# Patient Record
Sex: Male | Born: 1965 | Race: White | Hispanic: No | Marital: Married | Smoking: Never smoker
Health system: Southern US, Community
[De-identification: ages and names within clinical notes are randomized; demographics above are authoritative.]

## PROBLEM LIST (undated history)

## (undated) DIAGNOSIS — D860 Sarcoidosis of lung: Secondary | ICD-10-CM

## (undated) DIAGNOSIS — I472 Ventricular tachycardia, unspecified: Secondary | ICD-10-CM

## (undated) DIAGNOSIS — D8685 Sarcoid myocarditis: Secondary | ICD-10-CM

## (undated) DIAGNOSIS — I469 Cardiac arrest, cause unspecified: Secondary | ICD-10-CM

## (undated) DIAGNOSIS — I442 Atrioventricular block, complete: Secondary | ICD-10-CM

## (undated) DIAGNOSIS — I4510 Unspecified right bundle-branch block: Secondary | ICD-10-CM

## (undated) DIAGNOSIS — N289 Disorder of kidney and ureter, unspecified: Secondary | ICD-10-CM

## (undated) DIAGNOSIS — K219 Gastro-esophageal reflux disease without esophagitis: Secondary | ICD-10-CM

## (undated) DIAGNOSIS — I428 Other cardiomyopathies: Secondary | ICD-10-CM

## (undated) HISTORY — DX: Disorder of kidney and ureter, unspecified: N28.9

## (undated) HISTORY — DX: Unspecified right bundle-branch block: I45.10

---

## 1998-04-14 ENCOUNTER — Encounter: Payer: Self-pay | Admitting: Emergency Medicine

## 1998-04-14 ENCOUNTER — Emergency Department (HOSPITAL_COMMUNITY): Admission: EM | Admit: 1998-04-14 | Discharge: 1998-04-14 | Payer: Self-pay | Admitting: Emergency Medicine

## 2008-07-11 ENCOUNTER — Emergency Department (HOSPITAL_BASED_OUTPATIENT_CLINIC_OR_DEPARTMENT_OTHER): Admission: EM | Admit: 2008-07-11 | Discharge: 2008-07-11 | Payer: Self-pay | Admitting: Emergency Medicine

## 2008-07-11 ENCOUNTER — Ambulatory Visit: Payer: Self-pay | Admitting: Diagnostic Radiology

## 2010-06-15 LAB — URINALYSIS, ROUTINE W REFLEX MICROSCOPIC
Bilirubin Urine: NEGATIVE
Glucose, UA: NEGATIVE mg/dL
Hgb urine dipstick: NEGATIVE
Ketones, ur: NEGATIVE mg/dL
Leukocytes, UA: NEGATIVE
Nitrite: NEGATIVE
Protein, ur: NEGATIVE mg/dL
Specific Gravity, Urine: 1.024 (ref 1.005–1.030)
Urobilinogen, UA: 0.2 mg/dL (ref 0.0–1.0)
pH: 5 (ref 5.0–8.0)

## 2014-03-11 ENCOUNTER — Inpatient Hospital Stay (HOSPITAL_COMMUNITY)
Admission: EM | Admit: 2014-03-11 | Discharge: 2014-03-20 | DRG: 224 | Disposition: A | Discharge status: Home / Self Care | Payer: 59 | Attending: Internal Medicine | Admitting: Internal Medicine

## 2014-03-11 DIAGNOSIS — Z95810 Presence of automatic (implantable) cardiac defibrillator: Secondary | ICD-10-CM

## 2014-03-11 DIAGNOSIS — D869 Sarcoidosis, unspecified: Secondary | ICD-10-CM

## 2014-03-11 DIAGNOSIS — I5021 Acute systolic (congestive) heart failure: Secondary | ICD-10-CM | POA: Diagnosis present

## 2014-03-11 DIAGNOSIS — I213 ST elevation (STEMI) myocardial infarction of unspecified site: Secondary | ICD-10-CM | POA: Diagnosis present

## 2014-03-11 DIAGNOSIS — J189 Pneumonia, unspecified organism: Secondary | ICD-10-CM | POA: Diagnosis present

## 2014-03-11 DIAGNOSIS — I428 Other cardiomyopathies: Secondary | ICD-10-CM

## 2014-03-11 DIAGNOSIS — J9690 Respiratory failure, unspecified, unspecified whether with hypoxia or hypercapnia: Secondary | ICD-10-CM

## 2014-03-11 DIAGNOSIS — R739 Hyperglycemia, unspecified: Secondary | ICD-10-CM | POA: Diagnosis present

## 2014-03-11 DIAGNOSIS — Z8674 Personal history of sudden cardiac arrest: Secondary | ICD-10-CM

## 2014-03-11 DIAGNOSIS — Z452 Encounter for adjustment and management of vascular access device: Secondary | ICD-10-CM

## 2014-03-11 DIAGNOSIS — I469 Cardiac arrest, cause unspecified: Secondary | ICD-10-CM | POA: Diagnosis not present

## 2014-03-11 DIAGNOSIS — D696 Thrombocytopenia, unspecified: Secondary | ICD-10-CM | POA: Diagnosis present

## 2014-03-11 DIAGNOSIS — E874 Mixed disorder of acid-base balance: Secondary | ICD-10-CM | POA: Diagnosis present

## 2014-03-11 DIAGNOSIS — I4510 Unspecified right bundle-branch block: Secondary | ICD-10-CM | POA: Diagnosis present

## 2014-03-11 DIAGNOSIS — G9341 Metabolic encephalopathy: Secondary | ICD-10-CM | POA: Diagnosis present

## 2014-03-11 DIAGNOSIS — K219 Gastro-esophageal reflux disease without esophagitis: Secondary | ICD-10-CM | POA: Diagnosis present

## 2014-03-11 DIAGNOSIS — R1310 Dysphagia, unspecified: Secondary | ICD-10-CM | POA: Diagnosis not present

## 2014-03-11 DIAGNOSIS — F419 Anxiety disorder, unspecified: Secondary | ICD-10-CM | POA: Diagnosis present

## 2014-03-11 DIAGNOSIS — I420 Dilated cardiomyopathy: Secondary | ICD-10-CM | POA: Diagnosis present

## 2014-03-11 DIAGNOSIS — Z823 Family history of stroke: Secondary | ICD-10-CM

## 2014-03-11 DIAGNOSIS — J9811 Atelectasis: Secondary | ICD-10-CM | POA: Diagnosis present

## 2014-03-11 DIAGNOSIS — T380X5A Adverse effect of glucocorticoids and synthetic analogues, initial encounter: Secondary | ICD-10-CM | POA: Diagnosis present

## 2014-03-11 DIAGNOSIS — I442 Atrioventricular block, complete: Secondary | ICD-10-CM | POA: Diagnosis present

## 2014-03-11 DIAGNOSIS — K5900 Constipation, unspecified: Secondary | ICD-10-CM | POA: Diagnosis not present

## 2014-03-11 DIAGNOSIS — E876 Hypokalemia: Secondary | ICD-10-CM | POA: Diagnosis not present

## 2014-03-11 DIAGNOSIS — E871 Hypo-osmolality and hyponatremia: Secondary | ICD-10-CM | POA: Diagnosis not present

## 2014-03-11 DIAGNOSIS — G9340 Encephalopathy, unspecified: Secondary | ICD-10-CM | POA: Insufficient documentation

## 2014-03-11 DIAGNOSIS — J9601 Acute respiratory failure with hypoxia: Secondary | ICD-10-CM | POA: Diagnosis present

## 2014-03-11 DIAGNOSIS — N179 Acute kidney failure, unspecified: Secondary | ICD-10-CM | POA: Diagnosis present

## 2014-03-11 DIAGNOSIS — D860 Sarcoidosis of lung: Secondary | ICD-10-CM | POA: Diagnosis present

## 2014-03-11 DIAGNOSIS — I472 Ventricular tachycardia, unspecified: Secondary | ICD-10-CM

## 2014-03-11 DIAGNOSIS — Z4659 Encounter for fitting and adjustment of other gastrointestinal appliance and device: Secondary | ICD-10-CM

## 2014-03-11 DIAGNOSIS — D8685 Sarcoid myocarditis: Secondary | ICD-10-CM | POA: Diagnosis present

## 2014-03-11 HISTORY — DX: Ventricular tachycardia: I47.2

## 2014-03-11 HISTORY — DX: Sarcoid myocarditis: D86.85

## 2014-03-11 HISTORY — DX: Sarcoidosis of lung: D86.0

## 2014-03-11 HISTORY — DX: Atrioventricular block, complete: I44.2

## 2014-03-11 HISTORY — DX: Ventricular tachycardia, unspecified: I47.20

## 2014-03-11 HISTORY — DX: Other cardiomyopathies: I42.8

## 2014-03-11 HISTORY — DX: Cardiac arrest, cause unspecified: I46.9

## 2014-03-11 HISTORY — DX: Gastro-esophageal reflux disease without esophagitis: K21.9

## 2014-03-11 LAB — I-STAT CG4 LACTIC ACID, ED: LACTIC ACID, VENOUS: 4.33 mmol/L — AB (ref 0.5–2.2)

## 2014-03-11 LAB — CBC WITH DIFFERENTIAL/PLATELET
Basophils Absolute: 0.1 10*3/uL (ref 0.0–0.1)
Basophils Relative: 1 % (ref 0–1)
Eosinophils Absolute: 0.2 10*3/uL (ref 0.0–0.7)
Eosinophils Relative: 2 % (ref 0–5)
HEMATOCRIT: 43.2 % (ref 39.0–52.0)
HEMOGLOBIN: 14.4 g/dL (ref 13.0–17.0)
LYMPHS PCT: 16 % (ref 12–46)
Lymphs Abs: 2 10*3/uL (ref 0.7–4.0)
MCH: 29.6 pg (ref 26.0–34.0)
MCHC: 33.3 g/dL (ref 30.0–36.0)
MCV: 88.9 fL (ref 78.0–100.0)
Monocytes Absolute: 0.7 10*3/uL (ref 0.1–1.0)
Monocytes Relative: 5 % (ref 3–12)
Neutro Abs: 10.1 10*3/uL — ABNORMAL HIGH (ref 1.7–7.7)
Neutrophils Relative %: 76 % (ref 43–77)
Platelets: 189 10*3/uL (ref 150–400)
RBC: 4.86 MIL/uL (ref 4.22–5.81)
RDW: 13.1 % (ref 11.5–15.5)
WBC: 13 10*3/uL — ABNORMAL HIGH (ref 4.0–10.5)

## 2014-03-11 MED ORDER — AMIODARONE HCL IN DEXTROSE 360-4.14 MG/200ML-% IV SOLN
INTRAVENOUS | Status: AC
  Administered 2014-03-12: 200 mL via INTRAVENOUS
  Filled 2014-03-11: qty 200

## 2014-03-11 MED ORDER — ETOMIDATE 2 MG/ML IV SOLN
INTRAVENOUS | Status: AC | PRN
  Administered 2014-03-11: 30 mg via INTRAVENOUS

## 2014-03-11 MED ORDER — ROCURONIUM BROMIDE 50 MG/5ML IV SOLN
INTRAVENOUS | Status: AC | PRN
  Administered 2014-03-11: 100 mg via INTRAVENOUS

## 2014-03-11 MED ORDER — MAGNESIUM SULFATE 2 GM/50ML IV SOLN
2.0000 g | Freq: Once | INTRAVENOUS | Status: DC
Start: 2014-03-12 — End: 2014-03-12

## 2014-03-11 NOTE — ED Notes (Signed)
Pt being bagged at this time.  Pt remains unresponsive  Amino 150mg  given at this time

## 2014-03-11 NOTE — ED Notes (Signed)
Pt having runs of VT per cardiac monitor Endo tube advanced to 25 at lips per resp. Tech. Verbal order by Dr. Preston Fleeting

## 2014-03-11 NOTE — ED Notes (Signed)
Magnesium 2g IV given

## 2014-03-11 NOTE — Consult Note (Signed)
CARDIOLOGY CONSULT NOTE   Patient ID: Christopher Patrick MRN: [id number], DOB/AGE: 06-18-1965   Admit date: 03/11/2014 Date of Consult: 03/11/2014   Primary Physician: No PCP Per Patient Primary Cardiologist: None  Pt. Profile  [name] with anxiety and history of biopsy proven sarcoid and GERD who presents s/p cardiac arrest.   Problem List GERD Sarcoid  Anxiety  Allergies None  HPI   [name] with anxiety and history of biopsy proven sarcoid and GERD who presents s/p cardiac arrest. Christopher Patrick was in his USOH until a day ago when he developed episodes of hand tingling and heart racing.  Today, he generally felt well until 9:30pm when he was seated on the couch and he told his wife "I don't feel like I have control."  Shortly after, he had LOC.  No trauma as he was already on the couch. Wife noticed abnormal breathing and called EMS. A neighbor who was a Theatre stage manager heard the call and came over. He had a rapid pulse at that point.  After about 6 minutes, EMS arrived and shortly after he became apneic and pulseless and CPR was started immediately.  He underwent CPR for 20 minutes and required 3 shocks; he was given 2mg  of epi and 150mg  of IV amiodarone. He was transferred to [facility] ED with a king airway.   On arrival to the ED, he was hemodynamically stable with pulse 92, BP 111/72, saturating 100%.  King airway switched for ETT. Initial labs notable for K 4.1, TnI 0.08, lactate 4.33. He had recurrent runs of VT which was treated with another 150mg  IV amiodarone bolus.  Initial ECG with substantial ventricular ectopy without STE. A follow-up ECG at 00:11 demonstrated marked 1st degree HB ( ), RBBB, LPFB. There was also evidence of more advanced AV block -- there is a non-conducted P wave followed by a junctional beat (2nd to last QRS complex). Due to recurrent runs of VT in the ED (none of which required shock), I recommended that lidocaine be started and this resulted in a significant decrease  in VT initially.  He has no CV history. Wife is not aware of cardiac sarcoid; she is not aware of any sarcoid treatment history or specific organ involvement except for apparently lung (?), but states that he has periodically had SOB. No personal history of syncope or presyncope. At least two paternal uncles died suddenly in their 33s of unknown causes. No illicits.  Inpatient Medications     Family History No family history on file.   Social History History   Social History  . Marital Status: Married    Spouse Name: N/A    Number of Children: N/A  . Years of Education: N/A   Occupational History  . Not on file.   Social History Main Topics  . Smoking status: Not on file  . Smokeless tobacco: Not on file  . Alcohol Use: Not on file  . Drug Use: Not on file  . Sexual Activity: Not on file   Other Topics Concern  . Not on file   Social History Narrative  . No narrative on file     Review of Systems  Unable to obtain  Physical Exam  Blood pressure 202/186, pulse 54, temperature 97 F (36.1 C), resp. rate 16, weight 99.791 kg (220 lb), SpO2 97 %.  General: Intubated, not responding Psych: Obtunded Neuro: Obtunded. No spontaneous movement. PERRL HEENT: ETT. Some blood around ETT.  Neck: Supple without bruits or JVD. Lungs:  Coarse BS anteriorly Heart: RRR no s3, s4, or murmurs. Abdomen: Soft, non-tender, non-distended, BS + x 4.  Extremities: No clubbing, cyanosis or edema. DP/PT/Radials 2+ and equal bilaterally. Cool  Labs   Recent Labs  03/11/14 2337  TROPONINI 0.08*   Lab Results  Component Value Date   WBC 13.0* 03/11/2014   HGB 14.4 03/11/2014   HCT 43.2 03/11/2014   MCV 88.9 03/11/2014   PLT 189 03/11/2014   No results for input(s): NA, K, CL, CO2, BUN, CREATININE, CALCIUM, PROT, BILITOT, ALKPHOS, ALT, AST, GLUCOSE in the last 168 hours.  Invalid input(s): LABALBU No results found for: CHOL, HDL, LDLCALC, TRIG No results found for:  DDIMER  Radiology/Studies  Dg Chest Port 1 View  03/12/2014   CLINICAL DATA:  Post CPR to assess endotracheal tube.  EXAM: PORTABLE CHEST - 1 VIEW  COMPARISON:  None.  FINDINGS: Two films were obtained, initial image demonstrating endotracheal tube tip 9 cm above the carina and second image demonstrating endotracheal tube tip 3.9 cm above the carina. Enteric tube tip is at the level of the EG junction with proximal side hole probably in the lower esophagus. Shallow inspiration. Cardiac enlargement with normal pulmonary vascularity. Focal opacity in the left mid lung consistent with infiltration or atelectasis. No blunting of costophrenic angles. No pneumothorax. Nodule in the right mid lung probably represents a calcified granuloma.  IMPRESSION: Endotracheal tube appears to be in good position. Enteric tube is somewhat shallow with proximal side hole probably in the distal esophageal region. Cardiac enlargement. Infiltration or atelectasis in the left mid lung.   Electronically Signed   By: Burman Nieves M.D.   On: 03/12/2014 00:13    ECG  00:11 marked 1st degree HB ( ) , RBBB, LPFB, PVC. There is a also evidence of more advanced AV block -- there is a non-conducted P wave followed by a junctional beat (2nd to last QRS complex)  ASSESSMENT AND PLAN  [name] with anxiety and history of biopsy proven sarcoid and GERD who presents s/p cardiac arrest due to a ventricular arrhythmia.  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  He may also have coronary artery disease (in combination or as the sole explanation), although he does not have much in the way of risk factors. The history of premature death on the father's side suggests an inherited etiology; this would not be inconsistent with sarcoid, which is known to have some genetic underpinnings.   He is  currently having frequent runs of VT and remains obtunded without any sedation.  Although prognosis remains guarded, his age and early bystander CPR are favorable prognostic indicators.   1. Cooling protocol per CCM 2. Continue amiodarone and lidocaine drips for VT. I would sedate the patient heavily to remove adrenergic tone which is likely driving these residual runs of VT. I am hesitant to recommend a beta blocker given the brief periods of complete heart block.  Since amiodarone is more likely to affect AV conduction than lidocaine, this should be weaned from 1 to 0.5 if possible (after 6 hours and with complete sedation). If refractory VT, he will require cardiac cath +/- IABP tonight. K>4, Mg>2. 3. TTE in AM 4. Trend cardiac biomarkers 5. Hold off on heparin for now given oropharyngeal bleeding and suspicion for cardiac sarcoid rather than ACS. Would give full dose ASA and baby aspirin.  Hold statin for now given possibility of shock liver in setting of cardiac arrest. 6. Will eventually require a cath +/- ICD depending on recovery and clinical course   Signed, Glori Luis, MD 03/11/2014, 11:53 PM

## 2014-03-11 NOTE — ED Provider Notes (Signed)
CSN: [id number]     Arrival date & time 03/11/14  2324 History  This chart was scribed for Dione Booze, MD by Richarda Overlie, ED Scribe. This patient was seen in room [facility] and the patient's care was started 11:23 PM.    Chief Complaint  Patient presents with  . Code STEMI   LEVEL 5 CAVEAT - Intubated  The history is provided by the EMS personnel. The history is limited by the condition of the patient (Intubated and unresponsive). No language interpreter was used.   HPI Comments: Christopher Patrick is a 49 y.o. male who presents to the Emergency Department with a post arrest STEMI that about 1 hour ago. Per EMS, his wife stepped out of the room and when she returned he was on the floor. They state that pt had been working outside all day today. EMS reports that they conducted CPR for approximately 15 minutes. Pt was shocked by defibrillator a total of 3 times. EMS reports they administered epi two times. On arrival to ED pt being bagged via king airway. Per EMS pt is in sinus arhythmia currently.   No past medical history on file. No past surgical history on file. No family history on file. History  Substance Use Topics  . Smoking status: Not on file  . Smokeless tobacco: Not on file  . Alcohol Use: Not on file    Review of Systems  Unable to perform ROS: Intubated    Allergies  Review of patient's allergies indicates not on file.  Home Medications   Prior to Admission medications   Not on File   BP 111/72 mmHg  Resp 19  SpO2 100% Physical Exam   49 year old male, unresponsive and intubated with a King airway. Vital signs are normal. Oxygen saturation is 100%, which is normal. Head is normocephalic. King airway is in place and blood is coming from the mouth.  pupils are midposition and fixed. Neck has no  adenopathy or JVD. have symmetric air movement. Chehas no deformity or crepitus . Heart has regular rate and rhythm without murmur. Abdomen is soft, flat,  without masses or hepatosplenomegaly. Extremities have no cyanosis or edema. Intraosseous line is present in the left proximal tibia. Skin is warm and dry without rash. Neurologic: GCS=4 - he shows some decerebrate posturing and some shivering and no other neurologic signs of life.  ED Course  Procedures  DIAGNOSTIC STUDIES: Oxygen Saturation is 100% on bag via king airway, adequate by my interpretation.    COORDINATION OF CARE: 11:44 PM Discussed treatment plan with pt at bedside and pt agreed to plan.   King airway was replaced by endotracheal tube. This was done by emergency medicine resident with under my direct supervision. I was at the bedside for the entire procedure.  Labs Review Results for orders placed or performed during the hospital encounter of 03/11/14  MRSA PCR Screening  Result Value Ref Range   MRSA by PCR NEGATIVE NEGATIVE  Basic metabolic panel  Result Value Ref Range   Sodium 140 135 - 145 mmol/L   Potassium 4.1 3.5 - 5.1 mmol/L   Chloride 107 96 - 112 mEq/L   CO2 22 19 - 32 mmol/L   Glucose, Bld 173 (H) 70 - 99 mg/dL   BUN 15 6 - 23 mg/dL   Creatinine, Ser 4.09 (H) 0.50 - 1.35 mg/dL   Calcium 8.8 8.4 - 81.1 mg/dL   GFR calc non Af Amer 53 (L) >90 mL/min  GFR calc Af Amer 61 (L) >90 mL/min   Anion gap 11 5 - 15  Troponin I  Result Value Ref Range   Troponin I 0.08 (H) <0.031 ng/mL  CBC with Differential  Result Value Ref Range   WBC 13.0 (H) 4.0 - 10.5 K/uL   RBC 4.86 4.22 - 5.81 MIL/uL   Hemoglobin 14.4 13.0 - 17.0 g/dL   HCT 96.0 45.4 - 09.8 %   MCV 88.9 78.0 - 100.0 fL   MCH 29.6 26.0 - 34.0 pg   MCHC 33.3 30.0 - 36.0 g/dL   RDW 11.9 14.7 - 82.9 %   Platelets 189 150 - 400 K/uL   Neutrophils Relative % 76 43 - 77 %   Neutro Abs 10.1 (H) 1.7 - 7.7 K/uL   Lymphocytes Relative 16 12 - 46 %   Lymphs Abs 2.0 0.7 - 4.0 K/uL   Monocytes Relative 5 3 - 12 %   Monocytes Absolute 0.7 0.1 - 1.0 K/uL   Eosinophils Relative 2 0 - 5 %   Eosinophils  Absolute 0.2 0.0 - 0.7 K/uL   Basophils Relative 1 0 - 1 %   Basophils Absolute 0.1 0.0 - 0.1 K/uL  Magnesium  Result Value Ref Range   Magnesium 2.0 1.5 - 2.5 mg/dL  Urinalysis, Routine w reflex microscopic  Result Value Ref Range   Color, Urine YELLOW YELLOW   APPearance CLOUDY (A) CLEAR   Specific Gravity, Urine 1.011 1.005 - 1.030   pH 6.5 5.0 - 8.0   Glucose, UA NEGATIVE NEGATIVE mg/dL   Hgb urine dipstick SMALL (A) NEGATIVE   Bilirubin Urine NEGATIVE NEGATIVE   Ketones, ur NEGATIVE NEGATIVE mg/dL   Protein, ur 562 (A) NEGATIVE mg/dL   Urobilinogen, UA 0.2 0.0 - 1.0 mg/dL   Nitrite NEGATIVE NEGATIVE   Leukocytes, UA NEGATIVE NEGATIVE  Urine microscopic-add on  Result Value Ref Range   Squamous Epithelial / LPF RARE RARE   WBC, UA 0-2 <3 WBC/hpf   RBC / HPF 3-6 <3 RBC/hpf   Bacteria, UA FEW (A) RARE   Casts GRANULAR CAST (A) NEGATIVE   Urine-Other SPERM PRESENT   Troponin I  Result Value Ref Range   Troponin I 0.44 (H) <0.031 ng/mL  Protime-INR now and repeat in 8 hours  Result Value Ref Range   Prothrombin Time 15.1 11.6 - 15.2 seconds   INR 1.17 0.00 - 1.49  APTT now and repeat in 8 hours  Result Value Ref Range   aPTT 34 24 - 37 seconds  Basic metabolic panel  Result Value Ref Range   Sodium 139 135 - 145 mmol/L   Potassium 3.2 (L) 3.5 - 5.1 mmol/L   Chloride 106 96 - 112 mEq/L   CO2 21 19 - 32 mmol/L   Glucose, Bld 204 (H) 70 - 99 mg/dL   BUN 14 6 - 23 mg/dL   Creatinine, Ser 1.30 0.50 - 1.35 mg/dL   Calcium 8.3 (L) 8.4 - 10.5 mg/dL   GFR calc non Af Amer 82 (L) >90 mL/min   GFR calc Af Amer >90 >90 mL/min   Anion gap 12 5 - 15  Basic metabolic panel  Result Value Ref Range   Sodium 138 135 - 145 mmol/L   Potassium 3.2 (L) 3.5 - 5.1 mmol/L   Chloride 107 96 - 112 mEq/L   CO2 19 19 - 32 mmol/L   Glucose, Bld 209 (H) 70 - 99 mg/dL   BUN 14 6 - 23  mg/dL   Creatinine, Ser 8.50 0.50 - 1.35 mg/dL   Calcium 8.4 8.4 - 27.7 mg/dL   GFR calc non Af Amer 82  (L) >90 mL/min   GFR calc Af Amer >90 >90 mL/min   Anion gap 12 5 - 15  Basic metabolic panel  Result Value Ref Range   Sodium 136 135 - 145 mmol/L   Potassium 3.8 3.5 - 5.1 mmol/L   Chloride 105 96 - 112 mEq/L   CO2 20 19 - 32 mmol/L   Glucose, Bld 187 (H) 70 - 99 mg/dL   BUN 14 6 - 23 mg/dL   Creatinine, Ser 4.12 0.50 - 1.35 mg/dL   Calcium 8.1 (L) 8.4 - 10.5 mg/dL   GFR calc non Af Amer 76 (L) >90 mL/min   GFR calc Af Amer 88 (L) >90 mL/min   Anion gap 11 5 - 15  Lactic acid, plasma  Result Value Ref Range   Lactic Acid, Venous 2.6 (H) 0.5 - 2.2 mmol/L  Glucose, capillary  Result Value Ref Range   Glucose-Capillary 174 (H) 70 - 99 mg/dL   Comment 1 Capillary Sample   Glucose, capillary  Result Value Ref Range   Glucose-Capillary 192 (H) 70 - 99 mg/dL   Comment 1 Arterial Sample   Glucose, capillary  Result Value Ref Range   Glucose-Capillary 210 (H) 70 - 99 mg/dL   Comment 1 Arterial Sample   Glucose, capillary  Result Value Ref Range   Glucose-Capillary 180 (H) 70 - 99 mg/dL   Comment 1 Arterial Sample   Blood gas, arterial  Result Value Ref Range   FIO2 1.00 %   Delivery systems VENTILATOR    Mode PRESSURE REGULATED VOLUME CONTROL    VT 620 mL   Rate 20 resp/min   Peep/cpap 5.0 cm H20   pH, Arterial 7.449 7.350 - 7.450   pCO2 arterial 31.5 (L) 35.0 - 45.0 mmHg   pO2, Arterial 222.0 (H) 80.0 - 100.0 mmHg   Bicarbonate 22.0 20.0 - 24.0 mEq/L   TCO2 23.0 0 - 100 mmol/L   Acid-base deficit 1.8 0.0 - 2.0 mmol/L   O2 Saturation 99.7 %   Patient temperature 95.5    Collection site ARTERIAL LINE    Drawn by [id number]    Sample type ARTERIAL DRAW   I-Stat CG4 Lactic Acid, ED  Result Value Ref Range   Lactic Acid, Venous 4.33 (H) 0.5 - 2.2 mmol/L  I-Stat arterial blood gas, ED  Result Value Ref Range   pH, Arterial 7.308 (L) 7.350 - 7.450   pCO2 arterial 47.9 (H) 35.0 - 45.0 mmHg   pO2, Arterial 82.0 80.0 - 100.0 mmHg   Bicarbonate 24.1 (H) 20.0 - 24.0 mEq/L    TCO2 26 0 - 100 mmol/L   O2 Saturation 95.0 %   Acid-base deficit 3.0 (H) 0.0 - 2.0 mmol/L   Patient temperature 97.9 F    Collection site RADIAL, ALLEN'S TEST ACCEPTABLE    Drawn by RT    Sample type ARTERIAL   I-STAT 3, arterial blood gas (G3+)  Result Value Ref Range   pH, Arterial 7.343 (L) 7.350 - 7.450   pCO2 arterial 35.3 35.0 - 45.0 mmHg   pO2, Arterial 63.0 (L) 80.0 - 100.0 mmHg   Bicarbonate 19.6 (L) 20.0 - 24.0 mEq/L   TCO2 21 0 - 100 mmol/L   O2 Saturation 93.0 %   Acid-base deficit 6.0 (H) 0.0 - 2.0 mmol/L   Patient temperature 95.5 F  Collection site RADIAL, ALLEN'S TEST ACCEPTABLE    Drawn by Operator    Sample type ARTERIAL     Imaging Review Dg Chest Port 1 View  03/12/2014   CLINICAL DATA:  Central line placement.  EXAM: PORTABLE CHEST - 1 VIEW  COMPARISON:  03/11/2014  FINDINGS: Endotracheal tube tip measures 5.3 cm above the carinal. Enteric tube tip projects over the EG junction. Interval placement of a left central venous catheter with tip over the cavoatrial junction. No pneumothorax. Persistent cardiac enlargement and perihilar infiltrates.  IMPRESSION: Left central venous catheter with tip over the cavoatrial junction. No pneumothorax. Enteric tube tip remains at the level of the EG junction. Cardiac enlargement. Bilateral perihilar infiltrates.   Electronically Signed   By: Burman Nieves M.D.   On: 03/12/2014 03:23   Dg Chest Port 1 View  03/12/2014   CLINICAL DATA:  Post CPR to assess endotracheal tube.  EXAM: PORTABLE CHEST - 1 VIEW  COMPARISON:  None.  FINDINGS: Two films were obtained, initial image demonstrating endotracheal tube tip 9 cm above the carina and second image demonstrating endotracheal tube tip 3.9 cm above the carina. Enteric tube tip is at the level of the EG junction with proximal side hole probably in the lower esophagus. Shallow inspiration. Cardiac enlargement with normal pulmonary vascularity. Focal opacity in the left mid lung  consistent with infiltration or atelectasis. No blunting of costophrenic angles. No pneumothorax. Nodule in the right mid lung probably represents a calcified granuloma.  IMPRESSION: Endotracheal tube appears to be in good position. Enteric tube is somewhat shallow with proximal side hole probably in the distal esophageal region. Cardiac enlargement. Infiltration or atelectasis in the left mid lung.   Electronically Signed   By: Burman Nieves M.D.   On: 03/12/2014 00:13   Dg Abd Portable 1v  03/12/2014   CLINICAL DATA:  OG placement.  EXAM: PORTABLE ABDOMEN - 1 VIEW  COMPARISON:  None.  FINDINGS: Enteric tube tip projects over the EG junction region with proximal side hole projecting over the lower esophagus. Cardiac enlargement. Infiltrates noted in the lower lungs.  IMPRESSION: Enteric tube tip projects over the EG junction with proximal side hole probably in the lower esophagus.   Electronically Signed   By: Burman Nieves M.D.   On: 03/12/2014 03:25   Ct Portable Head W/o Cm  03/12/2014   CLINICAL DATA:  Status post CPR, cardiac arrest. Acute encephalopathy.  EXAM: CT HEAD WITHOUT CONTRAST  TECHNIQUE: Contiguous axial images were obtained from the base of the skull through the vertex without intravenous contrast.  COMPARISON:  None.  FINDINGS: The ventricles and sulci are normal. Somewhat poor gray-white matter differentiation though, there is streak artifact from life support lines. No intraparenchymal hemorrhage, mass effect nor midline shift. No acute large vascular territory infarcts.  No abnormal extra-axial fluid collections. Basal cisterns are patent.  No skull fracture. The included ocular globes and orbital contents are non-suspicious. Bilateral maxillary mucosal retention cyst, moderate paranasal sinusitis. Mastoid air cells are well aerated.  IMPRESSION: Mild loss of the expected gray-white matter differentiation, this could be artifact or reflect very early global brain edema/ anoxia.  Recommend followup.   Electronically Signed   By: Awilda Metro   On: 03/12/2014 06:00   Images viewed by me.   EKG Interpretation   Date/Time:  Tuesday March 11 2014 23:29:30 EST Ventricular Rate:  96 PR Interval:    QRS Duration: 162 QT Interval:  545 QTC Calculation: 689 R Axis:  142 Text Interpretation:  Atrial fibrillation Ventricular tachycardia,  unsustained Nonspecific intraventricular conduction delay Minimal ST  depression Right bundle branch block No old tracing to compare Confirmed  by [facility]  MD, Shiah Berhow ([id number]) on 03/12/2014 8:04:46 AM       EKG Interpretation   Date/Time:  Wednesday March 12 2014 00:11:00 EST Ventricular Rate:  65 PR Interval:  317 QRS Duration: 149 QT Interval:  417 QTC Calculation: 434 R Axis:   125 Text Interpretation:  Sinus or ectopic atrial rhythm Prolonged PR interval  Nonspecific intraventricular conduction delay Nonspecific ST depression ST  depression V1-V3, suggest recording posterior leads When compared with ECG  of 03/11/2014, Left posterior fasicular block is now Present Confirmed by  [facility]  MD, Kristofer Schaffert ([id number]) on 03/12/2014 8:07:13 AM      Medical screening examination/treatment/procedure(s) were conducted as a shared visit with non-physician practitioner(s) and myself.  I personally evaluated the patient during the encounter.   EKG Interpretation   Date/Time:  Wednesday March 12 2014 00:11:00 EST Ventricular Rate:  65 PR Interval:  317 QRS Duration: 149 QT Interval:  417 QTC Calculation: 434 R Axis:   125 Text Interpretation:  Sinus or ectopic atrial rhythm Prolonged PR interval  Nonspecific intraventricular conduction delay Nonspecific ST depression ST  depression V1-V3, suggest recording posterior leads When compared with ECG  of 03/11/2014, Left posterior fasicular block is now Present Confirmed by  [facility]  MD, Brodyn Depuy ([id number]) on 03/12/2014 8:07:13 AM       CRITICAL CARE Performed by: BJYNW,GNFAO Total critical  care time: 120 minutes Critical care time was exclusive of separately billable procedures and treating other patients. Critical care was necessary to treat or prevent imminent or life-threatening deterioration. Critical care was time spent personally by me on the following activities: development of treatment plan with patient and/or surrogate as well as nursing, discussions with consultants, evaluation of patient's response to treatment, examination of patient, obtaining history from patient or surrogate, ordering and performing treatments and interventions, ordering and review of laboratory studies, ordering and review of radiographic studies, pulse oximetry and re-evaluation of patient's condition.  MDM   Final diagnoses:  Cardiac arrest  Out of hospital cardiac arrest with respect tenderness spontaneous circulation. EMS had given him to epinephrine and shocked him 3 times and also given amiodarone 150 mg. Total time of CPR was about 20 minutes. In the ED, Brooke Dare airway was pulled out and endotracheal tube placed. He is noted to have runs of sustained and nonsustained ventricular tachycardia. He is given additional amiodarone. ECG shows QT prolongation is given magnesium. Consultation is obtained with cardiology and pulmonary critical care. Cardiology has been in to see the patient and has started him on lidocaine as well as the amiodarone. I have talked with family and apparently he has been having some GI symptoms and was started on a proton pump inhibitor 2 days ago. He had been complaining of some numbness in his arms intermittently over the last one-2 days. There is family history of stroke and aneurysm at about this age but he has no prior history.  Patient continued to have problems with arrhythmia including fairly long runs of ventricular tachycardia. These were generally perfusing and it was elected not to try to convert him electrically. Lidocaine was increased. Cases and managed concurrently  with cardiology and critical care.  I personally performed the services described in this documentation, which was scribed in my presence. The recorded information has been reviewed and is accurate.  Dione Booze, MD 03/12/14 [id number]

## 2014-03-11 NOTE — ED Notes (Signed)
King Airway removed, Pt intubated with #8.0 endo tube

## 2014-03-11 NOTE — ED Notes (Signed)
Pt to ED via [facility] with post arrest stemi pt.  EMS st's wife st's she walked out of room when she returned pt was on floor.  St's wife st's pt had been working outside all day. Pt was given CPR for 15 mins. Pt was shocked x's 2 and given Epi x's 2.  On arrival to ED pt being bagged via king airway.

## 2014-03-12 ENCOUNTER — Inpatient Hospital Stay (HOSPITAL_COMMUNITY): Payer: 59

## 2014-03-12 ENCOUNTER — Emergency Department (HOSPITAL_COMMUNITY): Payer: 59

## 2014-03-12 ENCOUNTER — Encounter (HOSPITAL_COMMUNITY)
Admission: EM | Disposition: A | Discharge status: Home / Self Care | Payer: 59 | Source: Home / Self Care | Attending: Pulmonary Disease

## 2014-03-12 ENCOUNTER — Other Ambulatory Visit (HOSPITAL_COMMUNITY): Payer: 59

## 2014-03-12 ENCOUNTER — Encounter (HOSPITAL_COMMUNITY): Payer: Self-pay | Admitting: Emergency Medicine

## 2014-03-12 DIAGNOSIS — E874 Mixed disorder of acid-base balance: Secondary | ICD-10-CM | POA: Diagnosis present

## 2014-03-12 DIAGNOSIS — J9601 Acute respiratory failure with hypoxia: Secondary | ICD-10-CM

## 2014-03-12 DIAGNOSIS — R739 Hyperglycemia, unspecified: Secondary | ICD-10-CM | POA: Diagnosis present

## 2014-03-12 DIAGNOSIS — D8685 Sarcoid myocarditis: Secondary | ICD-10-CM | POA: Diagnosis present

## 2014-03-12 DIAGNOSIS — I469 Cardiac arrest, cause unspecified: Secondary | ICD-10-CM | POA: Diagnosis present

## 2014-03-12 DIAGNOSIS — F419 Anxiety disorder, unspecified: Secondary | ICD-10-CM | POA: Diagnosis present

## 2014-03-12 DIAGNOSIS — I5021 Acute systolic (congestive) heart failure: Secondary | ICD-10-CM | POA: Diagnosis present

## 2014-03-12 DIAGNOSIS — I428 Other cardiomyopathies: Secondary | ICD-10-CM

## 2014-03-12 DIAGNOSIS — N179 Acute kidney failure, unspecified: Secondary | ICD-10-CM | POA: Diagnosis present

## 2014-03-12 DIAGNOSIS — I472 Ventricular tachycardia, unspecified: Secondary | ICD-10-CM

## 2014-03-12 DIAGNOSIS — E876 Hypokalemia: Secondary | ICD-10-CM | POA: Diagnosis not present

## 2014-03-12 DIAGNOSIS — I4510 Unspecified right bundle-branch block: Secondary | ICD-10-CM | POA: Diagnosis present

## 2014-03-12 DIAGNOSIS — I4589 Other specified conduction disorders: Secondary | ICD-10-CM

## 2014-03-12 DIAGNOSIS — K5900 Constipation, unspecified: Secondary | ICD-10-CM | POA: Diagnosis not present

## 2014-03-12 DIAGNOSIS — D860 Sarcoidosis of lung: Secondary | ICD-10-CM | POA: Diagnosis present

## 2014-03-12 DIAGNOSIS — G931 Anoxic brain damage, not elsewhere classified: Secondary | ICD-10-CM

## 2014-03-12 DIAGNOSIS — J9811 Atelectasis: Secondary | ICD-10-CM | POA: Diagnosis present

## 2014-03-12 DIAGNOSIS — I429 Cardiomyopathy, unspecified: Secondary | ICD-10-CM

## 2014-03-12 DIAGNOSIS — I213 ST elevation (STEMI) myocardial infarction of unspecified site: Secondary | ICD-10-CM | POA: Diagnosis present

## 2014-03-12 DIAGNOSIS — E871 Hypo-osmolality and hyponatremia: Secondary | ICD-10-CM | POA: Diagnosis not present

## 2014-03-12 DIAGNOSIS — R1310 Dysphagia, unspecified: Secondary | ICD-10-CM | POA: Diagnosis not present

## 2014-03-12 DIAGNOSIS — T380X5A Adverse effect of glucocorticoids and synthetic analogues, initial encounter: Secondary | ICD-10-CM | POA: Diagnosis present

## 2014-03-12 DIAGNOSIS — D696 Thrombocytopenia, unspecified: Secondary | ICD-10-CM | POA: Diagnosis present

## 2014-03-12 DIAGNOSIS — Z8674 Personal history of sudden cardiac arrest: Secondary | ICD-10-CM | POA: Diagnosis not present

## 2014-03-12 DIAGNOSIS — D869 Sarcoidosis, unspecified: Secondary | ICD-10-CM

## 2014-03-12 DIAGNOSIS — J189 Pneumonia, unspecified organism: Secondary | ICD-10-CM | POA: Diagnosis present

## 2014-03-12 DIAGNOSIS — I442 Atrioventricular block, complete: Secondary | ICD-10-CM | POA: Diagnosis present

## 2014-03-12 DIAGNOSIS — G9341 Metabolic encephalopathy: Secondary | ICD-10-CM | POA: Diagnosis present

## 2014-03-12 DIAGNOSIS — K219 Gastro-esophageal reflux disease without esophagitis: Secondary | ICD-10-CM | POA: Diagnosis present

## 2014-03-12 DIAGNOSIS — I420 Dilated cardiomyopathy: Secondary | ICD-10-CM | POA: Diagnosis present

## 2014-03-12 DIAGNOSIS — Z823 Family history of stroke: Secondary | ICD-10-CM | POA: Diagnosis not present

## 2014-03-12 HISTORY — PX: LEFT HEART CATHETERIZATION WITH CORONARY ANGIOGRAM: SHX5451

## 2014-03-12 LAB — POCT I-STAT 3, ART BLOOD GAS (G3+)
ACID-BASE DEFICIT: 6 mmol/L — AB (ref 0.0–2.0)
Bicarbonate: 19.6 mEq/L — ABNORMAL LOW (ref 20.0–24.0)
O2 Saturation: 93 %
PO2 ART: 63 mmHg — AB (ref 80.0–100.0)
TCO2: 21 mmol/L (ref 0–100)
pCO2 arterial: 35.3 mmHg (ref 35.0–45.0)
pH, Arterial: 7.343 — ABNORMAL LOW (ref 7.350–7.450)

## 2014-03-12 LAB — POCT I-STAT, CHEM 8
BUN: 20 mg/dL (ref 6–23)
CALCIUM ION: 1.16 mmol/L (ref 1.12–1.23)
CHLORIDE: 101 meq/L (ref 96–112)
Creatinine, Ser: 1.5 mg/dL — ABNORMAL HIGH (ref 0.50–1.35)
Glucose, Bld: 167 mg/dL — ABNORMAL HIGH (ref 70–99)
HCT: 44 % (ref 39.0–52.0)
Hemoglobin: 15 g/dL (ref 13.0–17.0)
POTASSIUM: 3.7 mmol/L (ref 3.5–5.1)
SODIUM: 142 mmol/L (ref 135–145)
TCO2: 21 mmol/L (ref 0–100)

## 2014-03-12 LAB — CBC
HEMATOCRIT: 49.5 % (ref 39.0–52.0)
HEMOGLOBIN: 17.5 g/dL — AB (ref 13.0–17.0)
MCH: 30.4 pg (ref 26.0–34.0)
MCHC: 35.4 g/dL (ref 30.0–36.0)
MCV: 86.1 fL (ref 78.0–100.0)
PLATELETS: 209 10*3/uL (ref 150–400)
RBC: 5.75 MIL/uL (ref 4.22–5.81)
RDW: 13.1 % (ref 11.5–15.5)
WBC: 14.3 10*3/uL — AB (ref 4.0–10.5)

## 2014-03-12 LAB — BASIC METABOLIC PANEL
ANION GAP: 12 (ref 5–15)
ANION GAP: 16 — AB (ref 5–15)
Anion gap: 11 (ref 5–15)
Anion gap: 11 (ref 5–15)
Anion gap: 12 (ref 5–15)
Anion gap: 14 (ref 5–15)
Anion gap: 16 — ABNORMAL HIGH (ref 5–15)
Anion gap: 17 — ABNORMAL HIGH (ref 5–15)
BUN: 14 mg/dL (ref 6–23)
BUN: 14 mg/dL (ref 6–23)
BUN: 14 mg/dL (ref 6–23)
BUN: 15 mg/dL (ref 6–23)
BUN: 15 mg/dL (ref 6–23)
BUN: 16 mg/dL (ref 6–23)
BUN: 18 mg/dL (ref 6–23)
BUN: 18 mg/dL (ref 6–23)
CALCIUM: 8.4 mg/dL (ref 8.4–10.5)
CALCIUM: 8.4 mg/dL (ref 8.4–10.5)
CALCIUM: 7.1 mg/dL — AB (ref 8.4–10.5)
CHLORIDE: 105 meq/L (ref 96–112)
CHLORIDE: 107 meq/L (ref 96–112)
CHLORIDE: 107 meq/L (ref 96–112)
CO2: 12 mmol/L — AB (ref 19–32)
CO2: 14 mmol/L — ABNORMAL LOW (ref 19–32)
CO2: 16 mmol/L — AB (ref 19–32)
CO2: 17 mmol/L — AB (ref 19–32)
CO2: 19 mmol/L (ref 19–32)
CO2: 20 mmol/L (ref 19–32)
CO2: 21 mmol/L (ref 19–32)
CO2: 22 mmol/L (ref 19–32)
CREATININE: 1.04 mg/dL (ref 0.50–1.35)
CREATININE: 1.11 mg/dL (ref 0.50–1.35)
CREATININE: 1.06 mg/dL (ref 0.50–1.35)
CREATININE: 1.26 mg/dL (ref 0.50–1.35)
Calcium: 8.1 mg/dL — ABNORMAL LOW (ref 8.4–10.5)
Calcium: 8.2 mg/dL — ABNORMAL LOW (ref 8.4–10.5)
Calcium: 8.3 mg/dL — ABNORMAL LOW (ref 8.4–10.5)
Calcium: 8.4 mg/dL (ref 8.4–10.5)
Calcium: 8.8 mg/dL (ref 8.4–10.5)
Chloride: 106 mEq/L (ref 96–112)
Chloride: 107 mEq/L (ref 96–112)
Chloride: 110 mEq/L (ref 96–112)
Chloride: 102 mEq/L (ref 96–112)
Chloride: 101 mEq/L (ref 96–112)
Creatinine, Ser: 0.92 mg/dL (ref 0.50–1.35)
Creatinine, Ser: 1.04 mg/dL (ref 0.50–1.35)
Creatinine, Ser: 1.26 mg/dL (ref 0.50–1.35)
Creatinine, Ser: 1.5 mg/dL — ABNORMAL HIGH (ref 0.50–1.35)
GFR calc Af Amer: 61 mL/min — ABNORMAL LOW (ref >90)
GFR calc Af Amer: 75 mL/min — ABNORMAL LOW (ref >90)
GFR calc Af Amer: >90 mL/min (ref >90)
GFR calc non Af Amer: 82 mL/min — ABNORMAL LOW (ref >90)
GFR calc non Af Amer: 82 mL/min — ABNORMAL LOW (ref >90)
GFR calc non Af Amer: 76 mL/min — ABNORMAL LOW (ref >90)
GFR calc non Af Amer: 80 mL/min — ABNORMAL LOW (ref >90)
GFR calc non Af Amer: 65 mL/min — ABNORMAL LOW (ref >90)
GFR, EST AFRICAN AMERICAN: 88 mL/min — AB (ref >90)
GFR, EST AFRICAN AMERICAN: 75 mL/min — AB (ref >90)
GFR, EST NON AFRICAN AMERICAN: 53 mL/min — AB (ref >90)
GFR, EST NON AFRICAN AMERICAN: 65 mL/min — AB (ref >90)
GLUCOSE: 187 mg/dL — AB (ref 70–99)
GLUCOSE: 219 mg/dL — AB (ref 70–99)
GLUCOSE: 241 mg/dL — AB (ref 70–99)
Glucose, Bld: 147 mg/dL — ABNORMAL HIGH (ref 70–99)
Glucose, Bld: 158 mg/dL — ABNORMAL HIGH (ref 70–99)
Glucose, Bld: 173 mg/dL — ABNORMAL HIGH (ref 70–99)
Glucose, Bld: 204 mg/dL — ABNORMAL HIGH (ref 70–99)
Glucose, Bld: 209 mg/dL — ABNORMAL HIGH (ref 70–99)
POTASSIUM: 3.2 mmol/L — AB (ref 3.5–5.1)
POTASSIUM: 3.8 mmol/L (ref 3.5–5.1)
Potassium: 3.2 mmol/L — ABNORMAL LOW (ref 3.5–5.1)
Potassium: 3.2 mmol/L — ABNORMAL LOW (ref 3.5–5.1)
Potassium: 3.5 mmol/L (ref 3.5–5.1)
Potassium: 3.7 mmol/L (ref 3.5–5.1)
Potassium: 3.8 mmol/L (ref 3.5–5.1)
Potassium: 4.1 mmol/L (ref 3.5–5.1)
SODIUM: 138 mmol/L (ref 135–145)
SODIUM: 134 mmol/L — AB (ref 135–145)
Sodium: 132 mmol/L — ABNORMAL LOW (ref 135–145)
Sodium: 136 mmol/L (ref 135–145)
Sodium: 138 mmol/L (ref 135–145)
Sodium: 138 mmol/L (ref 135–145)
Sodium: 139 mmol/L (ref 135–145)
Sodium: 140 mmol/L (ref 135–145)

## 2014-03-12 LAB — BLOOD GAS, ARTERIAL
ACID-BASE DEFICIT: 1.8 mmol/L (ref 0.0–2.0)
BICARBONATE: 22 meq/L (ref 20.0–24.0)
DRAWN BY: 430981
FIO2: 1 %
LHR: 20 {breaths}/min
MECHVT: 620 mL
O2 SAT: 99.7 %
PATIENT TEMPERATURE: 95.5
PCO2 ART: 31.5 mmHg — AB (ref 35.0–45.0)
PEEP: 5 cmH2O
TCO2: 23 mmol/L (ref 0–100)
pH, Arterial: 7.449 (ref 7.350–7.450)
pO2, Arterial: 222 mmHg — ABNORMAL HIGH (ref 80.0–100.0)

## 2014-03-12 LAB — GLUCOSE, CAPILLARY
GLUCOSE-CAPILLARY: 192 mg/dL — AB (ref 70–99)
GLUCOSE-CAPILLARY: 180 mg/dL — AB (ref 70–99)
GLUCOSE-CAPILLARY: 181 mg/dL — AB (ref 70–99)
GLUCOSE-CAPILLARY: 134 mg/dL — AB (ref 70–99)
GLUCOSE-CAPILLARY: 134 mg/dL — AB (ref 70–99)
GLUCOSE-CAPILLARY: 151 mg/dL — AB (ref 70–99)
GLUCOSE-CAPILLARY: 158 mg/dL — AB (ref 70–99)
Glucose-Capillary: 141 mg/dL — ABNORMAL HIGH (ref 70–99)
Glucose-Capillary: 153 mg/dL — ABNORMAL HIGH (ref 70–99)
Glucose-Capillary: 168 mg/dL — ABNORMAL HIGH (ref 70–99)
Glucose-Capillary: 174 mg/dL — ABNORMAL HIGH (ref 70–99)
Glucose-Capillary: 191 mg/dL — ABNORMAL HIGH (ref 70–99)
Glucose-Capillary: 203 mg/dL — ABNORMAL HIGH (ref 70–99)
Glucose-Capillary: 210 mg/dL — ABNORMAL HIGH (ref 70–99)
Glucose-Capillary: 219 mg/dL — ABNORMAL HIGH (ref 70–99)
Glucose-Capillary: 235 mg/dL — ABNORMAL HIGH (ref 70–99)

## 2014-03-12 LAB — I-STAT ARTERIAL BLOOD GAS, ED
Acid-base deficit: 3 mmol/L — ABNORMAL HIGH (ref 0.0–2.0)
Bicarbonate: 24.1 mEq/L — ABNORMAL HIGH (ref 20.0–24.0)
O2 Saturation: 95 %
PCO2 ART: 47.9 mmHg — AB (ref 35.0–45.0)
PH ART: 7.308 — AB (ref 7.350–7.450)
PO2 ART: 82 mmHg (ref 80.0–100.0)
TCO2: 26 mmol/L (ref 0–100)

## 2014-03-12 LAB — URINE MICROSCOPIC-ADD ON

## 2014-03-12 LAB — URINALYSIS, ROUTINE W REFLEX MICROSCOPIC
Bilirubin Urine: NEGATIVE
Glucose, UA: NEGATIVE mg/dL
Ketones, ur: NEGATIVE mg/dL
Leukocytes, UA: NEGATIVE
Nitrite: NEGATIVE
PH: 6.5 (ref 5.0–8.0)
PROTEIN: 100 mg/dL — AB
Specific Gravity, Urine: 1.011 (ref 1.005–1.030)
UROBILINOGEN UA: 0.2 mg/dL (ref 0.0–1.0)

## 2014-03-12 LAB — TROPONIN I
TROPONIN I: 0.08 ng/mL — AB (ref <0.031)
TROPONIN I: 0.58 ng/mL — AB (ref <0.031)
Troponin I: 0.44 ng/mL — ABNORMAL HIGH (ref <0.031)

## 2014-03-12 LAB — PROTIME-INR
INR: 1.17 (ref 0.00–1.49)
INR: 1.25 (ref 0.00–1.49)
PROTHROMBIN TIME: 15.1 s (ref 11.6–15.2)
Prothrombin Time: 15.8 seconds — ABNORMAL HIGH (ref 11.6–15.2)

## 2014-03-12 LAB — MAGNESIUM: Magnesium: 2 mg/dL (ref 1.5–2.5)

## 2014-03-12 LAB — APTT
APTT: 34 s (ref 24–37)
aPTT: 41 seconds — ABNORMAL HIGH (ref 24–37)

## 2014-03-12 LAB — LACTIC ACID, PLASMA: Lactic Acid, Venous: 2.6 mmol/L — ABNORMAL HIGH (ref 0.5–2.2)

## 2014-03-12 LAB — MRSA PCR SCREENING: MRSA by PCR: NEGATIVE

## 2014-03-12 SURGERY — LEFT HEART CATHETERIZATION WITH CORONARY ANGIOGRAM

## 2014-03-12 MED ORDER — MAGNESIUM SULFATE 2 GM/50ML IV SOLN: 2.0000 g | Freq: Once | INTRAVENOUS | Status: DC

## 2014-03-12 MED ORDER — CISATRACURIUM BOLUS VIA INFUSION
0.0500 mg/kg | INTRAVENOUS | Status: DC | PRN
  Filled 2014-03-12: qty 5

## 2014-03-12 MED ORDER — METHYLPREDNISOLONE SODIUM SUCC 125 MG IJ SOLR
80.0000 mg | Freq: Two times a day (BID) | INTRAMUSCULAR | Status: DC
  Administered 2014-03-12 – 2014-03-13 (×4): 80 mg via INTRAVENOUS
  Filled 2014-03-12: qty 2
  Filled 2014-03-12: qty 1.28
  Filled 2014-03-12: qty 2
  Filled 2014-03-12 (×3): qty 1.28

## 2014-03-12 MED ORDER — INSULIN ASPART 100 UNIT/ML ~~LOC~~ SOLN
0.0000 [IU] | SUBCUTANEOUS | Status: DC
  Administered 2014-03-12: 3 [IU] via SUBCUTANEOUS
  Administered 2014-03-12: 8 [IU] via SUBCUTANEOUS
  Administered 2014-03-12 (×2): 3 [IU] via SUBCUTANEOUS
  Administered 2014-03-13 (×2): 5 [IU] via SUBCUTANEOUS
  Administered 2014-03-13 – 2014-03-14 (×4): 2 [IU] via SUBCUTANEOUS

## 2014-03-12 MED ORDER — CHLORHEXIDINE GLUCONATE 0.12 % MT SOLN
15.0000 mL | Freq: Two times a day (BID) | OROMUCOSAL | Status: DC
  Administered 2014-03-12 – 2014-03-14 (×5): 15 mL via OROMUCOSAL
  Filled 2014-03-12 (×6): qty 15

## 2014-03-12 MED ORDER — SODIUM CHLORIDE 0.9 % IV SOLN
INTRAVENOUS | Status: DC
  Administered 2014-03-12: 06:00:00 via INTRAVENOUS

## 2014-03-12 MED ORDER — NOREPINEPHRINE BITARTRATE 1 MG/ML IV SOLN
0.0000 ug/min | INTRAVENOUS | Status: DC
  Administered 2014-03-12: 30 ug/min via INTRAVENOUS
  Administered 2014-03-12: 50 ug/min via INTRAVENOUS
  Administered 2014-03-13: 30 ug/min via INTRAVENOUS
  Administered 2014-03-13 (×2): 40 ug/min via INTRAVENOUS
  Filled 2014-03-12 (×5): qty 16

## 2014-03-12 MED ORDER — AMIODARONE HCL IN DEXTROSE 360-4.14 MG/200ML-% IV SOLN
60.0000 mg/h | INTRAVENOUS | Status: DC
  Administered 2014-03-12: 200 mL via INTRAVENOUS
  Filled 2014-03-12: qty 200

## 2014-03-12 MED ORDER — MIDAZOLAM BOLUS VIA INFUSION
1.0000 mg | INTRAVENOUS | Status: DC | PRN
  Administered 2014-03-13 (×2): 2 mg via INTRAVENOUS
  Filled 2014-03-12 (×3): qty 2

## 2014-03-12 MED ORDER — NITROGLYCERIN IN D5W 200-5 MCG/ML-% IV SOLN
INTRAVENOUS | Status: AC
  Filled 2014-03-12: qty 250

## 2014-03-12 MED ORDER — SODIUM CHLORIDE 0.9 % IV SOLN
0.0000 mg/h | INTRAVENOUS | Status: AC
  Administered 2014-03-12 – 2014-03-13 (×3): 2 mg/h via INTRAVENOUS
  Filled 2014-03-12 (×3): qty 10

## 2014-03-12 MED ORDER — FAMOTIDINE IN NACL 20-0.9 MG/50ML-% IV SOLN
20.0000 mg | Freq: Two times a day (BID) | INTRAVENOUS | Status: DC
  Administered 2014-03-12 – 2014-03-13 (×4): 20 mg via INTRAVENOUS
  Filled 2014-03-12 (×7): qty 50

## 2014-03-12 MED ORDER — ASPIRIN 300 MG RE SUPP
300.0000 mg | RECTAL | Status: AC
  Administered 2014-03-12: 300 mg via RECTAL
  Filled 2014-03-12: qty 1

## 2014-03-12 MED ORDER — CETYLPYRIDINIUM CHLORIDE 0.05 % MT LIQD
7.0000 mL | Freq: Four times a day (QID) | OROMUCOSAL | Status: DC
  Administered 2014-03-12 – 2014-03-14 (×9): 7 mL via OROMUCOSAL

## 2014-03-12 MED ORDER — AMIODARONE IV BOLUS ONLY 150 MG/100ML
150.0000 mg | Freq: Once | INTRAVENOUS | Status: AC
  Administered 2014-03-12: 150 mg via INTRAVENOUS

## 2014-03-12 MED ORDER — HEPARIN SODIUM (PORCINE) 5000 UNIT/ML IJ SOLN
5000.0000 [IU] | Freq: Three times a day (TID) | INTRAMUSCULAR | Status: DC
  Administered 2014-03-12 – 2014-03-17 (×15): 5000 [IU] via SUBCUTANEOUS
  Filled 2014-03-12 (×19): qty 1

## 2014-03-12 MED ORDER — SODIUM CHLORIDE 0.9 % IV SOLN: 2000.0000 mL | Freq: Once | INTRAVENOUS | Status: DC

## 2014-03-12 MED ORDER — LIDOCAINE IN D5W 4-5 MG/ML-% IV SOLN
0.5000 mg/min | INTRAVENOUS | Status: DC
  Administered 2014-03-12 – 2014-03-13 (×4): 1 mg/min via INTRAVENOUS
  Filled 2014-03-12 (×2): qty 500

## 2014-03-12 MED ORDER — ARTIFICIAL TEARS OP OINT
1.0000 "application " | TOPICAL_OINTMENT | Freq: Three times a day (TID) | OPHTHALMIC | Status: DC
  Administered 2014-03-12 – 2014-03-14 (×8): 1 via OPHTHALMIC
  Filled 2014-03-12 (×2): qty 3.5

## 2014-03-12 MED ORDER — SODIUM CHLORIDE 0.9 % IV SOLN
INTRAVENOUS | Status: AC
  Administered 2014-03-12: 75 mL/h via INTRAVENOUS

## 2014-03-12 MED ORDER — AMIODARONE LOAD VIA INFUSION
150.0000 mg | Freq: Once | INTRAVENOUS | Status: AC
  Administered 2014-03-12: 150 mg via INTRAVENOUS
  Filled 2014-03-12: qty 83.34

## 2014-03-12 MED ORDER — PANTOPRAZOLE SODIUM 40 MG PO PACK
40.0000 mg | PACK | Freq: Every day | ORAL | Status: DC
  Administered 2014-03-12: 40 mg
  Filled 2014-03-12: qty 20

## 2014-03-12 MED ORDER — NITROGLYCERIN 1 MG/10 ML FOR IR/CATH LAB
INTRA_ARTERIAL | Status: AC
  Filled 2014-03-12: qty 10

## 2014-03-12 MED ORDER — NITROGLYCERIN IN D5W 200-5 MCG/ML-% IV SOLN
0.0000 ug/min | Freq: Once | INTRAVENOUS | Status: AC
Start: 2014-03-12 — End: 2014-03-12
  Administered 2014-03-12: 5 ug/min via INTRAVENOUS

## 2014-03-12 MED ORDER — SODIUM CHLORIDE 0.9 % IV SOLN
1.0000 ug/kg/min | INTRAVENOUS | Status: DC
  Administered 2014-03-12 – 2014-03-13 (×2): 1 ug/kg/min via INTRAVENOUS
  Filled 2014-03-12 (×2): qty 20

## 2014-03-12 MED ORDER — FENTANYL BOLUS VIA INFUSION
50.0000 ug | INTRAVENOUS | Status: DC | PRN
  Filled 2014-03-12: qty 50

## 2014-03-12 MED ORDER — POTASSIUM CHLORIDE 10 MEQ/50ML IV SOLN
10.0000 meq | INTRAVENOUS | Status: AC
  Administered 2014-03-12 (×4): 10 meq via INTRAVENOUS
  Filled 2014-03-12 (×4): qty 50

## 2014-03-12 MED ORDER — INSULIN ASPART 100 UNIT/ML ~~LOC~~ SOLN
2.0000 [IU] | SUBCUTANEOUS | Status: DC
  Administered 2014-03-12: 6 [IU] via SUBCUTANEOUS
  Administered 2014-03-12: 4 [IU] via SUBCUTANEOUS

## 2014-03-12 MED ORDER — AMIODARONE HCL IN DEXTROSE 360-4.14 MG/200ML-% IV SOLN
60.0000 mg/h | INTRAVENOUS | Status: AC
  Administered 2014-03-12 – 2014-03-13 (×5): 60 mg/h via INTRAVENOUS
  Filled 2014-03-12 (×7): qty 200

## 2014-03-12 MED ORDER — AMIODARONE HCL IN DEXTROSE 360-4.14 MG/200ML-% IV SOLN
60.0000 mg/h | INTRAVENOUS | Status: DC
  Filled 2014-03-12 (×2): qty 200

## 2014-03-12 MED ORDER — LIDOCAINE HCL (PF) 1 % IJ SOLN
INTRAMUSCULAR | Status: AC
  Filled 2014-03-12: qty 30

## 2014-03-12 MED ORDER — CISATRACURIUM BOLUS VIA INFUSION
0.1000 mg/kg | Freq: Once | INTRAVENOUS | Status: AC
  Administered 2014-03-12: 10 mg via INTRAVENOUS
  Filled 2014-03-12: qty 10

## 2014-03-12 MED ORDER — FENTANYL CITRATE 0.05 MG/ML IJ SOLN: 50.0000 ug | Freq: Once | INTRAMUSCULAR | Status: DC

## 2014-03-12 MED ORDER — SODIUM CHLORIDE 0.9 % IV SOLN
2000.0000 mL | Freq: Once | INTRAVENOUS | Status: AC
  Administered 2014-03-11: 2000 mL via INTRAVENOUS

## 2014-03-12 MED ORDER — HEPARIN (PORCINE) IN NACL 2-0.9 UNIT/ML-% IJ SOLN
INTRAMUSCULAR | Status: AC
  Filled 2014-03-12: qty 1000

## 2014-03-12 MED ORDER — SODIUM CHLORIDE 0.9 % IV SOLN
25.0000 ug/h | INTRAVENOUS | Status: DC
  Administered 2014-03-12: 50 ug/h via INTRAVENOUS
  Administered 2014-03-13: 100 ug/h via INTRAVENOUS
  Administered 2014-03-14: 150 ug/h via INTRAVENOUS
  Filled 2014-03-12 (×2): qty 50

## 2014-03-12 MED ORDER — SODIUM CHLORIDE 0.9 % IV SOLN: INTRAVENOUS | Status: DC

## 2014-03-12 MED ORDER — NOREPINEPHRINE BITARTRATE 1 MG/ML IV SOLN
0.0000 ug/min | INTRAVENOUS | Status: DC
  Administered 2014-03-12: 15 ug/min via INTRAVENOUS
  Administered 2014-03-12: 5 ug/min via INTRAVENOUS
  Filled 2014-03-12 (×4): qty 4

## 2014-03-12 MED ORDER — LIDOCAINE HCL (CARDIAC) 20 MG/ML IV SOLN
100.0000 mg | Freq: Once | INTRAVENOUS | Status: AC
  Administered 2014-03-12: 100 mg via INTRAVENOUS

## 2014-03-12 MED FILL — Medication: Qty: 1 | Status: AC

## 2014-03-12 NOTE — Progress Notes (Signed)
   03/12/14 0022  Clinical Encounter Type  Visited With Family;Health care provider  Visit Type ED;Spiritual support  Chaplain responded to page from ED for code stemi. When family arrived, chaplain escorted them to consult room and facilitated communication between family and medical staff. Chaplain accompanied pt's spouse and sister to trauma room. Chaplain provided hospitality. Family is supporting each other and do not require further chaplain support at this time. Page if needed.  Wille Glaser 03/12/2014 12:24 AM

## 2014-03-12 NOTE — Progress Notes (Signed)
  Echocardiogram 2D Echocardiogram has been performed.  Christopher Patrick M 03/12/2014, 2:42 PM

## 2014-03-12 NOTE — ED Notes (Signed)
Amiodarone 150 mg IV push

## 2014-03-12 NOTE — ED Notes (Signed)
Hypothermia pads being placed on pt.

## 2014-03-12 NOTE — Progress Notes (Signed)
Notified [facility] of pts K 3.5,  MD did not want to replace at this time.

## 2014-03-12 NOTE — Progress Notes (Signed)
 The patient is a 48 y/o male with no prior cardiac history. His PMH is significant for sarcoid with known pulmonary involement and GERD. He presented to [facility] 03/11/14 after sustaining a witnessed out of hospital cardiac arrest at home. Upon EMS arrival, he became pulseless. Underlying rhythm was VT. CPR was initiated for a duration of 20 minutes. He was defibrillated x 3 and received 2 rounds of epi, a bolus of 150 mg of Amiodarone and started on a lidocaine drip. He was intubated in the ED and placed on ARTIC SUN Protocol. He was transferred to the CCU. Since admission, he has had recurrent VT in the unit requiring additional shocks x 2. He has received potassium supplementation. K this am was 3.2. Repeat K after supplementation was 3.8. Mg is stable at 2.0. He continues on IV Amiodarone and lidocaine. He is also on supplemental Levothroid  Subjective:  Remains intubated and on hypothermia protocol. Sedated. Apparently stabilized from VT at roughly 2 in the morning. However this morning at 8:30 for the patient went into prolonged sustained monomorphic ventricular tachycardia. He was shocked with restoration of what appears to be a junctional escape rhythm with some sinus bradycardia and AV dissociation. He subsequently went back into ventricular tachycardia requiring a second shock at 120 J. Again we restored underlying narrow complex rhythm.   Throughout the course of the morning the patient has had at least 2 more episodes of ventricular tachycardia requiring additional shocks one was at 1010, the other 1150. ELECTROPHYSIOLOGY has been counseled.  Objective:  Vital Signs in the last 24 hours: Temp:  [90 F (32.2 C)-97.9 F (36.6 C)] 90 F (32.2 C) (01/06 1200) Pulse Rate:  [54-120] 65 (01/06 1216) Resp:  [13-28] 16 (01/06 1216) BP: (76-202)/(50-186) 94/72 mmHg (01/06 1200) SpO2:  [90 %-100 %] 95 % (01/06 1216) Arterial Line BP: (80-132)/(61-85) 86/66 mmHg (01/06 1216) FiO2 (%):  [60 %-100 %]  60 % (01/06 0846) Weight:  [203 lb 11.3 oz (92.4 kg)-220 lb (99.791 kg)] 203 lb 11.3 oz (92.4 kg) (01/06 0300)  Intake/Output from previous day: 01/05 0701 - 01/06 0700 In: 726.5 [I.V.:626.5; IV Piggyback:100] Out: 1960 [Urine:1960] Intake/Output from this shift: Total I/O In: 649.3 [I.V.:549.3; IV Piggyback:100] Out: 135 [Urine:135]  Physical Exam: General: Intubated, not responding Psych: Sedated Neuro: Sedated. No spontaneous movement. PERRL but constricted HEENT: ETT. Some blood around ETT. Neck: Supple without bruits or JVD. Lungs: Coarse BS anteriorly Heart: RRR no s3, s4, or murmurs. Abdomen: Soft, non-tender, non-distended, BS + x 4.  Extremities: No clubbing, cyanosis or edema. DP/PT/Radials 2+ and equal bilaterally. Cool  Lab Results:  Recent Labs  03/11/14 2337 03/12/14 03/12/14 0701  WBC 13.0*  --  14.3*  HGB 14.4 15.0 17.5*  PLT 189  --  209    Recent Labs  03/12/14 0530 03/12/14 0658  NA 138 136  K 3.2* 3.8  CL 107 105  CO2 19 20  GLUCOSE 209* 187*  BUN 14 14  CREATININE 1.04 1.11    Recent Labs  03/12/14 0401 03/12/14 1001  TROPONINI 0.44* 0.58*   Hepatic Function Panel No results for input(s): PROT, ALBUMIN, AST, ALT, ALKPHOS, BILITOT, BILIDIR, IBILI in the last 72 hours. No results for input(s): CHOL in the last 72 hours. No results for input(s): PROTIME in the last 72 hours.  Imaging: Imaging results have been reviewed  Cardiac Studies: Telemetry shows several episodes/salvos of monomorphic VT but with different morphologies with different salvos. Baseline rhythm appears to be   either sinus rhythm versus sinus bradycardia with a junctional escape rhythm and A-V dissociation. Baseline QRS complex as and a right bundle-branch block.  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 He is currently on both amiodarone and lidocaine. I discussed with Dr. Taylor from EP. The plan is to continue amiodarone loaded with additional boluses.  With multifocal monomorphic VT, this is probably less likely to be ischemic, however as part of evaluation diagnostic catheterization is warranted. I would like to see him more stable from a rhythm standpoint before taking him to the Cath Lab however. Tentatively scheduled for today, but would likely postpone if he continues to have salvos of VT. -- Low Level Troponin levels --> unlikely ischemia/MI related - quite likely primary arrhythmogenic.  Would not anticoagulate  Echocardiogram has been ordered, but again would like to make sure that he is maintaining sinus/narrow complex rhythm while having the echo done. He had VT requiring defibrillation when the echo tech was starting her echo and therefore was aborted.  After discussing with Dr. Taylor, the concern is that if there truly is a diagnosis of pulmonary sarcoid, this may very well be consistent with cardiac sarcoid he would eventually require ICD therapy, however we will need to await return of neurologic function.  Socially he continues to have some salvos of slow ventricular tachycardia with rates in the 110-120 range and maintaining a stable blood pressure. At this point I think defibrillation of pulseless less hypotensive episodes is warranted, but were otherwise to simply monitor as he does seem to come out of them spontaneously.  Would continue at higher dose amiodarone infusion of 60 mg/hour and have given additional 3 boluses of 150 mg. We'll defer the decision on lidocaine to Dr. Taylor. Lidocaine typically is better for ischemic VT, therefore I question how much this is helping.  We'll hold off on cardiac catheterization until he remained stable from a rhythm standpoint. Maintain  magnesium greater than 2 and potassium greater than 4.  Continue the Levophed for blood pressure support   CODE CARE: I spent a total of 90 minutes at bedside with the patient with chart reviewed, examination and running 3 separate "codes with cardioversion/defibrillation".  Adjusting vasopressors and antiarrhythmics. Multiple consultations with electrophysiology and the PCCM.  The patient remains critically ill with complex decision making. Critical care time indicated above.   LOS: 1 day    HARDING, DAVID W 03/12/2014, 12:39 PM    

## 2014-03-12 NOTE — Progress Notes (Signed)
PULMONARY / CRITICAL CARE MEDICINE   Name: Christopher Patrick MRN: [id number] DOB: [birth year]    ADMISSION DATE:  03/11/2014 CONSULTATION DATE:  03/11/14   REFERRING MD :  ED Dr. Preston Fleeting  CHIEF COMPLAINT:  Cardiac arrest   INITIAL PRESENTATION: 48 PMH sarcoid, GERD presenting s/p cardiac arrest found at home by wife initially alert then progressed to AMS. Prior to this he had hand paresthesias and palpitations a day ago.  EMS arrived VT, VF arrest w/o pulse CPR initiated ~10:30 PM with shocked x 3, given Epi x 2 transported to [facility]. CPR x 15-20 min and amio150 bolus and started on Lidocaine drip. He was intubated in the ED and cooled in the unit.    STUDIES:  1/6 CT head>>Mild loss of the expected gray-white matter differentiation, this could be artifact or reflect very early global brain edema/ anoxia. Recommend followup.  1/6 CXR Endotracheal tube appears to be in good position. Enteric tube is somewhat shallow with proximal side hole probably in the distal esophageal region. Cardiac enlargement. Infiltration or atelectasis in the left mid lung.  1/6 CXR Left central venous catheter with tip over the cavoatrial junction. No pneumothorax. Enteric tube tip remains at the level of the EG junction. Cardiac enlargement. Bilateral perihilar infiltrates  1/6 Ab portable Enteric tube tip projects over the EG junction with proximal side hole probably in the lower esophagus.  1/6 echo  1/7 CXR   SIGNIFICANT EVENTS: 1/6>>VF arrest  1/6>>VT 8:33 shocked x 1, VT 8:35 shocked again and again at 10:13 w/ Amio bolus x 2, VT shock again at 11:43   HISTORY OF PRESENT ILLNESS:   48 PMH sarcoid, GERD presenting s/p cardiac arrest found at home by wife initially alert then progressed to AMS. Prior to this he had hand paresthesias and palpitations a day ago.  EMS arrived VT, VF arrest w/o pulse CPR initiated ~10:30 PM with shocked x 3, given Epi x 2 transported to [facility]. CPR x 15-20 min and  amio150 bolus and started on Lidocaine drip. He was intubated in the ED and cooled in the unit.  Had episodes of VT 1/6 am.    PAST MEDICAL HISTORY :   has a past medical history of Sarcoid and GERD (gastroesophageal reflux disease).  has no past surgical history on file.  Medications Prior to Admission  Medication Sig Dispense Refill  . pantoprazole (PROTONIX) 20 MG tablet Take 20 mg by mouth 2 (two) times daily.     No Known Allergies  FAMILY HISTORY:  has no family status information on file.  SOCIAL HISTORY:  reports that he does not drink alcohol.  REVIEW OF SYSTEMS:   Unable to obtain due to mental status   SUBJECTIVE:  Unable to obtain due to mental status   VITAL SIGNS: Temp:  [90.9 F (32.7 C)-97.9 F (36.6 C)] 91.8 F (33.2 C) (01/06 0800) Pulse Rate:  [54-110] 70 (01/06 0846) Resp:  [13-28] 20 (01/06 0800) BP: (76-202)/(50-186) 98/71 mmHg (01/06 0846) SpO2:  [90 %-100 %] 100 % (01/06 0800) Arterial Line BP: (92-132)/(63-81) 115/81 mmHg (01/06 0800) FiO2 (%):  [60 %-100 %] 60 % (01/06 0846) Weight:  [203 lb 11.3 oz (92.4 kg)-220 lb (99.791 kg)] 203 lb 11.3 oz (92.4 kg) (01/06 0300) HEMODYNAMICS: CVP:  [4 mmHg-5 mmHg] 5 mmHg VENTILATOR SETTINGS: Vent Mode:  [-] PRVC FiO2 (%):  [60 %-100 %] 60 % Set Rate:  [16 bmp-20 bmp] 20 bmp Vt Set:  [409 mL] 620 mL  PEEP:  [5 cmH20] 5 cmH20 Plateau Pressure:  [21 cmH20] 21 cmH20 INTAKE / OUTPUT:  Intake/Output Summary (Last 24 hours) at 03/12/14 0904 Last data filed at 03/12/14 0800  Gross per 24 hour  Intake 827.46 ml  Output   2020 ml  Net -1192.54 ml    PHYSICAL EXAMINATION: General:  Resting in bed Neuro:  Sedated  HEENT:  Eyes closed, ETT intact Cardiovascular:  Junctional rhythm intermittent Vt Lungs:  ctab Abdomen:  Soft, nl bs, ntnd Musculoskeletal:  Nl muscle tone Skin:  Intact   LABS:  CBC  Recent Labs Lab 03/11/14 2337 03/12/14  WBC 13.0*  --   HGB 14.4 15.0  HCT 43.2 44.0  PLT 189  --     Coag's  Recent Labs Lab 03/12/14 0300  APTT 34  INR 1.17   BMET  Recent Labs Lab 03/12/14 0300 03/12/14 0530 03/12/14 0658  NA 139 138 136  K 3.2* 3.2* 3.8  CL 106 107 105  CO2 BUN CREATININE 1.04 1.04 1.11  GLUCOSE 204* 209* 187*   Electrolytes  Recent Labs Lab 03/11/14 2337 03/12/14 0300 03/12/14 0530 03/12/14 0658  CALCIUM 8.8 8.3* 8.4 8.1*  MG 2.0  --   --   --    Sepsis Markers  Recent Labs Lab 03/11/14 2354 03/12/14 0415  LATICACIDVEN 4.33* 2.6*   ABG  Recent Labs Lab 03/12/14 0040 03/12/14 0339 03/12/14 0540  PHART 7.308* 7.343* 7.449  PCO2ART 47.9* 35.3 31.5*  PO2ART 82.0 63.0* 222.0*   Liver Enzymes No results for input(s): AST, ALT, ALKPHOS, BILITOT, ALBUMIN in the last 168 hours. Cardiac Enzymes  Recent Labs Lab 03/11/14 2337 03/12/14 0401  TROPONINI 0.08* 0.44*   Glucose  Recent Labs Lab 03/12/14 0209 03/12/14 0319 03/12/14 0414 03/12/14 0534  GLUCAP 174* 192* 210* 180*    Imaging No results found.   ASSESSMENT / PLAN:  PULMONARY OETT 1/6>> A: Acute respiratory failure  VDRF 1/6 CXR with bilateral perihilar infiltrates  ?pulm sarcoid hx   P:   Continue full vent support ABG, CXR in am Will start steroid 80 q12   CARDIOVASCULAR CVL Left IJ 1/6, L rad A line PIV x 2,  A:  S/p cardiac arrest Recurrent multifocal/monophasic VT Elevated troponin possibly demand r/o ACS 1st degree heart block, RBBB Prolonged QTc 147>829>562 ? Infiltrative cardiomyopathy  P:  Hypothermia protocol.  Cardiology following will consult EP (Dr. Ladona Ridgel) and may take to cath lab later today Lido gtt, Amio gtt and Amio 150 bolus this am x 2  On Levo Aspirin 300 pr Given Mag 2 g in ED, monitor electrolytes  Trend troponin pending   RENAL A:   Non AG Metabolic acidosis Metabolic Alkalosis  Respiratory Acidosis   P:   Strict i/o daily weights   GASTROINTESTINAL A:   GI px  NPO with NGT  P:    Protonix  Re eval for feeds in am  HEMATOLOGIC A:   DVT px   P:  Scds, adde heparin sq   INFECTIOUS A:   Leukocytosis   P:   BCx2  Not obtained UC not obtained Sputum not obtained MRSA neg  Abx: none  ENDOCRINE A:   Hyperglycemia   P:   HA1C pending SSI  NEUROLOGIC A:   Sedated  Encephalopathy   P:   RASS goal: currently at -5  Fentanyl, Versed, Nimbex    FAMILY  - Updates: wife at bedside by cardiology today -  Inter-disciplinary family meet or Palliative Care meeting due by:  03/19/14     TODAY'S SUMMARY: VT this am and cardiac arrest. Cardiology following. EP will see later today and patient will may go to cath lab later today.  Continue full vent support   The patient is critically ill with multiple organ systems failure and requires high complexity decision making for assessment and support, frequent evaluation and titration of therapies, application of advanced monitoring technologies and extensive interpretation of multiple databases.    Desma Maxim, PGY 3   See my note earlier in day Billy Fischer, MD ; [facility] [phone].  After 5:30 PM or weekends, call [phone]  03/12/2014, 9:04 AM

## 2014-03-12 NOTE — Progress Notes (Signed)
Pt in sustained VT. MD aware and advised to shock. Amio bolus ordered and given. Will cont to update and monitor closely.

## 2014-03-12 NOTE — ED Notes (Signed)
Cardiac monitor VT pt continues to have palpable pulses.

## 2014-03-12 NOTE — Procedures (Signed)
Arterial Catheter Insertion Procedure Note Christopher Patrick [id number] 1965-07-30  Procedure: Insertion of Arterial Catheter  Indications: Blood pressure monitoring  Procedure Details Consent: Risks of procedure as well as the alternatives and risks of each were explained to the (patient/caregiver).  Consent for procedure obtained. Time Out: Verified patient identification, verified procedure, site/side was marked, verified correct patient position, special equipment/implants available, medications/allergies/relevent history reviewed, required imaging and test results available.  Performed  Maximum sterile technique was used including antiseptics, cap, gloves and gown. Skin prep: Chlorhexidine; local anesthetic administered 20 gauge catheter was inserted into left radial artery using the Seldinger technique.  Evaluation Blood flow good; BP tracing good. Complications: No apparent complications.   Signed:  Dow Adolph, MD PGY-3 Internal Medicine Teaching Service Pager: [phone] 03/12/2014, 3:13 AM    Billy Fischer, MD ; [facility] service Mobile [phone].  After 5:30 PM or weekends, call [phone]

## 2014-03-12 NOTE — Procedures (Signed)
Central Venous Catheter Insertion Procedure Note Christopher Patrick [id number] [birth year]  Procedure: Insertion of Central Venous Catheter Indications: Drug and/or fluid administration  Procedure Details Consent: Risks of procedure as well as the alternatives and risks of each were explained to the (patient/caregiver).  Consent for procedure obtained. Time Out: Verified patient identification, verified procedure, site/side was marked, verified correct patient position, special equipment/implants available, medications/allergies/relevent history reviewed, required imaging and test results available.  Performed  Maximum sterile technique was used including antiseptics, cap, gloves, gown, hand hygiene, mask and sheet. Skin prep: Chlorhexidine; local anesthetic administered A antimicrobial bonded/coated triple lumen catheter was placed in the left internal jugular vein using the Seldinger technique.  Evaluation Blood flow good Complications: No apparent complications Patient did tolerate procedure well. Chest X-ray ordered to verify placement.  CXR: pending.  Christopher Patrick 03/12/2014, 3:09 AM   Christopher Fischer, MD ; [facility] service Mobile [phone].  After 5:30 PM or weekends, call [phone]

## 2014-03-12 NOTE — Interval H&P Note (Signed)
History and Physical Interval Note:  03/12/2014 3:06 PM  Christopher Patrick  has presented today for surgery, with the diagnosis of vt  The various methods of treatment have been discussed with the patient and family. After consideration of risks, benefits and other options for treatment, the patient has consented to  Procedure(s): LEFT HEART CATHETERIZATION WITH CORONARY ANGIOGRAM (N/A) as a surgical intervention .  The patient's history has been reviewed, patient examined, no change in status, stable for surgery.  I have reviewed the patient's chart and labs.  Questions were answered to the patient's satisfaction.     Lorine Bears

## 2014-03-12 NOTE — Progress Notes (Signed)
Pt very critical. Have been attempting to maintain map >80 per protocol. Very difficult in patient's critical situation. MD aware. Titrating Levo as necessary and able. Per MD order if pt in VT shock if pt drops BP and is staying in VT >3 mins.

## 2014-03-12 NOTE — Progress Notes (Signed)
Attempted again to get EEG completed/ pt still unavailable per Nurse. Pt will be going to cath lab.

## 2014-03-12 NOTE — H&P (View-Only) (Signed)
The patient is a 49 y/o male with no prior cardiac history. His PMH is significant for sarcoid with known pulmonary involement and GERD. He presented to [facility] 03/11/14 after sustaining a witnessed out of hospital cardiac arrest at home. Upon EMS arrival, he became pulseless. Underlying rhythm was VT. CPR was initiated for a duration of 20 minutes. He was defibrillated x 3 and received 2 rounds of epi, a bolus of 150 mg of Amiodarone and started on a lidocaine drip. He was intubated in the ED and placed on ARTIC SUN Protocol. He was transferred to the CCU. Since admission, he has had recurrent VT in the unit requiring additional shocks x 2. He has received potassium supplementation. K this am was 3.2. Repeat K after supplementation was 3.8. Mg is stable at 2.0. He continues on IV Amiodarone and lidocaine. He is also on supplemental Levothroid  Subjective:  Remains intubated and on hypothermia protocol. Sedated. Apparently stabilized from VT at roughly 2 in the morning. However this morning at 8:30 for the patient went into prolonged sustained monomorphic ventricular tachycardia. He was shocked with restoration of what appears to be a junctional escape rhythm with some sinus bradycardia and AV dissociation. He subsequently went back into ventricular tachycardia requiring a second shock at 120 J. Again we restored underlying narrow complex rhythm.   Throughout the course of the morning the patient has had at least 2 more episodes of ventricular tachycardia requiring additional shocks one was at 1010, the other 1150. ELECTROPHYSIOLOGY has been counseled.  Objective:  Vital Signs in the last 24 hours: Temp:  [90 F (32.2 C)-97.9 F (36.6 C)] 90 F (32.2 C) (01/06 1200) Pulse Rate:  [54-120] 65 (01/06 1216) Resp:  [13-28] 16 (01/06 1216) BP: (76-202)/(50-186) 94/72 mmHg (01/06 1200) SpO2:  [90 %-100 %] 95 % (01/06 1216) Arterial Line BP: (80-132)/(61-85) 86/66 mmHg (01/06 1216) FiO2 (%):  [60 %-100 %]  60 % (01/06 0846) Weight:  [203 lb 11.3 oz (92.4 kg)-220 lb (99.791 kg)] 203 lb 11.3 oz (92.4 kg) (01/06 0300)  Intake/Output from previous day: 01/05 0701 - 01/06 0700 In: 726.5 [I.V.:626.5; IV Piggyback:100] Out: 1960 [Urine:1960] Intake/Output from this shift: Total I/O In: 649.3 [I.V.:549.3; IV Piggyback:100] Out: 135 [Urine:135]  Physical Exam: General: Intubated, not responding Psych: Sedated Neuro: Sedated. No spontaneous movement. PERRL but constricted HEENT: ETT. Some blood around ETT. Neck: Supple without bruits or JVD. Lungs: Coarse BS anteriorly Heart: RRR no s3, s4, or murmurs. Abdomen: Soft, non-tender, non-distended, BS + x 4.  Extremities: No clubbing, cyanosis or edema. DP/PT/Radials 2+ and equal bilaterally. Cool  Lab Results:  Recent Labs  03/11/14 2337 03/12/14 03/12/14 0701  WBC 13.0*  --  14.3*  HGB 14.4 15.0 17.5*  PLT 189  --  209    Recent Labs  03/12/14 0530 03/12/14 0658  NA 138 136  K 3.2* 3.8  CL 107 105  CO2 19 20  GLUCOSE 209* 187*  BUN 14 14  CREATININE 1.04 1.11    Recent Labs  03/12/14 0401 03/12/14 1001  TROPONINI 0.44* 0.58*   Hepatic Function Panel No results for input(s): PROT, ALBUMIN, AST, ALT, ALKPHOS, BILITOT, BILIDIR, IBILI in the last 72 hours. No results for input(s): CHOL in the last 72 hours. No results for input(s): PROTIME in the last 72 hours.  Imaging: Imaging results have been reviewed  Cardiac Studies: Telemetry shows several episodes/salvos of monomorphic VT but with different morphologies with different salvos. Baseline rhythm appears to be  either sinus rhythm versus sinus bradycardia with a junctional escape rhythm and A-V dissociation. Baseline QRS complex as and a right bundle-branch block.  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 He is currently on both amiodarone and lidocaine. I discussed with Dr. Ladona Ridgel from EP. The plan is to continue amiodarone loaded with additional boluses.  With multifocal monomorphic VT, this is probably less likely to be ischemic, however as part of evaluation diagnostic catheterization is warranted. I would like to see him more stable from a rhythm standpoint before taking him to the Cath Lab however. Tentatively scheduled for today, but would likely postpone if he continues to have salvos of VT. -- Low Level Troponin levels --> unlikely ischemia/MI related - quite likely primary arrhythmogenic.  Would not anticoagulate  Echocardiogram has been ordered, but again would like to make sure that he is maintaining sinus/narrow complex rhythm while having the echo done. He had VT requiring defibrillation when the echo tech was starting her echo and therefore was aborted.  After discussing with Dr. Ladona Ridgel, the concern is that if there truly is a diagnosis of pulmonary sarcoid, this may very well be consistent with cardiac sarcoid he would eventually require ICD therapy, however we will need to await return of neurologic function.  Socially he continues to have some salvos of slow ventricular tachycardia with rates in the 110-120 range and maintaining a stable blood pressure. At this point I think defibrillation of pulseless less hypotensive episodes is warranted, but were otherwise to simply monitor as he does seem to come out of them spontaneously.  Would continue at higher dose amiodarone infusion of 60 mg/hour and have given additional 3 boluses of 150 mg. We'll defer the decision on lidocaine to Dr. Ladona Ridgel. Lidocaine typically is better for ischemic VT, therefore I question how much this is helping.  We'll hold off on cardiac catheterization until he remained stable from a rhythm standpoint. Maintain  magnesium greater than 2 and potassium greater than 4.  Continue the Levophed for blood pressure support   CODE CARE: I spent a total of 90 minutes at bedside with the patient with chart reviewed, examination and running 3 separate "codes with cardioversion/defibrillation".  Adjusting vasopressors and antiarrhythmics. Multiple consultations with electrophysiology and the PCCM.  The patient remains critically ill with complex decision making. Critical care time indicated above.   LOS: 1 day    HARDING, DAVID W 03/12/2014, 12:39 PM

## 2014-03-12 NOTE — Progress Notes (Signed)
Attempted EEG at Bedside earlier this morning. Pt unavailable according to the nurse. Will try again this afternoon

## 2014-03-12 NOTE — CV Procedure (Signed)
    Cardiac Catheterization Procedure Note  Name: Christopher Patrick MRN: [id number] DOB: [birth year]  Procedure: Left Heart Cath, Selective Coronary Angiography.  Indication: ventricular tachycardia and cardiomyopathy   Medications:  Sedation:  0 mg IV Versed, 0 mcg IV Fentanyl  Contrast:  30 ml Omnipaque  Procedural details: The right groin was prepped, draped, and anesthetized with 1% lidocaine. Using modified Seldinger technique, a 5 French sheath was introduced into the right femoral artery. Standard Judkins catheters were used for coronary angiography . Catheter exchanges were performed over a guidewire. A Mynx closure device was used. There were no immediate procedural complications. The patient was transferred to the post catheterization recovery area for further monitoring.   Procedural Findings:  Hemodynamics: AO:  97/76   mmHg LV:  106/15    mmHg LVEDP: 18  mmHg  Coronary angiography: Coronary dominance: right    Left Main:  Normal  Left Anterior Descending (LAD):  Normal in size with no significant disease.  1st diagonal (D1):  Normal in size with no significant disease.  2nd diagonal (D2):  Medium in size with no significant disease.  3rd diagonal (D3):  Medium in size with no significant disease.  Circumflex (LCx):  Normal in size and nondominant. There is the vessel has no significant disease.  1st obtuse marginal:  Large in size with no significant disease.  2nd obtuse marginal:  Normal in size with no significant disease.  3rd obtuse marginal:  Medium in size with no significant disease.   Right Coronary Artery: Large in size and dominant. The vessel has no significant disease.  Posterior descending artery: Normal in size with no significant disease.  Posterior AV segment: Normal  Posterolateral branchs:  Normal  Left ventriculography: Was not performed. Ejection fraction was 15-20% by echo.   Final Conclusions:   1. Normal coronary  arteries. 2. Mildly elevated left ventricular end-diastolic pressure.  3. Severely reduced LV systolic function by echo.   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 Continue treatment for ventricular tachycardia. 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    Lorine Bears MD, [facility] 03/12/2014, 3:41 PM

## 2014-03-12 NOTE — Consult Note (Addendum)
Reason for Consult: Recurrent VT Referring Physician: Dr. Ellyn Hack   HPI: The patient is a 49 y/o male with no prior cardiac history. His PMH is significant for sarcoid with known pulmonary involement and GERD. He presented to [facility] 03/11/14 after sustaining a witnessed out of hospital cardiac arrest at home. Upon EMS arrival, he became pulseless. Underlying rhythm was VT. CPR was initiated for a duration of 20 minutes. He was defibrillated x 3 and received 2 rounds of epi, a bolus of 150 mg of Amiodarone and started on a lidocaine drip. He was intubated in the ED and placed on ARTIC SUN Protocol. He was transferred to the CCU. Since admission, he has had recurrent VT in the unit requiring additional shocks x 2.  He has received potassium supplementation. K this am was 3.2. Repeat K after supplementation was 3.8. Mg is stable at 2.0. He continues on IV Amiodarone and lidocaine.  Plan is for diagnostic LHC to rule out ischemic etiologies. Bedside 2D echo pending. Troponin is elevated x 2 at 0.08 and 0.44. EP has been consulted for additional recommendations. According to his wife, the patient had complained of palpitations on several instances over the past few days.    Past Medical History  Diagnosis Date  . Sarcoid   . GERD (gastroesophageal reflux disease)     History reviewed. No pertinent past surgical history.  No family history on file.  Social History:  reports that he does not drink alcohol. His tobacco and drug histories are not on file.  Allergies: No Known Allergies  Medications: . Current Facility-Administered Medications  Medication Dose Route Frequency Provider Last Rate Last Dose  . 0.9 %  sodium chloride infusion   Intravenous Continuous Wilhelmina Mcardle, MD   Stopped at 03/12/14 0600  . amiodarone (NEXTERONE PREMIX) 360 MG/200ML (1.8 mg/mL) IV infusion  30 mg/hr Intravenous Continuous Corey Harold, NP 16.7 mL/hr at 03/12/14 0905 30 mg/hr at 03/12/14 0905  . antiseptic oral  rinse (CPC / CETYLPYRIDINIUM CHLORIDE 0.05%) solution 7 mL  7 mL Mouth Rinse QID Wilhelmina Mcardle, MD   7 mL at 03/12/14 0400  . artificial tears (LACRILUBE) ophthalmic ointment 1 application  1 application Both Eyes 3 times per day Jessee Avers, MD   1 application at 86/38/17 [phone]  . chlorhexidine (PERIDEX) 0.12 % solution 15 mL  15 mL Mouth Rinse BID Wilhelmina Mcardle, MD   15 mL at 03/12/14 0817  . cisatracurium (NIMBEX) 200 mg in sodium chloride 0.9 % 200 mL (1 mg/mL) infusion  1-1.5 mcg/kg/min Intravenous Continuous Jessee Avers, MD 6 mL/hr at 03/12/14 0700 1 mcg/kg/min at 03/12/14 0700   And  . cisatracurium (NIMBEX) bolus via infusion 5 mg  0.05 mg/kg Intravenous PRN Jessee Avers, MD      . fentaNYL (SUBLIMAZE) 2,500 mcg in sodium chloride 0.9 % 250 mL (10 mcg/mL) infusion  25-400 mcg/hr Intravenous Continuous Corey Harold, NP 5 mL/hr at 03/12/14 0700 50 mcg/hr at 03/12/14 0700  . fentaNYL (SUBLIMAZE) bolus via infusion 50 mcg  50 mcg Intravenous Q1H PRN Corey Harold, NP      . fentaNYL (SUBLIMAZE) injection 50 mcg  50 mcg Intravenous Once Corey Harold, NP      . insulin aspart (novoLOG) injection 2-6 Units  2-6 Units Subcutaneous 6 times per day Jessee Avers, MD   4 Units at 03/12/14 0827  . lidocaine (cardiac) IV  infusion 4 mg/mL  1 mg/min Intravenous Titrated Lamar Sprinkles, MD [phone]  mL/hr at 03/12/14 0700 1 mg/min at 03/12/14 0700  . midazolam (VERSED) 50 mg in sodium chloride 0.9 % 50 mL (1 mg/mL) infusion  0-10 mg/hr Intravenous Continuous Corey Harold, NP 2 mL/hr at 03/12/14 0700 2 mg/hr at 03/12/14 0700  . midazolam (VERSED) bolus via infusion 1-2 mg  1-2 mg Intravenous Q2H PRN Corey Harold, NP      . nitroGLYCERIN 0.2 mg/mL in dextrose 5 % infusion        Stopped at 03/12/14 0330  . norepinephrine (LEVOPHED) 4 mg in dextrose 5 % 250 mL (0.016 mg/mL) infusion  0-50 mcg/min Intravenous Titrated Jessee Avers, MD 56.3 mL/hr at 03/12/14 0908 15 mcg/min at 03/12/14 0908    . pantoprazole sodium (PROTONIX) 40 mg/20 mL oral suspension 40 mg  40 mg Per Tube Daily Jessee Avers, MD      . potassium chloride 10 mEq in 50 mL *CENTRAL LINE* IVPB  10 mEq Intravenous Q1 Hr x 4 Kara Mead V, MD   10 mEq at 03/12/14 6283  ,e  Results for orders placed or performed during the hospital encounter of 03/11/14 (from the past 48 hour(s))  Basic metabolic panel     Status: Abnormal   Collection Time: 03/11/14 11:37 PM  Result Value Ref Range   Sodium 140 135 - 145 mmol/L    Comment: Please note change in reference range.   Potassium 4.1 3.5 - 5.1 mmol/L    Comment: Please note change in reference range.   Chloride 107 96 - 112 mEq/L   CO2 22 19 - 32 mmol/L   Glucose, Bld 173 (H) 70 - 99 mg/dL   BUN 15 6 - 23 mg/dL   Creatinine, Ser 1.50 (H) 0.50 - 1.35 mg/dL   Calcium 8.8 8.4 - 10.5 mg/dL   GFR calc non Af Amer 53 (L) >90 mL/min   GFR calc Af Amer 61 (L) >90 mL/min    Comment: (NOTE) The eGFR has been calculated using the CKD EPI equation. This calculation has not been validated in all clinical situations. eGFR's persistently <90 mL/min signify possible Chronic Kidney Disease.    Anion gap 11 5 - 15  Troponin I     Status: Abnormal   Collection Time: 03/11/14 11:37 PM  Result Value Ref Range   Troponin I 0.08 (H) <0.031 ng/mL    Comment:        PERSISTENTLY INCREASED TROPONIN VALUES IN THE RANGE OF 0.04-0.49 ng/mL CAN BE SEEN IN:       -UNSTABLE ANGINA       -CONGESTIVE HEART FAILURE       -MYOCARDITIS       -CHEST TRAUMA       -ARRYHTHMIAS       -LATE PRESENTING MYOCARDIAL INFARCTION       -COPD   CLINICAL FOLLOW-UP RECOMMENDED. Please note change in reference range.   CBC with Differential     Status: Abnormal   Collection Time: 03/11/14 11:37 PM  Result Value Ref Range   WBC 13.0 (H) 4.0 - 10.5 K/uL   RBC 4.86 4.22 - 5.81 MIL/uL   Hemoglobin 14.4 13.0 - 17.0 g/dL   HCT 43.2 39.0 - 52.0 %   MCV 88.9 78.0 - 100.0 fL   MCH 29.6 26.0 - 34.0 pg    MCHC 33.3 30.0 - 36.0 g/dL   RDW 13.1 11.5 - 15.5 %   Platelets 189 150 - 400 K/uL   Neutrophils Relative % 76 43 - 77 %  Neutro Abs 10.1 (H) 1.7 - 7.7 K/uL   Lymphocytes Relative 16 12 - 46 %   Lymphs Abs 2.0 0.7 - 4.0 K/uL   Monocytes Relative 5 3 - 12 %   Monocytes Absolute 0.7 0.1 - 1.0 K/uL   Eosinophils Relative 2 0 - 5 %   Eosinophils Absolute 0.2 0.0 - 0.7 K/uL   Basophils Relative 1 0 - 1 %   Basophils Absolute 0.1 0.0 - 0.1 K/uL  Magnesium     Status: None   Collection Time: 03/11/14 11:37 PM  Result Value Ref Range   Magnesium 2.0 1.5 - 2.5 mg/dL  I-Stat CG4 Lactic Acid, ED     Status: Abnormal   Collection Time: 03/11/14 11:54 PM  Result Value Ref Range   Lactic Acid, Venous 4.33 (H) 0.5 - 2.2 mmol/L  I-STAT, chem 8     Status: Abnormal   Collection Time: 03/12/14 12:00 AM  Result Value Ref Range   Sodium 142 135 - 145 mmol/L   Potassium 3.7 3.5 - 5.1 mmol/L   Chloride 101 96 - 112 mEq/L   BUN 20 6 - 23 mg/dL   Creatinine, Ser 1.50 (H) 0.50 - 1.35 mg/dL   Glucose, Bld 167 (H) 70 - 99 mg/dL   Calcium, Ion 1.16 1.12 - 1.23 mmol/L   TCO2 21 0 - 100 mmol/L   Hemoglobin 15.0 13.0 - 17.0 g/dL   HCT 44.0 39.0 - 52.0 %  Urinalysis, Routine w reflex microscopic     Status: Abnormal   Collection Time: 03/12/14 12:27 AM  Result Value Ref Range   Color, Urine YELLOW YELLOW   APPearance CLOUDY (A) CLEAR   Specific Gravity, Urine 1.011 1.005 - 1.030   pH 6.5 5.0 - 8.0   Glucose, UA NEGATIVE NEGATIVE mg/dL   Hgb urine dipstick SMALL (A) NEGATIVE   Bilirubin Urine NEGATIVE NEGATIVE   Ketones, ur NEGATIVE NEGATIVE mg/dL   Protein, ur 100 (A) NEGATIVE mg/dL   Urobilinogen, UA 0.2 0.0 - 1.0 mg/dL   Nitrite NEGATIVE NEGATIVE   Leukocytes, UA NEGATIVE NEGATIVE  Urine microscopic-add on     Status: Abnormal   Collection Time: 03/12/14 12:27 AM  Result Value Ref Range   Squamous Epithelial / LPF RARE RARE   WBC, UA 0-2 <3 WBC/hpf   RBC / HPF 3-6 <3 RBC/hpf   Bacteria, UA  FEW (A) RARE   Casts GRANULAR CAST (A) NEGATIVE    Comment: HYALINE CASTS   Urine-Other SPERM PRESENT   I-Stat arterial blood gas, ED     Status: Abnormal   Collection Time: 03/12/14 12:40 AM  Result Value Ref Range   pH, Arterial 7.308 (L) 7.350 - 7.450   pCO2 arterial 47.9 (H) 35.0 - 45.0 mmHg   pO2, Arterial 82.0 80.0 - 100.0 mmHg   Bicarbonate 24.1 (H) 20.0 - 24.0 mEq/L   TCO2 26 0 - 100 mmol/L   O2 Saturation 95.0 %   Acid-base deficit 3.0 (H) 0.0 - 2.0 mmol/L   Patient temperature 97.9 F    Collection site RADIAL, ALLEN'S TEST ACCEPTABLE    Drawn by RT    Sample type ARTERIAL   Glucose, capillary     Status: Abnormal   Collection Time: 03/12/14  2:09 AM  Result Value Ref Range   Glucose-Capillary 174 (H) 70 - 99 mg/dL   Comment 1 Capillary Sample   MRSA PCR Screening     Status: None   Collection Time: 03/12/14  2:12 AM  Result Value Ref Range   MRSA by PCR NEGATIVE NEGATIVE    Comment:        The GeneXpert MRSA Assay (FDA approved for NASAL specimens only), is one component of a comprehensive MRSA colonization surveillance program. It is not intended to diagnose MRSA infection nor to guide or monitor treatment for MRSA infections.   Protime-INR now and repeat in 8 hours     Status: None   Collection Time: 03/12/14  3:00 AM  Result Value Ref Range   Prothrombin Time 15.1 11.6 - 15.2 seconds   INR 1.17 0.00 - 1.49  APTT now and repeat in 8 hours     Status: None   Collection Time: 03/12/14  3:00 AM  Result Value Ref Range   aPTT 34 24 - 37 seconds  Basic metabolic panel     Status: Abnormal   Collection Time: 03/12/14  3:00 AM  Result Value Ref Range   Sodium 139 135 - 145 mmol/L    Comment: Please note change in reference range.   Potassium 3.2 (L) 3.5 - 5.1 mmol/L    Comment: Please note change in reference range. DELTA CHECK NOTED    Chloride 106 96 - 112 mEq/L   CO2 21 19 - 32 mmol/L   Glucose, Bld 204 (H) 70 - 99 mg/dL   BUN 14 6 - 23 mg/dL    Creatinine, Ser 1.04 0.50 - 1.35 mg/dL   Calcium 8.3 (L) 8.4 - 10.5 mg/dL   GFR calc non Af Amer 82 (L) >90 mL/min   GFR calc Af Amer >90 >90 mL/min    Comment: (NOTE) The eGFR has been calculated using the CKD EPI equation. This calculation has not been validated in all clinical situations. eGFR's persistently <90 mL/min signify possible Chronic Kidney Disease.    Anion gap 12 5 - 15  Glucose, capillary     Status: Abnormal   Collection Time: 03/12/14  3:19 AM  Result Value Ref Range   Glucose-Capillary 192 (H) 70 - 99 mg/dL   Comment 1 Arterial Sample   I-STAT 3, arterial blood gas (G3+)     Status: Abnormal   Collection Time: 03/12/14  3:39 AM  Result Value Ref Range   pH, Arterial 7.343 (L) 7.350 - 7.450   pCO2 arterial 35.3 35.0 - 45.0 mmHg   pO2, Arterial 63.0 (L) 80.0 - 100.0 mmHg   Bicarbonate 19.6 (L) 20.0 - 24.0 mEq/L   TCO2 21 0 - 100 mmol/L   O2 Saturation 93.0 %   Acid-base deficit 6.0 (H) 0.0 - 2.0 mmol/L   Patient temperature 95.5 F    Collection site RADIAL, ALLEN'S TEST ACCEPTABLE    Drawn by Operator    Sample type ARTERIAL   Troponin I     Status: Abnormal   Collection Time: 03/12/14  4:01 AM  Result Value Ref Range   Troponin I 0.44 (H) <0.031 ng/mL    Comment:        PERSISTENTLY INCREASED TROPONIN VALUES IN THE RANGE OF 0.04-0.49 ng/mL CAN BE SEEN IN:       -UNSTABLE ANGINA       -CONGESTIVE HEART FAILURE       -MYOCARDITIS       -CHEST TRAUMA       -ARRYHTHMIAS       -LATE PRESENTING MYOCARDIAL INFARCTION       -COPD   CLINICAL FOLLOW-UP RECOMMENDED. Please note change in reference range.   Glucose, capillary  Status: Abnormal   Collection Time: 03/12/14  4:14 AM  Result Value Ref Range   Glucose-Capillary 210 (H) 70 - 99 mg/dL   Comment 1 Arterial Sample   Lactic acid, plasma     Status: Abnormal   Collection Time: 03/12/14  4:15 AM  Result Value Ref Range   Lactic Acid, Venous 2.6 (H) 0.5 - 2.2 mmol/L  Basic metabolic panel      Status: Abnormal   Collection Time: 03/12/14  5:30 AM  Result Value Ref Range   Sodium 138 135 - 145 mmol/L    Comment: Please note change in reference range.   Potassium 3.2 (L) 3.5 - 5.1 mmol/L    Comment: Please note change in reference range.   Chloride 107 96 - 112 mEq/L   CO2 19 19 - 32 mmol/L   Glucose, Bld 209 (H) 70 - 99 mg/dL   BUN 14 6 - 23 mg/dL   Creatinine, Ser 1.04 0.50 - 1.35 mg/dL   Calcium 8.4 8.4 - 10.5 mg/dL   GFR calc non Af Amer 82 (L) >90 mL/min   GFR calc Af Amer >90 >90 mL/min    Comment: (NOTE) The eGFR has been calculated using the CKD EPI equation. This calculation has not been validated in all clinical situations. eGFR's persistently <90 mL/min signify possible Chronic Kidney Disease.    Anion gap 12 5 - 15  Glucose, capillary     Status: Abnormal   Collection Time: 03/12/14  5:34 AM  Result Value Ref Range   Glucose-Capillary 180 (H) 70 - 99 mg/dL   Comment 1 Arterial Sample   Blood gas, arterial     Status: Abnormal   Collection Time: 03/12/14  5:40 AM  Result Value Ref Range   FIO2 1.00 %   Delivery systems VENTILATOR    Mode PRESSURE REGULATED VOLUME CONTROL    VT 620 mL   Rate 20 resp/min   Peep/cpap 5.0 cm H20   pH, Arterial 7.449 7.350 - 7.450   pCO2 arterial 31.5 (L) 35.0 - 45.0 mmHg   pO2, Arterial 222.0 (H) 80.0 - 100.0 mmHg   Bicarbonate 22.0 20.0 - 24.0 mEq/L   TCO2 23.0 0 - 100 mmol/L   Acid-base deficit 1.8 0.0 - 2.0 mmol/L   O2 Saturation 99.7 %   Patient temperature 95.5    Collection site ARTERIAL LINE    Drawn by [id number]    Sample type ARTERIAL DRAW   Basic metabolic panel     Status: Abnormal   Collection Time: 03/12/14  6:58 AM  Result Value Ref Range   Sodium 136 135 - 145 mmol/L    Comment: Please note change in reference range.   Potassium 3.8 3.5 - 5.1 mmol/L    Comment: Please note change in reference range.   Chloride 105 96 - 112 mEq/L   CO2 20 19 - 32 mmol/L   Glucose, Bld 187 (H) 70 - 99 mg/dL   BUN 14 6  - 23 mg/dL   Creatinine, Ser 1.11 0.50 - 1.35 mg/dL   Calcium 8.1 (L) 8.4 - 10.5 mg/dL   GFR calc non Af Amer 76 (L) >90 mL/min   GFR calc Af Amer 88 (L) >90 mL/min    Comment: (NOTE) The eGFR has been calculated using the CKD EPI equation. This calculation has not been validated in all clinical situations. eGFR's persistently <90 mL/min signify possible Chronic Kidney Disease.    Anion gap 11 5 - 15  Dg Chest Port 1 View  03/12/2014   CLINICAL DATA:  Central line placement.  EXAM: PORTABLE CHEST - 1 VIEW  COMPARISON:  03/11/2014  FINDINGS: Endotracheal tube tip measures 5.3 cm above the carinal. Enteric tube tip projects over the EG junction. Interval placement of a left central venous catheter with tip over the cavoatrial junction. No pneumothorax. Persistent cardiac enlargement and perihilar infiltrates.  IMPRESSION: Left central venous catheter with tip over the cavoatrial junction. No pneumothorax. Enteric tube tip remains at the level of the EG junction. Cardiac enlargement. Bilateral perihilar infiltrates.   Electronically Signed   By: Lucienne Capers M.D.   On: 03/12/2014 03:23   Dg Chest Port 1 View  03/12/2014   CLINICAL DATA:  Post CPR to assess endotracheal tube.  EXAM: PORTABLE CHEST - 1 VIEW  COMPARISON:  None.  FINDINGS: Two films were obtained, initial image demonstrating endotracheal tube tip 9 cm above the carina and second image demonstrating endotracheal tube tip 3.9 cm above the carina. Enteric tube tip is at the level of the EG junction with proximal side hole probably in the lower esophagus. Shallow inspiration. Cardiac enlargement with normal pulmonary vascularity. Focal opacity in the left mid lung consistent with infiltration or atelectasis. No blunting of costophrenic angles. No pneumothorax. Nodule in the right mid lung probably represents a calcified granuloma.  IMPRESSION: Endotracheal tube appears to be in good position. Enteric tube is somewhat shallow with proximal  side hole probably in the distal esophageal region. Cardiac enlargement. Infiltration or atelectasis in the left mid lung.   Electronically Signed   By: Lucienne Capers M.D.   On: 03/12/2014 00:13   Dg Abd Portable 1v  03/12/2014   CLINICAL DATA:  OG placement.  EXAM: PORTABLE ABDOMEN - 1 VIEW  COMPARISON:  None.  FINDINGS: Enteric tube tip projects over the EG junction region with proximal side hole projecting over the lower esophagus. Cardiac enlargement. Infiltrates noted in the lower lungs.  IMPRESSION: Enteric tube tip projects over the EG junction with proximal side hole probably in the lower esophagus.   Electronically Signed   By: Lucienne Capers M.D.   On: 03/12/2014 03:25   Ct Portable Head W/o Cm  03/12/2014   CLINICAL DATA:  Status post CPR, cardiac arrest. Acute encephalopathy.  EXAM: CT HEAD WITHOUT CONTRAST  TECHNIQUE: Contiguous axial images were obtained from the base of the skull through the vertex without intravenous contrast.  COMPARISON:  None.  FINDINGS: The ventricles and sulci are normal. Somewhat poor gray-white matter differentiation though, there is streak artifact from life support lines. No intraparenchymal hemorrhage, mass effect nor midline shift. No acute large vascular territory infarcts.  No abnormal extra-axial fluid collections. Basal cisterns are patent.  No skull fracture. The included ocular globes and orbital contents are non-suspicious. Bilateral maxillary mucosal retention cyst, moderate paranasal sinusitis. Mastoid air cells are well aerated.  IMPRESSION: Mild loss of the expected gray-white matter differentiation, this could be artifact or reflect very early global brain edema/ anoxia. Recommend followup.   Electronically Signed   By: Elon Alas   On: 03/12/2014 06:00    Review of Systems  Unable to perform ROS: critical illness  Discussed symptoms with his wife and only pertinent positives include palpitations and tingling sensations in his hands and  fingers. Blood pressure 79/54, pulse 70, temperature 92.5 F (33.6 C), temperature source Core (Comment), resp. rate 20, height 6' (1.829 m), weight 203 lb 11.3 oz (92.4 kg), SpO2 99 %. Physical  Exam  Constitutional:  Intubated and sedated   Cardiovascular: Normal rate and regular rhythm.  Exam reveals no gallop and no friction rub.   No murmur heard. Respiratory: No respiratory distress. He has no wheezes.  Intubated   Musculoskeletal: He exhibits no edema.  Skin: Skin is warm and dry.   Abdomen - soft Extremities are warm Neuro cannot be assessed due to sedation  Assessment/Plan: Active Problems:   Cardiac arrest   VT (ventricular tachycardia)   1. Cardiac Arrest: pulseless VT. Resuscitated with CPR, epi and amio. Now on Artic Sun protocol.   2. Recurrent VT: patient has sustained recurrent VT since admission, requiring additional shocks. Last K was 3.8. Continue IV supplementation. Keep K ~4. Mg is stable at 2.0. Keep Mg >1.9. Continue IV Amiodarone. Agree with need for LHC to rule out ischemia as a possible etiology. I would suggest performing this once his VT has stabilized. 2D echo is pending. With history of sarcoid, there is high possibility that he may have cardiac involvement which may be contributing.   Will continue to treat with amiodarone.   SIMMONS, BRITTAINY 03/12/2014, 10:25 AM    EP Attending  Patient seen and examined. Agree with the findings above. The findings documented in the HPI represent my findings as well. He is critically ill with sustained VT. His VT is now slower in the 110-120 range and he is tolerating the VT reasonably well. I would suggest continuing IV amio. Once/if he awakens, cardiac MR will be planned. ICD insertion will be recommended. He has multiple VT morphologies making ablation less likely to be helpful at least at this time.  I spent 45 minutes with the patient, speaking to the family, adjusting medications and formulating a plan for  this patient's care.   Gregg Taylor,M.D. Mikle Bosworth.D.

## 2014-03-12 NOTE — ED Notes (Signed)
Dr. Friedman at bedside.

## 2014-03-12 NOTE — Progress Notes (Signed)
eLink Physician-Brief Progress Note Patient Name: LC OBLANDER DOB: 1965/09/09 MRN: [id number]   Date of Service  03/12/2014  HPI/Events of Note    eICU Interventions   Hypokalemia -repleted      Intervention Category Intermediate Interventions: Electrolyte abnormality - evaluation and management  ALVA,RAKESH V. 03/12/2014, 4:48 AM

## 2014-03-12 NOTE — Progress Notes (Signed)
Pts wife given wedding band

## 2014-03-12 NOTE — ED Notes (Signed)
Critical Care MD at bedside.  

## 2014-03-12 NOTE — ED Notes (Signed)
CG-4 and Chem-8 were reported to Dr. Denton Lank

## 2014-03-12 NOTE — Progress Notes (Signed)
While doing morning assessment pt. Went into sustained VT. Code blue initiated MD at beside immediately.1 shock delivered. Patient remained in VT. Second shock delivered. Pt. Converted out of VT. 150 mg amiodarone bolus given. See code note and MD notes for details.  Will continue to monitor closely and update as needed.

## 2014-03-12 NOTE — ED Notes (Signed)
Pt on vent.  No resp effort at this time.  Family remains at bedside.  Ice packs in place under bil arms, groin, neck and back   EMS gave pt 1.5 liters iced normal saline enroute to ED

## 2014-03-12 NOTE — ED Notes (Signed)
Cold Cool activated.per Critical Care

## 2014-03-12 NOTE — H&P (Signed)
PULMONARY / CRITICAL CARE MEDICINE   Name: Christopher Patrick MRN: [id number] DOB: [birth year]    ADMISSION DATE:  03/11/2014 CONSULTATION DATE:  03/11/2014  REFERRING MD :  ED - Preston Fleeting, MD  CHIEF COMPLAINT:  Cardiac arrest  INITIAL PRESENTATION: 49 year old man with history of sarcoid and GERD presenting s/p cardiac arrest.  STUDIES:  CXR 1/6 >> Cardiac enlargement, infiltration or atelectasis in the left mid lung.  SIGNIFICANT EVENTS: 1/6 >> cardiac arrest, intubated, started on amio gtt and lidocaine gtt,    HISTORY OF PRESENT ILLNESS:  49 year old man with history of sarcoid and GERD presenting s/p cardiac arrest. Per family, he had hand paresthesias and palpitations a day ago but otherwise was in his usual state of health. Recently started on PPI 2 days ago. Tonight he was sitting on the couch. He told his wife he didn't feel like he had control and shortly after had LOC. No head trauma. Upon EMS arrival, he became pulseless and CPR was initiated at 2227. He underwent CPR for 15 minutes (total CPR 20 minutes). He was defibrillated x 3 and received 2 rounds of epi. He also received 150mg  of amiodarone. He was intubated in the ED and given an additional 150mg  of amiodarone. He received 2g of IV magnesium for prolonged QTc. He was seen by cardiology and started on amio gtt and lidocaine gtt.  PAST MEDICAL HISTORY :   has no past medical history on file.  has no past surgical history on file. Prior to Admission medications   Not on File   Allergies not on file  FAMILY HISTORY:  has no family status information on file.  SOCIAL HISTORY:  reports that he does not drink alcohol.  REVIEW OF SYSTEMS:  Unable to obtain 2/2 mental status  SUBJECTIVE: Intubated, unresponsive  VITAL SIGNS: Temp:  [97.9 F (36.6 C)] 97.9 F (36.6 C) (01/06 0015) Pulse Rate:  [61-110] 61 (01/06 0015) Resp:  [16-28] 16 (01/06 0015) BP: (111-128)/(72-87) 115/87 mmHg (01/06 0015) SpO2:  [90 %-100 %] 99 %  (01/06 0015) FiO2 (%):  [100 %] 100 % (01/05 2355) HEMODYNAMICS:   VENTILATOR SETTINGS: Vent Mode:  [-] PRVC FiO2 (%):  [100 %] 100 % Set Rate:  [16 bmp] 16 bmp Vt Set:  [620 mL] 620 mL PEEP:  [5 cmH20] 5 cmH20 Plateau Pressure:  [21 cmH20] 21 cmH20 INTAKE / OUTPUT: No intake or output data in the 24 hours ending 03/12/14 0051  PHYSICAL EXAMINATION: General:  Intubated, unresponsive Neuro:  Unresponsive. No spontaneous movement. PERRL.  HEENT:  [place]/AT, ETT in place Cardiovascular:  RRR, no m/r/g Lungs:  Mechanical breath sounds bilaterally Abdomen:  Soft, nondistended Musculoskeletal:  Cool, no LE edema Skin:  No lesions  LABS:  CBC  Recent Labs Lab 03/11/14 2337  WBC 13.0*  HGB 14.4  HCT 43.2  PLT 189   Coag's No results for input(s): APTT, INR in the last 168 hours. BMET  Recent Labs Lab 03/11/14 2337  NA 140  K 4.1  CL 107  CO2 22  BUN 15  CREATININE 1.50*  GLUCOSE 173*   Electrolytes  Recent Labs Lab 03/11/14 2337  CALCIUM 8.8  MG 2.0   Sepsis Markers  Recent Labs Lab 03/11/14 2354  LATICACIDVEN 4.33*   ABG  Recent Labs Lab 03/12/14 0040  PHART 7.308*  PCO2ART 47.9*  PO2ART 82.0   Liver Enzymes No results for input(s): AST, ALT, ALKPHOS, BILITOT, ALBUMIN in the last 168 hours. Cardiac Enzymes  Recent Labs Lab  03/11/14 2337  TROPONINI 0.08*   Glucose No results for input(s): GLUCAP in the last 168 hours.  Imaging No results found.   ASSESSMENT / PLAN:  PULMONARY ETT 1/6 >> A: Acute respiratory failure P:   Full vent support ABG CXR pending  CARDIOVASCULAR CVL L IJ 1/6 >> Arterial line 1/6>> A: s/p cardiac arrest, recurrent V tach 1st degree heart block, R BBB ?sarcoid involvement of cardiac conduction system Lactic acidosis P:  Cardiology consulted Hypothermia protocol Repeat EKG in AM Cycle troponins 2D echo ASA daily Lidocaine gtt per cardiology Amiodarone gtt per cardiology Levophed gtt, titrate  to MAP goal 80 Repeat lactic acid  RENAL A:  AKI P:   Follow BMP  GASTROINTESTINAL A:  Nutrition P:   NPO OG in place Consider tube feeds in AM Protonix daily for SUP  HEMATOLOGIC A:  DVT ppx P:  SCDs Follow coags and CBC  INFECTIOUS A:  No acute issues P:   Continue to monitor  ENDOCRINE A:  No acute issues P:   CBG Q4hr Continue to monitor  NEUROLOGIC A:  Acute metabolic encephalopathy P:   RASS goal: -5 fent gtt, fent prn Versed gtt, versed prn CT head pending EEG pending   FAMILY  - Updates: Spouse, Shelly, updated.  - Inter-disciplinary family meet or Palliative Care meeting due by:  03/19/2014   [name], MD  Pulmonary and Critical Care Medicine [facility] Pager: [phone]  03/12/2014, 12:51 AM   PCCM ATTENDING: I have reviewed pt's initial presentation, consultants notes and hospital database in detail.  The above assessment and plan was formulated under my direction.  In summary: Previously healthy 26 M with VT arrest requiring multiple shock and prolonged ACLS/CPR. Now on hypothermia protocol. Has had recurrent VT - multifocal, monomorphic.  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 I have little information to go on to support the diagnosis of sarcoidosis. He does have a CT chest from 2013 demonstrating LUL granulomatous findings but the appearance is certainly not classic for sarcoidosis. It is not clear if he has ever had a biopsy to support this diagnosis. We are trying to obtain records from [facility] to understand this more completely. 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

## 2014-03-12 NOTE — Progress Notes (Signed)
Pt went into sustained VT. Shocked successfully. MD made aware. Will continue to monitor closely and update as needed.

## 2014-03-12 NOTE — ED Notes (Signed)
Iced Normal Saline infusing at this time.

## 2014-03-12 NOTE — ED Notes (Signed)
Pt continues to have runs of V-Tach with pulses. Dr. Zachery Conch at bedside.

## 2014-03-12 NOTE — ED Notes (Signed)
Family at bedside. 

## 2014-03-12 NOTE — Care Management Note (Signed)
    Page 1 of 1   03/12/2014     9:26:19 AM CARE MANAGEMENT NOTE 03/12/2014  Patient:  Christopher Patrick, Christopher Patrick   Account Number:  [id number]  Date Initiated:  03/12/2014  Documentation initiated by:  Junius Creamer  Subjective/Objective Assessment:   adm w cardiac arrest-vent     Action/Plan:   lives w wife   Anticipated DC Date:     Anticipated DC Plan:           Choice offered to / List presented to:             Status of service:   Medicare Important Message given?   (If response is "NO", the following Medicare IM given date fields will be blank) Date Medicare IM given:   Medicare IM given by:   Date Additional Medicare IM given:   Additional Medicare IM given by:    Discharge Disposition:    Per UR Regulation:  Reviewed for med. necessity/level of care/duration of stay  If discussed at Long Length of Stay Meetings, dates discussed:    Comments:

## 2014-03-12 NOTE — ED Notes (Signed)
Dr. Glori Luis at bedside.

## 2014-03-12 NOTE — ED Provider Notes (Signed)
CSN: [id number]     Arrival date & time 03/11/14  2324 History   First MD Initiated Contact with Patient 03/11/14 2334     Chief Complaint  Patient presents with  . Code STEMI   PROCEDURE NOTE  (Consider location/radiation/quality/duration/timing/severity/associated sxs/prior Treatment) HPI    See Dr. Reynolds Bowl History and physical for more information.  No past medical history on file. No past surgical history on file. No family history on file. History  Substance Use Topics  . Smoking status: Not on file  . Smokeless tobacco: Not on file  . Alcohol Use: Not on file    Review of Systems    Allergies  Review of patient's allergies indicates not on file.  Home Medications   Prior to Admission medications   Not on File   BP 111/72 mmHg  Resp 19  SpO2 100% Physical Exam  ED Course  INTUBATION Date/Time: 03/12/2014 12:05 AM Performed by: Rhae Lerner Authorized by: Rhae Lerner Consent: The procedure was performed in an emergent situation. Verbal consent not obtained. Required items: required blood products, implants, devices, and special equipment available Patient identity confirmed: arm band Time out: Immediately prior to procedure a "time out" was called to verify the correct patient, procedure, equipment, support staff and site/side marked as required. Indications: respiratory failure and  airway protection Intubation method: video-assisted Patient status: paralyzed (RSI) Preoxygenation: king airway. Sedatives: etomidate Paralytic: rocuronium Tube type: cuffed Number of attempts: 1 Cords visualized: yes Post-procedure assessment: chest rise and ETCO2 monitor Breath sounds: equal Cuff inflated: yes ETT to lip: 22 cm Tube secured with: ETT holder Chest x-ray interpreted by me. Chest x-ray findings: endotracheal tube too high Tube repositioned: tube repositioned successfully Patient tolerance: Patient tolerated the procedure  well with no immediate complications   (including critical care time) Labs Review Labs Reviewed  CBC WITH DIFFERENTIAL - Abnormal; Notable for the following:    WBC 13.0 (*)    Neutro Abs 10.1 (*)    All other components within normal limits  I-STAT CG4 LACTIC ACID, ED - Abnormal; Notable for the following:    Lactic Acid, Venous 4.33 (*)    All other components within normal limits  BASIC METABOLIC PANEL  TROPONIN I  I-STAT CHEM 8, ED    Imaging Review No results found.   EKG Interpretation None      MDM   Final diagnoses:  Cardiac arrest   Intuabation Procedure note: 49 yo male s/p vfib arrest-please see Dr. Reynolds Bowl history, physical and MDM for more information.   Rhae Lerner, MD 03/12/14 0370  Dione Booze, MD 03/12/14 0100

## 2014-03-12 NOTE — Progress Notes (Signed)
Pt went into sustained VT. MD at bedside. Shock delivered. Amiodarone gtt increased in 150 mg bolus giver per MD order. Will cont to monitor and update as needed.

## 2014-03-12 NOTE — ED Notes (Signed)
Pt having long runs of V-Tach.

## 2014-03-12 NOTE — Progress Notes (Signed)
MD aware of pt's K at 3.8.  No orders received at this time.  Will recheck at 2200 per protocol.

## 2014-03-13 ENCOUNTER — Inpatient Hospital Stay (HOSPITAL_COMMUNITY): Payer: 59

## 2014-03-13 DIAGNOSIS — I429 Cardiomyopathy, unspecified: Secondary | ICD-10-CM

## 2014-03-13 DIAGNOSIS — R579 Shock, unspecified: Secondary | ICD-10-CM

## 2014-03-13 LAB — CBC WITH DIFFERENTIAL/PLATELET
Basophils Absolute: 0 10*3/uL (ref 0.0–0.1)
Basophils Relative: 0 % (ref 0–1)
EOS PCT: 0 % (ref 0–5)
Eosinophils Absolute: 0 10*3/uL (ref 0.0–0.7)
HEMATOCRIT: 44.6 % (ref 39.0–52.0)
Hemoglobin: 15.7 g/dL (ref 13.0–17.0)
LYMPHS ABS: 0.4 10*3/uL — AB (ref 0.7–4.0)
LYMPHS PCT: 2 % — AB (ref 12–46)
MCH: 30.1 pg (ref 26.0–34.0)
MCHC: 35.2 g/dL (ref 30.0–36.0)
MCV: 85.6 fL (ref 78.0–100.0)
MONO ABS: 0.6 10*3/uL (ref 0.1–1.0)
Monocytes Relative: 4 % (ref 3–12)
Neutro Abs: 16.6 10*3/uL — ABNORMAL HIGH (ref 1.7–7.7)
Neutrophils Relative %: 94 % — ABNORMAL HIGH (ref 43–77)
Platelets: 173 10*3/uL (ref 150–400)
RBC: 5.21 MIL/uL (ref 4.22–5.81)
RDW: 13 % (ref 11.5–15.5)
WBC: 17.6 10*3/uL — AB (ref 4.0–10.5)

## 2014-03-13 LAB — BASIC METABOLIC PANEL
ANION GAP: 12 (ref 5–15)
Anion gap: 14 (ref 5–15)
Anion gap: 9 (ref 5–15)
Anion gap: 9 (ref 5–15)
BUN: 21 mg/dL (ref 6–23)
BUN: 24 mg/dL — ABNORMAL HIGH (ref 6–23)
BUN: 78 mg/dL — ABNORMAL HIGH (ref 6–23)
BUN: 27 mg/dL — ABNORMAL HIGH (ref 6–23)
CALCIUM: 8.4 mg/dL (ref 8.4–10.5)
CALCIUM: 9.4 mg/dL (ref 8.4–10.5)
CHLORIDE: 100 meq/L (ref 96–112)
CHLORIDE: 99 meq/L (ref 96–112)
CHLORIDE: 100 meq/L (ref 96–112)
CO2: 17 mmol/L — AB (ref 19–32)
CO2: 20 mmol/L (ref 19–32)
CO2: 24 mmol/L (ref 19–32)
CO2: 20 mmol/L (ref 19–32)
CREATININE: 1.33 mg/dL (ref 0.50–1.35)
Calcium: 8.6 mg/dL (ref 8.4–10.5)
Calcium: 8.4 mg/dL (ref 8.4–10.5)
Chloride: 100 mEq/L (ref 96–112)
Creatinine, Ser: 1.41 mg/dL — ABNORMAL HIGH (ref 0.50–1.35)
Creatinine, Ser: 1.71 mg/dL — ABNORMAL HIGH (ref 0.50–1.35)
Creatinine, Ser: 1.58 mg/dL — ABNORMAL HIGH (ref 0.50–1.35)
GFR calc Af Amer: 71 mL/min — ABNORMAL LOW (ref >90)
GFR calc Af Amer: 66 mL/min — ABNORMAL LOW (ref >90)
GFR calc Af Amer: 52 mL/min — ABNORMAL LOW (ref >90)
GFR calc Af Amer: 57 mL/min — ABNORMAL LOW (ref >90)
GFR calc non Af Amer: 57 mL/min — ABNORMAL LOW (ref >90)
GFR, EST NON AFRICAN AMERICAN: 61 mL/min — AB (ref >90)
GFR, EST NON AFRICAN AMERICAN: 45 mL/min — AB (ref >90)
GFR, EST NON AFRICAN AMERICAN: 49 mL/min — AB (ref >90)
GLUCOSE: 255 mg/dL — AB (ref 70–99)
GLUCOSE: 184 mg/dL — AB (ref 70–99)
GLUCOSE: 122 mg/dL — AB (ref 70–99)
Glucose, Bld: 153 mg/dL — ABNORMAL HIGH (ref 70–99)
Potassium: 3.8 mmol/L (ref 3.5–5.1)
Potassium: 4.3 mmol/L (ref 3.5–5.1)
Potassium: 4.3 mmol/L (ref 3.5–5.1)
Potassium: 4.7 mmol/L (ref 3.5–5.1)
SODIUM: 131 mmol/L — AB (ref 135–145)
SODIUM: 132 mmol/L — AB (ref 135–145)
Sodium: 132 mmol/L — ABNORMAL LOW (ref 135–145)
Sodium: 129 mmol/L — ABNORMAL LOW (ref 135–145)

## 2014-03-13 LAB — COMPREHENSIVE METABOLIC PANEL
ALT: 66 U/L — ABNORMAL HIGH (ref 0–53)
ANION GAP: 13 (ref 5–15)
AST: 47 U/L — ABNORMAL HIGH (ref 0–37)
Albumin: 3.4 g/dL — ABNORMAL LOW (ref 3.5–5.2)
Alkaline Phosphatase: 76 U/L (ref 39–117)
BILIRUBIN TOTAL: 0.9 mg/dL (ref 0.3–1.2)
BUN: 22 mg/dL (ref 6–23)
CO2: 18 mmol/L — ABNORMAL LOW (ref 19–32)
CREATININE: 1.38 mg/dL — AB (ref 0.50–1.35)
Calcium: 8.5 mg/dL (ref 8.4–10.5)
Chloride: 100 mEq/L (ref 96–112)
GFR, EST AFRICAN AMERICAN: 67 mL/min — AB (ref >90)
GFR, EST NON AFRICAN AMERICAN: 58 mL/min — AB (ref >90)
Glucose, Bld: 251 mg/dL — ABNORMAL HIGH (ref 70–99)
POTASSIUM: 3.8 mmol/L (ref 3.5–5.1)
Sodium: 131 mmol/L — ABNORMAL LOW (ref 135–145)
Total Protein: 6.4 g/dL (ref 6.0–8.3)

## 2014-03-13 LAB — BLOOD GAS, ARTERIAL
Acid-base deficit: 9.3 mmol/L — ABNORMAL HIGH (ref 0.0–2.0)
BICARBONATE: 17.1 meq/L — AB (ref 20.0–24.0)
Drawn by: 39898
FIO2: 0.6 %
LHR: 16 {breaths}/min
MECHVT: 600 mL
O2 SAT: 96.4 %
PATIENT TEMPERATURE: 98.6
PEEP/CPAP: 5 cmH2O
TCO2: 18.4 mmol/L (ref 0–100)
pCO2 arterial: 43.8 mmHg (ref 35.0–45.0)
pH, Arterial: 7.216 — ABNORMAL LOW (ref 7.350–7.450)
pO2, Arterial: 101 mmHg — ABNORMAL HIGH (ref 80.0–100.0)

## 2014-03-13 LAB — LACTIC ACID, PLASMA: Lactic Acid, Venous: 6.5 mmol/L — ABNORMAL HIGH (ref 0.5–2.2)

## 2014-03-13 LAB — GLUCOSE, CAPILLARY
GLUCOSE-CAPILLARY: 120 mg/dL — AB (ref 70–99)
Glucose-Capillary: 220 mg/dL — ABNORMAL HIGH (ref 70–99)
Glucose-Capillary: 221 mg/dL — ABNORMAL HIGH (ref 70–99)
Glucose-Capillary: 144 mg/dL — ABNORMAL HIGH (ref 70–99)
Glucose-Capillary: 107 mg/dL — ABNORMAL HIGH (ref 70–99)

## 2014-03-13 LAB — CARBOXYHEMOGLOBIN
CARBOXYHEMOGLOBIN: 0.6 % (ref 0.5–1.5)
Methemoglobin: 1 % (ref 0.0–1.5)
O2 Saturation: 82.1 %
Total hemoglobin: 18.7 g/dL — ABNORMAL HIGH (ref 13.5–18.0)

## 2014-03-13 LAB — HEMOGLOBIN A1C
Hgb A1c MFr Bld: 5.5 % (ref <5.7)
Mean Plasma Glucose: 111 mg/dL (ref <117)

## 2014-03-13 LAB — MAGNESIUM: MAGNESIUM: 1.7 mg/dL (ref 1.5–2.5)

## 2014-03-13 MED ORDER — POTASSIUM CHLORIDE 10 MEQ/100ML IV SOLN: 10.0000 meq | INTRAVENOUS | Status: DC

## 2014-03-13 MED ORDER — POTASSIUM CHLORIDE 10 MEQ/100ML IV SOLN
10.0000 meq | INTRAVENOUS | Status: AC
  Administered 2014-03-13: 10 meq via INTRAVENOUS
  Filled 2014-03-13: qty 100

## 2014-03-13 MED ORDER — LEVETIRACETAM IN NACL 1000 MG/100ML IV SOLN
1000.0000 mg | Freq: Once | INTRAVENOUS | Status: AC
  Administered 2014-03-13: 1000 mg via INTRAVENOUS
  Filled 2014-03-13: qty 100

## 2014-03-13 MED ORDER — LORAZEPAM 2 MG/ML IJ SOLN
2.0000 mg | INTRAMUSCULAR | Status: DC | PRN
  Administered 2014-03-15: 2 mg via INTRAVENOUS
  Filled 2014-03-13: qty 1

## 2014-03-13 MED ORDER — MAGNESIUM SULFATE IN D5W 10-5 MG/ML-% IV SOLN
1.0000 g | Freq: Once | INTRAVENOUS | Status: AC
Start: 2014-03-13 — End: 2014-03-13
  Administered 2014-03-13: 1 g via INTRAVENOUS
  Filled 2014-03-13: qty 100

## 2014-03-13 MED ORDER — AMIODARONE HCL IN DEXTROSE 360-4.14 MG/200ML-% IV SOLN
30.0000 mg/h | INTRAVENOUS | Status: AC
Start: 2014-03-13 — End: 2014-03-13
  Filled 2014-03-13: qty 200

## 2014-03-13 MED ORDER — LEVETIRACETAM IN NACL 500 MG/100ML IV SOLN
500.0000 mg | Freq: Two times a day (BID) | INTRAVENOUS | Status: DC
  Administered 2014-03-14 – 2014-03-15 (×4): 500 mg via INTRAVENOUS
  Filled 2014-03-13 (×5): qty 100

## 2014-03-13 MED ORDER — MIDAZOLAM HCL 2 MG/2ML IJ SOLN
INTRAMUSCULAR | Status: AC
  Filled 2014-03-13: qty 2

## 2014-03-13 MED ORDER — AMIODARONE HCL IN DEXTROSE 360-4.14 MG/200ML-% IV SOLN
30.0000 mg/h | INTRAVENOUS | Status: DC
  Administered 2014-03-13 – 2014-03-14 (×3): 30 mg/h via INTRAVENOUS
  Filled 2014-03-13 (×5): qty 200

## 2014-03-13 MED FILL — Medication: Qty: 1 | Status: AC

## 2014-03-13 NOTE — Progress Notes (Signed)
**Note De-identified  Obfuscation** Sputum collected and sent to lab 

## 2014-03-13 NOTE — Progress Notes (Signed)
Inpatient Diabetes Program Recommendations  AACE/ADA: New Consensus Statement on Inpatient Glycemic Control (2013)  Target Ranges:  Prepandial:   less than 140 mg/dL      Peak postprandial:   less than 180 mg/dL (1-2 hours)      Critically ill patients:  140 - 180 mg/dL  Results for NEVIL, ANGER (MRN [id number]) as of 03/13/2014 10:59  Ref. Range 03/12/2014 21:58 03/12/2014 23:41 03/13/2014 03:41 03/13/2014 08:01  Glucose-Capillary Latest Range: 70-99 mg/dL 350 (H) 093 (H) 818 (H) 221 (H)   Inpatient Diabetes Program Recommendations Insulin - Basal: add Lantus 15 units  HgbA1C: order to assess prehospital glucose control  Thank you  Piedad Climes BSN, RN,CDE Inpatient Diabetes Coordinator [phone] (team pager)

## 2014-03-13 NOTE — Procedures (Signed)
ELECTROENCEPHALOGRAM REPORT   Patient: Christopher Patrick       Room #: 2H-06 EEG No. ID: [id number] Age: 49 y.o.        Sex: male Referring Physician: Dr Sung Amabile Report Date:  03/13/2014        Interpreting Physician: Elspeth Cho, Jill Alexanders  History: Christopher Patrick is an 49 y.o. male with no prior cardiac history. His PMH is significant for sarcoid with known pulmonary involement and GERD. He presented to [facility]/11/14 after sustaining a witnessed out of hospital cardiac arrest at home. Upon EMS arrival, he became pulseless. Underlying rhythm was VT. CPR was initiated for a duration of 20 minutes. He was defibrillated x 3 and received 2 rounds of epi, a bolus of 150 mg of Amiodarone and started on a lidocaine drip. He was intubated in the ED and placed on ARTIC SUN Protocol. He was transferred to the CCU. Since admission, he has had recurrent VT in the unit requiring additional shocks.  Medications:  Scheduled: . antiseptic oral rinse  7 mL Mouth Rinse QID  . artificial tears  1 application Both Eyes 3 times per day  . chlorhexidine  15 mL Mouth Rinse BID  . famotidine (PEPCID) IV  20 mg Intravenous Q12H  . heparin subcutaneous  5,000 Units Subcutaneous 3 times per day  . insulin aspart  0-15 Units Subcutaneous 6 times per day  . [START ON 03/14/2014] levETIRAcetam  500 mg Intravenous Q12H  . methylPREDNISolone (SOLU-MEDROL) injection  80 mg Intravenous Q12H    Conditions of Recording:  This is a 16 channel EEG carried out with the patient in the drowsy state.  Description:  The waking background activity consists predominantly of a low voltage theta activity. There is intermittent poorly sustained posterior alpha activity in the 9-10 Hz frequency. Low voltage fast activity, poorly organized, is seen anteriorly and is at times superimposed on more posterior regions.  A mixture of theta and alpha rhythms are seen from the central and temporal regions. No sharp wave, focal slowing or  epileptiform activity is noted.   The patient appears drowsy throughout with slowing to irregular, low voltage theta and beta activity.  Stage II sleep is not obtained.\  Hyperventilation and photic stimulation was not performed.    IMPRESSION: Borderline normal EEG. Generalized low voltage slowing may be noted in a drowsy patient or related to polypharmacy.    Elspeth Cho, DO [facility] [phone]  If 7pm- 7am, please page neurology on call as listed in AMION. 03/13/2014, 6:41 PM

## 2014-03-13 NOTE — Consult Note (Addendum)
Consult Reason for Consult:cardiac arrest Referring Physician: Dr Sung Amabile  CC: cardiac arrest  HPI: Christopher Patrick is an 49 y.o. male with no prior cardiac history. His PMH is significant for sarcoid with known pulmonary involement and GERD. He presented to [facility] 03/11/14 after sustaining a witnessed out of hospital cardiac arrest at home. Upon EMS arrival, he became pulseless. Underlying rhythm was VT. CPR was initiated for a duration of 20 minutes. He was defibrillated x 3 and received 2 rounds of epi, a bolus of 150 mg of Amiodarone and started on a lidocaine drip. He was intubated in the ED and placed on ARTIC SUN Protocol. He was transferred to the CCU. Since admission, he has had recurrent VT in the unit requiring additional shocks.   Rewarming started today and patient noted to have RUE and RLE twitching. Versed gtt increased to 4mg , loaded with keppra 1000mg  and started on maintenance dose of 500mg  Q12. EEG ordered. Per RN the episode lasted around 10 minutes with GTC seizure activity R>L side. When EEG techs arrived they noted he was following commands.   Portable CT head shows mild loss of expected gray-white matter (imaging reviewed).   Past Medical History  Diagnosis Date  . Sarcoid   . GERD (gastroesophageal reflux disease)     Past Surgical History  Procedure Laterality Date  . Left heart catheterization with coronary angiogram N/A 03/12/2014    Procedure: LEFT HEART CATHETERIZATION WITH CORONARY ANGIOGRAM;  Surgeon: Iran Ouch, MD;  Location: [facility] CATH LAB;  Service: Cardiovascular;  Laterality: N/A;    No family history on file.  Social History:  reports that he does not drink alcohol. His tobacco and drug histories are not on file.  No Known Allergies  Medications:  Scheduled: . antiseptic oral rinse  7 mL Mouth Rinse QID  . artificial tears  1 application Both Eyes 3 times per day  . chlorhexidine  15 mL Mouth Rinse BID  . famotidine (PEPCID) IV  20 mg  Intravenous Q12H  . heparin subcutaneous  5,000 Units Subcutaneous 3 times per day  . insulin aspart  0-15 Units Subcutaneous 6 times per day  . levETIRAcetam  1,000 mg Intravenous Once  . [START ON 03/14/2014] levETIRAcetam  500 mg Intravenous Q12H  . methylPREDNISolone (SOLU-MEDROL) injection  80 mg Intravenous Q12H     ROS: Out of a complete 14 system review, the patient complains of only the following symptoms, and all other reviewed systems are negative. Unable to obtain due to mental status Physical Examination: Filed Vitals:   03/13/14 1500  BP: 113/63  Pulse: 81  Temp:   Resp: 22   Physical Exam  Constitutional: Intubated Psych:intubated on sedation Eyes: No scleral injection HENT: No OP obstrucion Head: Normocephalic.  Cardiovascular: Normal rate and regular rhythm.  Respiratory: Effort normal and breath sounds normal.  GI: Soft. Bowel sounds are normal. No distension. There is no tenderness.  Skin: WDI  Neurologic Examination Mental Status: Intubated, sedated, will briefly open eyes to voice. On command will move eyes to R or L, wiggle/squeeze right and left hands and wiggles R and L toes.  Cranial Nerves: II:  pupils equal, round, reactive to light  III,IV, VI: eyes midline, will move to the right and left on command V,VII: appears grossly symmetric but difficult to fully test due to intubation VIII: hearing normal bilaterally IX,X: gag reflex present EX:BMWUXL to test XII: unable to test Motor: Unable to formally test but appears to move all extremities  symmetrically and to command Tone and bulk:normal tone throughout; no atrophy noted Sensory: withdrawals symmetrically to noxious stimuli Deep Tendon Reflexes: 2+ and symmetric throughout Plantars: Right: downgoing   Left: downgoing Cerebellar: Gait: unable to test  Laboratory Studies:   Basic Metabolic Panel:  Recent Labs Lab 03/11/14 2337  03/12/14 2154 03/13/14 0203 03/13/14 0507  03/13/14 1000 03/13/14 1400  NA 140  < > 132* 131* 131* 132* 132*  K 4.1  < > 3.5 3.8 3.8 4.3 4.3  CL 107  < > 101 100 100 100 99  CO2 22  < > 17* 17* 18* 20 24  GLUCOSE 173*  < > 241* 255* 251* 184* 153*  BUN 15  < > 18 21 22  24* 78*  CREATININE 1.50*  < > 1.26 1.33 1.38* 1.41* 1.71*  CALCIUM 8.8  < > 8.2* 8.4 8.5 8.6 9.4  MG 2.0  --   --   --  1.7  --   --   < > = values in this interval not displayed.  Liver Function Tests:  Recent Labs Lab 03/13/14 0507  AST 47*  ALT 66*  ALKPHOS 76  BILITOT 0.9  PROT 6.4  ALBUMIN 3.4*   No results for input(s): LIPASE, AMYLASE in the last 168 hours. No results for input(s): AMMONIA in the last 168 hours.  CBC:  Recent Labs Lab 03/11/14 2337 03/12/14 03/12/14 0701 03/13/14 0412  WBC 13.0*  --  14.3* 17.6*  NEUTROABS 10.1*  --   --  16.6*  HGB 14.4 15.0 17.5* 15.7  HCT 43.2 44.0 49.5 44.6  MCV 88.9  --  86.1 85.6  PLT 189  --  209 173    Cardiac Enzymes:  Recent Labs Lab 03/11/14 2337 03/12/14 0401 03/12/14 1001  TROPONINI 0.08* 0.44* 0.58*    BNP: Invalid input(s): POCBNP  CBG:  Recent Labs Lab 03/12/14 2158 03/12/14 2341 03/13/14 0341 03/13/14 0801 03/13/14 1155  GLUCAP 219* 235* 220* 221* 144*    Microbiology: Results for orders placed or performed during the hospital encounter of 03/11/14  MRSA PCR Screening     Status: None   Collection Time: 03/12/14  2:12 AM  Result Value Ref Range Status   MRSA by PCR NEGATIVE NEGATIVE Final    Comment:        The GeneXpert MRSA Assay (FDA approved for NASAL specimens only), is one component of a comprehensive MRSA colonization surveillance program. It is not intended to diagnose MRSA infection nor to guide or monitor treatment for MRSA infections.     Coagulation Studies:  Recent Labs  03/12/14 0300 03/12/14 1100  LABPROT 15.1 15.8*  INR 1.17 1.25    Urinalysis:  Recent Labs Lab 03/12/14 0027  COLORURINE YELLOW  LABSPEC 1.011  PHURINE  6.5  GLUCOSEU NEGATIVE  HGBUR SMALL*  BILIRUBINUR NEGATIVE  KETONESUR NEGATIVE  PROTEINUR 100*  UROBILINOGEN 0.2  NITRITE NEGATIVE  LEUKOCYTESUR NEGATIVE    Lipid Panel:  No results found for: CHOL, TRIG, HDL, CHOLHDL, VLDL, LDLCALC  HgbA1C: No results found for: HGBA1C  Urine Drug Screen:  No results found for: LABOPIA, COCAINSCRNUR, LABBENZ, AMPHETMU, THCU, LABBARB  Alcohol Level: No results for input(s): ETH in the last 168 hours.  Other results:  Imaging: Dg Chest 1 View  03/13/2014   CLINICAL DATA:  Cardiac arrest.  Sarcoid.  EXAM: CHEST - 1 VIEW  COMPARISON:  03/12/2014  FINDINGS: Endotracheal tube in good position. Central venous catheter tip in the lower SVC. No pneumothorax.  Gastric tube in the stomach.  Improvement in left upper lobe and left lower lobe airspace disease. Improvement in right perihilar infiltrate. Progression of right middle lobe collapse. No significant effusion.  IMPRESSION: Improving bilateral infiltrates. Progression of right middle lobe collapse.   Electronically Signed   By: Marlan Palau M.D.   On: 03/13/2014 07:27   Dg Chest Port 1 View  03/12/2014   CLINICAL DATA:  Central line placement.  EXAM: PORTABLE CHEST - 1 VIEW  COMPARISON:  03/11/2014  FINDINGS: Endotracheal tube tip measures 5.3 cm above the carinal. Enteric tube tip projects over the EG junction. Interval placement of a left central venous catheter with tip over the cavoatrial junction. No pneumothorax. Persistent cardiac enlargement and perihilar infiltrates.  IMPRESSION: Left central venous catheter with tip over the cavoatrial junction. No pneumothorax. Enteric tube tip remains at the level of the EG junction. Cardiac enlargement. Bilateral perihilar infiltrates.   Electronically Signed   By: Burman Nieves M.D.   On: 03/12/2014 03:23   Dg Chest Port 1 View  03/12/2014   CLINICAL DATA:  Post CPR to assess endotracheal tube.  EXAM: PORTABLE CHEST - 1 VIEW  COMPARISON:  None.  FINDINGS: Two  films were obtained, initial image demonstrating endotracheal tube tip 9 cm above the carina and second image demonstrating endotracheal tube tip 3.9 cm above the carina. Enteric tube tip is at the level of the EG junction with proximal side hole probably in the lower esophagus. Shallow inspiration. Cardiac enlargement with normal pulmonary vascularity. Focal opacity in the left mid lung consistent with infiltration or atelectasis. No blunting of costophrenic angles. No pneumothorax. Nodule in the right mid lung probably represents a calcified granuloma.  IMPRESSION: Endotracheal tube appears to be in good position. Enteric tube is somewhat shallow with proximal side hole probably in the distal esophageal region. Cardiac enlargement. Infiltration or atelectasis in the left mid lung.   Electronically Signed   By: Burman Nieves M.D.   On: 03/12/2014 00:13   Dg Abd Portable 1v  03/12/2014   CLINICAL DATA:  OG placement.  EXAM: PORTABLE ABDOMEN - 1 VIEW  COMPARISON:  None.  FINDINGS: Enteric tube tip projects over the EG junction region with proximal side hole projecting over the lower esophagus. Cardiac enlargement. Infiltrates noted in the lower lungs.  IMPRESSION: Enteric tube tip projects over the EG junction with proximal side hole probably in the lower esophagus.   Electronically Signed   By: Burman Nieves M.D.   On: 03/12/2014 03:25   Ct Portable Head W/o Cm  03/12/2014   CLINICAL DATA:  Status post CPR, cardiac arrest. Acute encephalopathy.  EXAM: CT HEAD WITHOUT CONTRAST  TECHNIQUE: Contiguous axial images were obtained from the base of the skull through the vertex without intravenous contrast.  COMPARISON:  None.  FINDINGS: The ventricles and sulci are normal. Somewhat poor gray-white matter differentiation though, there is streak artifact from life support lines. No intraparenchymal hemorrhage, mass effect nor midline shift. No acute large vascular territory infarcts.  No abnormal extra-axial fluid  collections. Basal cisterns are patent.  No skull fracture. The included ocular globes and orbital contents are non-suspicious. Bilateral maxillary mucosal retention cyst, moderate paranasal sinusitis. Mastoid air cells are well aerated.  IMPRESSION: Mild loss of the expected gray-white matter differentiation, this could be artifact or reflect very early global brain edema/ anoxia. Recommend followup.   Electronically Signed   By: Awilda Metro   On: 03/12/2014 06:00     Assessment/Plan:  48y/o male with hx of sarcoidosis, with known pulm involvement but no cardiac history presenting with an out of hospital cardiac arrest. Hypothermia protocol was initiated upon arrival. Neurology consulted after GTC seizure activity noted upon rewarming. He was loaded with keppra and versed was increased at that time. Upon arrival RN reports patient "woke up" On exam he was noted to be consistently following commands, moving extremities to command.   EEG informal read shows generalized low voltage slowing. No noted sharp wave or epileptiform activity. Formal read to follow.   -continue keppra  Q12 -PCCM and cardiology for continued management of underlying cardiac problem -based on current exam optimistic for meaningful recovery at this time -will continue to follow  Elspeth Cho, DO [facility] [phone]  If 7pm- 7am, please page neurology on call as listed in AMION. 03/13/2014, 4:00 PM

## 2014-03-13 NOTE — Progress Notes (Signed)
EEG completed, results pending. 

## 2014-03-13 NOTE — Progress Notes (Signed)
The patient is a 49 y/o male with no prior cardiac history. His PMH is significant for sarcoid with known pulmonary involement and GERD. He presented to [facility] 03/11/14 after sustaining a witnessed out of hospital cardiac arrest at home. Upon EMS arrival, he became pulseless. Underlying rhythm was VT. CPR was initiated for a duration of 20 minutes. He was defibrillated x 3 and received 2 rounds of epi, a bolus of 150 mg of Amiodarone and started on a lidocaine drip. He was intubated in the ED and placed on ARTIC SUN Protocol. He was transferred to the CCU. Since admission, he has had recurrent VT in the unit requiring additional shocks x 2. He has received potassium supplementation. K this am was 3.2. Repeat K after supplementation was 3.8. Mg is stable at 2.0. He continues on IV Amiodarone and lidocaine. He is also on supplemental Levothroid  Subjective:  Remains intubated and on hypothermia protocol. Sedated. Apparently stabilized from VT at roughly 2 in the morning. However this morning at 8:30 for the patient went into prolonged sustained monomorphic ventricular tachycardia. He was shocked with restoration of what appears to be a junctional escape rhythm with some sinus bradycardia and AV dissociation. He subsequently went back into ventricular tachycardia requiring a second shock at 120 J. Again we restored underlying narrow complex rhythm.   Throughout the course of the morning the patient has had at least 2 more episodes of ventricular tachycardia requiring additional shocks one was at 1010, the other 1150. ELECTROPHYSIOLOGY has been counseled.  Objective:  Vital Signs in the last 24 hours: Temp:  [90.3 F (32.4 C)-95.2 F (35.1 C)] 95.2 F (35.1 C) (01/07 1100) Pulse Rate:  [51-108] 76 (01/07 1100) Resp:  [16-21] 21 (01/07 1100) BP: (67-141)/(21-101) 119/93 mmHg (01/07 1100) SpO2:  [93 %-100 %] 95 % (01/07 1100) Arterial Line BP: (55-137)/(45-91) 109/83 mmHg (01/07 1100) FiO2 (%):  [60 %]  60 % (01/07 1046) Weight:  [218 lb 0.6 oz (98.9 kg)] 218 lb 0.6 oz (98.9 kg) (01/07 0400)  Intake/Output from previous day: 01/06 0701 - 01/07 0700 In: 4546.8 [I.V.:4276.8; NG/GT:70; IV Piggyback:200] Out: 595 [Urine:445; Emesis/NG output:150] Intake/Output from this shift: Total I/O In: 597.4 [I.V.:447.4; IV Piggyback:150] Out: 45 [Urine:45]  Physical Exam: General: Intubated, not responding Psych: Sedated Neuro: Sedated. No spontaneous movement. PERRL but constricted HEENT: ETT. Some blood around ETT. Neck: Supple without bruits or JVD. Lungs: Coarse BS anteriorly Heart: RRR no s3, s4, or murmurs. Abdomen: Soft, non-tender, non-distended, BS + x 4.  Extremities: No clubbing, cyanosis or edema. DP/PT/Radials 2+ and equal bilaterally. Cool  Lab Results:  Recent Labs  03/12/14 0701 03/13/14 0412  WBC 14.3* 17.6*  HGB 17.5* 15.7  PLT 209 173    Recent Labs  03/13/14 0507 03/13/14 1000  NA 131* 132*  K 3.8 4.3  CL 100 100  CO2 18* 20  GLUCOSE 251* 184*  BUN 22 24*  CREATININE 1.38* 1.41*    Recent Labs  03/12/14 0401 03/12/14 1001  TROPONINI 0.44* 0.58*   Hepatic Function Panel  Recent Labs  03/13/14 0507  PROT 6.4  ALBUMIN 3.4*  AST 47*  ALT 66*  ALKPHOS 76  BILITOT 0.9   No results for input(s): CHOL in the last 72 hours. No results for input(s): PROTIME in the last 72 hours.  Imaging: Imaging results have been reviewed  Cardiac Studies: 03/13/2014  Telemetry today mostly with appears to be accelerated junctional rhythm and underlying sinus rhythm with AV dissociation.  I only see one short Salvo of 5 beats of VT early this morning. Otherwise no prolonged VT episodes.  Echocardiogram 1/6 : Severe reduced EF of 15-20% with diffuse hypokinesis. Moderately dilated right ventricle with severely reduced function. Mildly dilated right atrium. Otherwise normal valves.  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 However echocardiogram either showed severe cardiomyopathy. What is unclear as the timeline of reduced ejection fraction versus VT. Question is the VT resulting from [facility] myopathy or the cardia myopathy related to VT. He'll be hard to imagine that his EF of 15% and no prodrome of symptoms suggesting heart failure with PND orthopnea and edema as well as fatigue. He has had dyspnea but this was thought to be related to his pulmonary sarcoid.  For now we'll continue amiodarone at current rate of infusion - anticipate reducing back to 30 mg/h later on today.  Will start to wean lidocaine infusion, current dose was cut in half with plan to cut it in half again milligrams at noon if agreeable by the EP  Shock: At least if not completely related to cardiogenic shock. With severely reduced ejection fraction cardiac Was likely reduced dramatically. Also cannot exclude the effect of hypothermia patient.  Continue the Levophed for blood pressure support - hopefully as rewarming occurs, we can gradually see improvement in overall pressures.    Check CoOx to get an assessment of cardiac output. - Unfortunately I'll be reluctant to consider inotropic support beyond the Levophed for fear of exacerbating his arrhythmias.  Nonischemic cardiomyopathy: Again unsure of the etiology. Could very well be that this was in the setting of VT storm and cardiac arrest/shock. Would need to reassess pending neurologic recovery. This can be assessed with cardiac MRI which we done to evaluate for sarcoid.  At present, with  persistent shock, no plans to initiate standard heart failure medications or diuretics.  Per [facility] M: On high-dose steroids and attempt to potentially calm a potential sarcoid flare. We do need to get the records from [facility]  The patient remains critically ill with complex decision making.  I spent 15 minutes discussing the patient's prognosis and plans for care with the wife.  Total time with patient, wife and chart 45-60 minutes   LOS: 2 days    HARDING, DAVID W 03/13/2014, 12:11 PM

## 2014-03-13 NOTE — Progress Notes (Signed)
PULMONARY / CRITICAL CARE MEDICINE   Name: Christopher Patrick MRN: [id number] DOB: 08-05-1965    ADMISSION DATE:  03/11/2014 CONSULTATION DATE:  03/11/14   REFERRING MD :  ED Dr. Preston Fleeting  CHIEF COMPLAINT:  Cardiac arrest   INITIAL PRESENTATION: 48 PMH sarcoid, GERD presenting s/p cardiac arrest found at home by wife initially alert then progressed to AMS. Prior to this he had hand paresthesias and palpitations a day ago.  EMS arrived VT, VF arrest w/o pulse CPR initiated ~10:30 PM with shocked x 3, given Epi x 2 transported to [facility]. CPR x 15-20 min and amio150 bolus and started on Lidocaine drip. He was intubated in the ED and cooled in the unit.    STUDIES:  1/6 CT head>>Mild loss of the expected gray-white matter differentiation, this could be artifact or reflect very early global brain edema/ anoxia. Recommend followup.  1/6 CXR Endotracheal tube appears to be in good position. Enteric tube is somewhat shallow with proximal side hole probably in the distal esophageal region. Cardiac enlargement. Infiltration or atelectasis in the left mid lung.  1/6 CXR Left central venous catheter with tip over the cavoatrial junction. No pneumothorax. Enteric tube tip remains at the level of the EG junction. Cardiac enlargement. Bilateral perihilar infiltrates  1/6 Ab portable Enteric tube tip projects over the EG junction with proximal side hole probably in the lower esophagus.  1/6 echo EF 15-20%, diffuse HK, RV mod dilated, RA mild dilated  1/7 CXR improving b/l infiltrates progress right lower lobe/segmental collapse   SIGNIFICANT EVENTS: 1/6>>VF arrest  1/6>>VT 8:33 shocked x 1, VT 8:35 shocked again and again at 10:13 w/ Amio bolus x 2, VT shock again at 11:43 with another amio bolus    HISTORY OF PRESENT ILLNESS:   48 PMH sarcoid, GERD presenting s/p cardiac arrest found at home by wife initially alert then progressed to AMS. Prior to this he had hand paresthesias and palpitations a day  ago.  EMS arrived VT, VF arrest w/o pulse CPR initiated ~10:30 PM with shocked x 3, given Epi x 2 transported to [facility]. CPR x 15-20 min and amio150 bolus and started on Lidocaine drip. He was intubated in the ED and cooled in the unit with multiple episodes of VT.   PAST MEDICAL HISTORY :   has a past medical history of Sarcoid and GERD (gastroesophageal reflux disease).  has past surgical history that includes left heart catheterization with coronary angiogram (N/A, 03/12/2014).  Medications Prior to Admission  Medication Sig Dispense Refill  . pantoprazole (PROTONIX) 20 MG tablet Take 20 mg by mouth 2 (two) times daily.     No Known Allergies  FAMILY HISTORY:  has no family status information on file.  SOCIAL HISTORY:  reports that he does not drink alcohol.  REVIEW OF SYSTEMS:   Unable to obtain due to mental status   SUBJECTIVE:  Unable to obtain due to mental status   VITAL SIGNS: Temp:  [90 F (32.2 C)-94.5 F (34.7 C)] 94.5 F (34.7 C) (01/07 0900) Pulse Rate:  [51-108] 70 (01/07 0900) Resp:  [16-20] 17 (01/07 0900) BP: (67-141)/(21-101) 109/82 mmHg (01/07 0900) SpO2:  [93 %-100 %] 96 % (01/07 0900) Arterial Line BP: (55-137)/(45-91) 100/70 mmHg (01/07 0900) FiO2 (%):  [60 %] 60 % (01/07 0800) Weight:  [218 lb 0.6 oz (98.9 kg)] 218 lb 0.6 oz (98.9 kg) (01/07 0400) HEMODYNAMICS: CVP:  [3 mmHg-13 mmHg] 13 mmHg VENTILATOR SETTINGS: Vent Mode:  [-] PRVC  FiO2 (%):  [60 %] 60 % Set Rate:  [16 bmp] 16 bmp Vt Set:  [600 mL] 600 mL PEEP:  [5 cmH20] 5 cmH20 Plateau Pressure:  [17 cmH20-20 cmH20] 20 cmH20 INTAKE / OUTPUT:  Intake/Output Summary (Last 24 hours) at 03/13/14 1015 Last data filed at 03/13/14 0900  Gross per 24 hour  Intake 4444.6 ml  Output    505 ml  Net 3939.6 ml    PHYSICAL EXAMINATION: General:  Resting in bed Neuro:  Sedated  HEENT:  Eyes closed, ETT intact Cardiovascular:  Reg rate, rhythm with paired PVCS  Lungs:  ctab Abdomen:  Soft, nl  bs, ntnd Musculoskeletal:  Nl muscle tone Skin:  Intact   LABS:  CBC  Recent Labs Lab 03/11/14 2337 03/12/14 03/12/14 0701 03/13/14 0412  WBC 13.0*  --  14.3* 17.6*  HGB 14.4 15.0 17.5* 15.7  HCT 43.2 44.0 49.5 44.6  PLT 189  --  209 173   Coag's  Recent Labs Lab 03/12/14 0300 03/12/14 1100  APTT 34 41*  INR 1.17 1.25   BMET  Recent Labs Lab 03/12/14 2154 03/13/14 0203 03/13/14 0507  NA 132* 131* 131*  K 3.5 3.8 3.8  CL 101 100 100  CO2 17* 17* 18*  BUN 18 21 22   CREATININE 1.26 1.33 1.38*  GLUCOSE 241* 255* 251*   Electrolytes  Recent Labs Lab 03/11/14 2337  03/12/14 2154 03/13/14 0203 03/13/14 0507  CALCIUM 8.8  < > 8.2* 8.4 8.5  MG 2.0  --   --   --  1.7  < > = values in this interval not displayed. Sepsis Markers  Recent Labs Lab 03/11/14 2354 03/12/14 0415 03/13/14 0413  LATICACIDVEN 4.33* 2.6* 6.5*   ABG  Recent Labs Lab 03/12/14 0339 03/12/14 0540 03/13/14 0423  PHART 7.343* 7.449 7.216*  PCO2ART 35.3 31.5* 43.8  PO2ART 63.0* 222.0* 101.0*   Liver Enzymes  Recent Labs Lab 03/13/14 0507  AST 47*  ALT 66*  ALKPHOS 76  BILITOT 0.9  ALBUMIN 3.4*   Cardiac Enzymes  Recent Labs Lab 03/11/14 2337 03/12/14 0401 03/12/14 1001  TROPONINI 0.08* 0.44* 0.58*   Glucose  Recent Labs Lab 03/12/14 1813 03/12/14 1946 03/12/14 2158 03/12/14 2341 03/13/14 0341 03/13/14 0801  GLUCAP 203* 191* 219* 235* 220* 221*    Imaging Dg Chest Port 1 View  03/12/2014   CLINICAL DATA:  Central line placement.  EXAM: PORTABLE CHEST - 1 VIEW  COMPARISON:  03/11/2014  FINDINGS: Endotracheal tube tip measures 5.3 cm above the carinal. Enteric tube tip projects over the EG junction. Interval placement of a left central venous catheter with tip over the cavoatrial junction. No pneumothorax. Persistent cardiac enlargement and perihilar infiltrates.  IMPRESSION: Left central venous catheter with tip over the cavoatrial junction. No pneumothorax.  Enteric tube tip remains at the level of the EG junction. Cardiac enlargement. Bilateral perihilar infiltrates.   Electronically Signed   By: Burman Nieves M.D.   On: 03/12/2014 03:23   Dg Chest Port 1 View  03/12/2014   CLINICAL DATA:  Post CPR to assess endotracheal tube.  EXAM: PORTABLE CHEST - 1 VIEW  COMPARISON:  None.  FINDINGS: Two films were obtained, initial image demonstrating endotracheal tube tip 9 cm above the carina and second image demonstrating endotracheal tube tip 3.9 cm above the carina. Enteric tube tip is at the level of the EG junction with proximal side hole probably in the lower esophagus. Shallow inspiration. Cardiac enlargement with normal pulmonary  vascularity. Focal opacity in the left mid lung consistent with infiltration or atelectasis. No blunting of costophrenic angles. No pneumothorax. Nodule in the right mid lung probably represents a calcified granuloma.  IMPRESSION: Endotracheal tube appears to be in good position. Enteric tube is somewhat shallow with proximal side hole probably in the distal esophageal region. Cardiac enlargement. Infiltration or atelectasis in the left mid lung.   Electronically Signed   By: Burman Nieves M.D.   On: 03/12/2014 00:13   Dg Abd Portable 1v  03/12/2014   CLINICAL DATA:  OG placement.  EXAM: PORTABLE ABDOMEN - 1 VIEW  COMPARISON:  None.  FINDINGS: Enteric tube tip projects over the EG junction region with proximal side hole projecting over the lower esophagus. Cardiac enlargement. Infiltrates noted in the lower lungs.  IMPRESSION: Enteric tube tip projects over the EG junction with proximal side hole probably in the lower esophagus.   Electronically Signed   By: Burman Nieves M.D.   On: 03/12/2014 03:25   Ct Portable Head W/o Cm  03/12/2014   CLINICAL DATA:  Status post CPR, cardiac arrest. Acute encephalopathy.  EXAM: CT HEAD WITHOUT CONTRAST  TECHNIQUE: Contiguous axial images were obtained from the base of the skull through the  vertex without intravenous contrast.  COMPARISON:  None.  FINDINGS: The ventricles and sulci are normal. Somewhat poor gray-white matter differentiation though, there is streak artifact from life support lines. No intraparenchymal hemorrhage, mass effect nor midline shift. No acute large vascular territory infarcts.  No abnormal extra-axial fluid collections. Basal cisterns are patent.  No skull fracture. The included ocular globes and orbital contents are non-suspicious. Bilateral maxillary mucosal retention cyst, moderate paranasal sinusitis. Mastoid air cells are well aerated.  IMPRESSION: Mild loss of the expected gray-white matter differentiation, this could be artifact or reflect very early global brain edema/ anoxia. Recommend followup.   Electronically Signed   By: Awilda Metro   On: 03/12/2014 06:00     ASSESSMENT / PLAN:  PULMONARY OETT 1/6>> A: Acute respiratory failure  VDRF 1/7 CXR with improving b/l infiltrates progression of right lower lobe/segmental collapse  Possible h/o sarcoid   P:   Continue full vent support. Will increase set rate to 21  ABG, CXR in am Solumedrol 80 q12   CARDIOVASCULAR CVL Left IJ 1/6, L rad A line PIV x 2 A:  S/p cardiac arrest Recurrent multifocal/monophasic VT Elevated troponin  1st degree heart block, RBBB Prolonged QTc 161>096>045 ? 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 Pt will be normothermic at 6 PM today Cardiology following and EP (Dr. Ladona Ridgel) following who is considering ICD and cardiac MRI if pt recovers/awakens Lido gtt at 0.5, Amio gtt @ 60 On Levo at 40  Given Mag 1 g x 1 and K 10 x 4 this am (only gave 10 x 1 and held so far further runs K is 4.3 now and with rewarming K may increase; repeat BMET at 2 pm), follow electrolytes   RENAL A:   Non AG Metabolic acidosis Metabolic Alkalosis  Respiratory Acidosis  Hypokalemia  P:   Strict i/o daily  weights  Replaced MAg 1 g, K 10 x 1 only as pt is rewarming and may be prone to hyperK. Repeat BMET at 2 PM   GASTROINTESTINAL A:   GI px  NPO with NGT intact   P:   Pepcid 20 bid  Re eval for feeds tomorrow  HEMATOLOGIC A:  DVT px   P:  Scds, heparin sq   INFECTIOUS A:   Leukocytosis trending up ETT with mod tan thick secretions  P:   BCx2  Not obtained UC not obtained Sputum pending MRSA neg  Abx: none  ENDOCRINE A:   Hyperglycemia   P:   HA1C pending SSI q4   NEUROLOGIC A:   Sedated  Encephalopathy   P:   Hold EEG for now wait until pt rewarms  RASS goal: currently at -5  Fentanyl , Versed , Nimbex     FAMILY  - Updates: wife at bedside by updated by cardiology today - Inter-disciplinary family meet or Palliative Care meeting due by:  03/19/14     TODAY'S SUMMARY: multiform/monomorphic VT/cardiac arrest with NI 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 Cardiology and EP following. Continue full vent support. Prognosis guarded   The patient is critically ill with multiple organ systems failure and requires high complexity decision making for assessment and support, frequent evaluation and titration of therapies, application of advanced monitoring technologies and extensive interpretation of multiple databases.    03/13/2014, 10:15 AM Desma Maxim MD PGY 3 IM  PCCM ATTENDING: I have reviewed pt's initial presentation, consultants notes and hospital database in detail.  The above assessment and plan was formulated under my direction.   Billy Fischer, MD;  PCCM service; [place] [phone]

## 2014-03-13 NOTE — Progress Notes (Addendum)
eLink Physician-Brief Progress Note Patient Name: Christopher Patrick DOB: Jun 10, 1965 MRN: [id number]   Date of Service  03/13/2014  HPI/Events of Note  Patient started rewarming started to have RUE and RLE twitching, possible seizure  eICU Interventions  Inc Versed gtt to 4mg  keppra load and maintenance Ordered EEG Consult neuro     Intervention Category Major Interventions: Seizures - evaluation and management  Ying Blankenhorn 03/13/2014, 3:28 PM

## 2014-03-13 NOTE — Progress Notes (Addendum)
Patient Profile: 49 y/o male with history of sarcoid but no known past cardiac history admitted 03/11/14 after sustaining an out of hospital cardiac arrest secondary to VT/VF. S/p resuscitation w/ CPR, epi and amio. Intubated and placed on Artic Sun protocol. Once admitted he had VT storm requiring additional shocks. Now on Amio drip and lidocaine. 2D echo 1/6 demonstrated severely redcued EF of 15-20%. LHC 1/6 demonstrated normal coronaries suggesting nonischemic cardiomyopathy.     Subjective: Remains intubated and sedated. Per RN, he had a 4 beat run of VT at midnight. No further recurrence.   Objective: Vital signs in last 24 hours: Temp:  [90 F (32.2 C)-94.5 F (34.7 C)] 94.5 F (34.7 C) (01/07 0900) Pulse Rate:  [51-108] 70 (01/07 0900) Resp:  [16-20] 17 (01/07 0900) BP: (67-141)/(21-101) 109/82 mmHg (01/07 0900) SpO2:  [93 %-100 %] 96 % (01/07 0900) Arterial Line BP: (55-137)/(45-91) 100/70 mmHg (01/07 0900) FiO2 (%):  [60 %] 60 % (01/07 0800) Weight:  [218 lb 0.6 oz (98.9 kg)] 218 lb 0.6 oz (98.9 kg) (01/07 0400)    Intake/Output from previous day: 01/06 0701 - 01/07 0700 In: 4546.8 [I.V.:4276.8; NG/GT:70; IV Piggyback:200] Out: 595 [Urine:445; Emesis/NG output:150] Intake/Output this shift: Total I/O In: 331.4 [I.V.:331.4] Out: 20 [Urine:20]  Medications Current Facility-Administered Medications  Medication Dose Route Frequency Provider Last Rate Last Dose  . 0.9 %  sodium chloride infusion   Intravenous Continuous Merwyn Katos, MD   Stopped at 03/12/14 1701  . 0.9 %  sodium chloride infusion   Intravenous Continuous Merwyn Katos, MD 10 mL/hr at 03/13/14 0900    . amiodarone (NEXTERONE PREMIX) 360 MG/200ML (1.8 mg/mL) IV infusion  60 mg/hr Intravenous Continuous Marinus Maw, MD 33.3 mL/hr at 03/13/14 0900 60 mg/hr at 03/13/14 0900  . antiseptic oral rinse (CPC / CETYLPYRIDINIUM CHLORIDE 0.05%) solution 7 mL  7 mL Mouth Rinse QID Merwyn Katos, MD   7 mL at  03/13/14 0400  . artificial tears (LACRILUBE) ophthalmic ointment 1 application  1 application Both Eyes 3 times per day Dow Adolph, MD   1 application at 03/13/14 [phone]  . chlorhexidine (PERIDEX) 0.12 % solution 15 mL  15 mL Mouth Rinse BID Merwyn Katos, MD   15 mL at 03/13/14 0802  . cisatracurium (NIMBEX) 200 mg in sodium chloride 0.9 % 200 mL (1 mg/mL) infusion  1-1.5 mcg/kg/min Intravenous Continuous Dow Adolph, MD 6 mL/hr at 03/13/14 0900 1.002 mcg/kg/min at 03/13/14 0900   And  . cisatracurium (NIMBEX) bolus via infusion 5 mg  0.05 mg/kg Intravenous PRN Dow Adolph, MD      . famotidine (PEPCID) IVPB 20 mg  20 mg Intravenous Q12H Merwyn Katos, MD   20 mg at 03/13/14 1010  . fentaNYL (SUBLIMAZE) 2,500 mcg in sodium chloride 0.9 % 250 mL (10 mcg/mL) infusion  25-400 mcg/hr Intravenous Continuous Duayne Cal, NP 5 mL/hr at 03/13/14 0900 50 mcg/hr at 03/13/14 0900  . fentaNYL (SUBLIMAZE) bolus via infusion 50 mcg  50 mcg Intravenous Q1H PRN Duayne Cal, NP      . heparin injection 5,000 Units  5,000 Units Subcutaneous 3 times per day Annett Gula, MD   5,000 Units at 03/13/14 [phone]  . insulin aspart (novoLOG) injection 0-15 Units  0-15 Units Subcutaneous 6 times per day Merwyn Katos, MD   5 Units at 03/13/14 0847  . lidocaine (cardiac) IV  infusion 4 mg/mL  0.5 mg/min Intravenous Titrated Piedad Climes  Herbie Baltimore, MD 7.5 mL/hr at 03/13/14 0900 0.5 mg/min at 03/13/14 0900  . magnesium sulfate IVPB 1 g 100 mL  1 g Intravenous Once Annett Gula, MD      . methylPREDNISolone sodium succinate (SOLU-MEDROL) 125 mg/2 mL injection 80 mg  80 mg Intravenous Q12H Merwyn Katos, MD   80 mg at 03/13/14 0002  . midazolam (VERSED) 50 mg in sodium chloride 0.9 % 50 mL (1 mg/mL) infusion  0-10 mg/hr Intravenous Continuous Duayne Cal, NP 2 mL/hr at 03/13/14 0900 2 mg/hr at 03/13/14 0900  . midazolam (VERSED) bolus via infusion 1-2 mg  1-2 mg Intravenous Q2H PRN Duayne Cal, NP      .  norepinephrine (LEVOPHED) 16 mg in dextrose 5 % 250 mL (0.064 mg/mL) infusion  0-50 mcg/min Intravenous Titrated Merwyn Katos, MD 37.5 mL/hr at 03/13/14 0900 40 mcg/min at 03/13/14 0900  . potassium chloride 10 mEq in 100 mL IVPB  10 mEq Intravenous Q1 Hr x 4 Merwyn Katos, MD        PE: General appearance: intubated and sedated Lungs: clear to auscultation bilaterally Heart: regular rate and rhythm Extremities: no LEE Pulses: 2+ and symmetric Skin: warm and dry Neurologic: sedated  Lab Results:   Recent Labs  03/11/14 2337 03/12/14 03/12/14 0701 03/13/14 0412  WBC 13.0*  --  14.3* 17.6*  HGB 14.4 15.0 17.5* 15.7  HCT 43.2 44.0 49.5 44.6  PLT 189  --  209 173   BMET  Recent Labs  03/12/14 2154 03/13/14 0203 03/13/14 0507  NA 132* 131* 131*  K 3.5 3.8 3.8  CL 101 100 100  CO2 17* 17* 18*  GLUCOSE 241* 255* 251*  BUN CREATININE 1.26 1.33 1.38*  CALCIUM 8.2* 8.4 8.5   PT/INR  Recent Labs  03/12/14 0300 03/12/14 1100  LABPROT 15.1 15.8*  INR 1.17 1.25    Studies/Results:  2D echo 03/12/14 Study Conclusions  - Left ventricle: The cavity size was normal. Wall thickness was normal. Systolic function was severely reduced. The estimated ejection fraction was in the range of 15% to 20%. Diffuse hypokinesis. - Right ventricle: The cavity size was moderately dilated. Systolic function was severely reduced. - Right atrium: The atrium was mildly dilated.    LHC 03/12/14 Hemodynamics: AO: 97/76 mmHg LV: 106/15 mmHg LVEDP: 18 mmHg  Coronary angiography: Coronary dominance: right    Left Main: Normal  Left Anterior Descending (LAD): Normal in size with no significant disease.  1st diagonal (D1): Normal in size with no significant disease.  2nd diagonal (D2): Medium in size with no significant disease.  3rd diagonal (D3): Medium in size with no significant disease.  Circumflex (LCx): Normal in size and  nondominant. There is the vessel has no significant disease.  1st obtuse marginal: Large in size with no significant disease.  2nd obtuse marginal: Normal in size with no significant disease.  3rd obtuse marginal: Medium in size with no significant disease.  Right Coronary Artery: Large in size and dominant. The vessel has no significant disease.  Posterior descending artery: Normal in size with no significant disease.  Posterior AV segment: Normal  Posterolateral branchs: Normal  Left ventriculography: Was not performed. Ejection fraction was 15-20% by echo.   Final Conclusions:  1. Normal coronary arteries. 2. Mildly elevated left ventricular end-diastolic pressure.  3. 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 VT Storm: Patient had a 4 beat run of VT at midnight. No further recurrences. In SR. He remains on IV lidocaine and IV amiodarone. He has been on IV lidocaine since admission. Will need to consider discontinuing lidocaine and reducing dose of amiodarone. Dr. Johney Frame to assess and will make final determination. K is stable. Mg pending. Supplement as needed. Recommend keeping K ~4 and Mg >1.9. LHC was negative for CAD. Cardiomyopathy is nonischemic. With history of sarcoid, there is suspicion for cardiac involvement. Once/if he awakens, cardiac MR will be planned. BiV ICD insertion will be recommended. Per Dr. Ladona Ridgel, with multiple VT morphologies, ablation would not be effective at this time. EP will continue to follow along.   MD to follow with further recommendations.     LOS: 2 days    Brittainy M. Sharol Harness, PA-C 03/13/2014 10:19 AM   I have seen, examined the  patient, and reviewed the above assessment and plan.  Changes to above are made where necessary.  The patient has begun to wake up.  This is very encouraging.  VT is also improved.   Will decrease amiodarone to 30 mg/hour and stop lidocaine drip if no further VT in the next 2 hours.  Upon discussion with the patient's wife, the patients father and three paternal uncles all died suddenly.  Though the patients father was said to have died of stroke at autopsy, the others were not explained and included a 74 month old, 51 year old, and 51 year old, all dying of sudden death.  I think it is therefore important that we better determine the cause of his cardiomyopathy and arrhythmias. Once awake and more stable, would anticipate cardiac MRI to evaluate for sarcoid substrate vs another cause.  Repeat echo once more stable (2-3 days) would also be reasonable to see if his myocardium has improved with resolution of acute shock event related to his cardiac arrest.  Ultimately, would anticipate BiV ICD prior to discharge if he makes meaninful recovery.  I agree with Dr Ladona Ridgel that this is current no role for ablation.  EP to follow.  Co Sign: Hillis Range, MD 03/13/2014 5:08 PM

## 2014-03-14 ENCOUNTER — Inpatient Hospital Stay (HOSPITAL_COMMUNITY): Payer: 59

## 2014-03-14 DIAGNOSIS — G9340 Encephalopathy, unspecified: Secondary | ICD-10-CM

## 2014-03-14 LAB — CARBOXYHEMOGLOBIN
Carboxyhemoglobin: 1.1 % (ref 0.5–1.5)
Methemoglobin: 1 % (ref 0.0–1.5)
O2 Saturation: 72.7 %
Total hemoglobin: 14.7 g/dL (ref 13.5–18.0)

## 2014-03-14 LAB — CBC WITH DIFFERENTIAL/PLATELET
Basophils Absolute: 0 10*3/uL (ref 0.0–0.1)
Basophils Relative: 0 % (ref 0–1)
Eosinophils Absolute: 0 10*3/uL (ref 0.0–0.7)
Eosinophils Relative: 0 % (ref 0–5)
HCT: 42.5 % (ref 39.0–52.0)
HEMOGLOBIN: 15.1 g/dL (ref 13.0–17.0)
Lymphocytes Relative: 2 % — ABNORMAL LOW (ref 12–46)
Lymphs Abs: 0.4 10*3/uL — ABNORMAL LOW (ref 0.7–4.0)
MCH: 30.4 pg (ref 26.0–34.0)
MCHC: 35.5 g/dL (ref 30.0–36.0)
MCV: 85.5 fL (ref 78.0–100.0)
MONOS PCT: 4 % (ref 3–12)
Monocytes Absolute: 0.9 10*3/uL (ref 0.1–1.0)
Neutro Abs: 21.1 10*3/uL — ABNORMAL HIGH (ref 1.7–7.7)
Neutrophils Relative %: 94 % — ABNORMAL HIGH (ref 43–77)
PLATELETS: 169 10*3/uL (ref 150–400)
RBC: 4.97 MIL/uL (ref 4.22–5.81)
RDW: 13.6 % (ref 11.5–15.5)
WBC: 22.4 10*3/uL — ABNORMAL HIGH (ref 4.0–10.5)

## 2014-03-14 LAB — BLOOD GAS, ARTERIAL
ACID-BASE DEFICIT: 2.6 mmol/L — AB (ref 0.0–2.0)
Bicarbonate: 21 mEq/L (ref 20.0–24.0)
Drawn by: 39898
FIO2: 0.5 %
MECHVT: 600 mL
O2 SAT: 99.2 %
PCO2 ART: 32.4 mmHg — AB (ref 35.0–45.0)
PEEP: 5 cmH2O
PH ART: 7.427 (ref 7.350–7.450)
Patient temperature: 98.6
RATE: 14 resp/min
TCO2: 22 mmol/L (ref 0–100)
pO2, Arterial: 170 mmHg — ABNORMAL HIGH (ref 80.0–100.0)

## 2014-03-14 LAB — HEPATIC FUNCTION PANEL
ALK PHOS: 54 U/L (ref 39–117)
ALT: 41 U/L (ref 0–53)
AST: 30 U/L (ref 0–37)
Albumin: 2.9 g/dL — ABNORMAL LOW (ref 3.5–5.2)
BILIRUBIN TOTAL: 1.3 mg/dL — AB (ref 0.3–1.2)
Bilirubin, Direct: 0.3 mg/dL (ref 0.0–0.3)
Indirect Bilirubin: 1 mg/dL — ABNORMAL HIGH (ref 0.3–0.9)
Total Protein: 5.3 g/dL — ABNORMAL LOW (ref 6.0–8.3)

## 2014-03-14 LAB — BASIC METABOLIC PANEL
Anion gap: 11 (ref 5–15)
BUN: 35 mg/dL — ABNORMAL HIGH (ref 6–23)
CO2: 22 mmol/L (ref 19–32)
CREATININE: 1.74 mg/dL — AB (ref 0.50–1.35)
Calcium: 8.5 mg/dL (ref 8.4–10.5)
Chloride: 97 mEq/L (ref 96–112)
GFR calc non Af Amer: 44 mL/min — ABNORMAL LOW (ref >90)
GFR, EST AFRICAN AMERICAN: 51 mL/min — AB (ref >90)
GLUCOSE: 143 mg/dL — AB (ref 70–99)
Potassium: 4.8 mmol/L (ref 3.5–5.1)
Sodium: 130 mmol/L — ABNORMAL LOW (ref 135–145)

## 2014-03-14 LAB — GLUCOSE, CAPILLARY
GLUCOSE-CAPILLARY: 119 mg/dL — AB (ref 70–99)
Glucose-Capillary: 135 mg/dL — ABNORMAL HIGH (ref 70–99)
Glucose-Capillary: 127 mg/dL — ABNORMAL HIGH (ref 70–99)
Glucose-Capillary: 108 mg/dL — ABNORMAL HIGH (ref 70–99)

## 2014-03-14 LAB — OSMOLALITY, URINE: OSMOLALITY UR: 712 mosm/kg (ref 390–1090)

## 2014-03-14 LAB — SODIUM, URINE, RANDOM

## 2014-03-14 LAB — CREATININE, URINE, RANDOM: CREATININE, URINE: 151.76 mg/dL

## 2014-03-14 LAB — OSMOLALITY: Osmolality: 286 mOsm/kg (ref 275–300)

## 2014-03-14 LAB — MAGNESIUM: MAGNESIUM: 1.7 mg/dL (ref 1.5–2.5)

## 2014-03-14 MED ORDER — ONDANSETRON HCL 4 MG/2ML IJ SOLN
INTRAMUSCULAR | Status: AC
  Filled 2014-03-14: qty 2

## 2014-03-14 MED ORDER — FENTANYL CITRATE 0.05 MG/ML IJ SOLN
12.5000 ug | INTRAMUSCULAR | Status: DC | PRN
  Administered 2014-03-14: 12.5 ug via INTRAVENOUS
  Administered 2014-03-15: 25 ug via INTRAVENOUS
  Filled 2014-03-14 (×3): qty 2

## 2014-03-14 MED ORDER — ASPIRIN 325 MG PO TABS: 325.0000 mg | ORAL_TABLET | Freq: Every day | ORAL | Status: DC

## 2014-03-14 MED ORDER — ONDANSETRON HCL 4 MG/2ML IJ SOLN
4.0000 mg | Freq: Four times a day (QID) | INTRAMUSCULAR | Status: DC | PRN
  Administered 2014-03-14: 4 mg via INTRAVENOUS

## 2014-03-14 MED ORDER — METHYLPREDNISOLONE SODIUM SUCC 40 MG IJ SOLR
40.0000 mg | Freq: Two times a day (BID) | INTRAMUSCULAR | Status: DC
  Administered 2014-03-14 – 2014-03-15 (×3): 40 mg via INTRAVENOUS
  Filled 2014-03-14 (×4): qty 1

## 2014-03-14 NOTE — Progress Notes (Signed)
[place] notified of heart block changes

## 2014-03-14 NOTE — Progress Notes (Signed)
03/14/2014- 1000- Pt suctioned and extubated as per MD order.  RN at bedside for procedure.  HR 68, rr 16, sats 97% on 4lpm cannula.  Pt vocalizing post extubation.  Md to bedside with family post procedure.  Pt tolerated well.  Will wean oxygen as tolerated.  Ambu-bag and ventilator on stand-by at bedside.  s Loel Betancur rrt, rcp

## 2014-03-14 NOTE — Progress Notes (Addendum)
The patient is a 49 y/o male with no prior cardiac history. His PMH is significant for sarcoid with known pulmonary involement and GERD. He presented to [facility] 03/11/14 after sustaining a witnessed out of hospital cardiac arrest at home. Upon EMS arrival, he became pulseless. Underlying rhythm was VT. CPR was initiated for a duration of 20 minutes. He was defibrillated x 3 and received 2 rounds of epi, a bolus of 150 mg of Amiodarone and started on a lidocaine drip. He was intubated in the ED and placed on ARTIC SUN Protocol. He was transferred to the CCU. Since admission, he has had recurrent VT in the unit requiring additional shocks x 2. He has received potassium supplementation. K this am was 3.2. Repeat K after supplementation was 3.8. Mg is stable at 2.0. He continues on IV Amiodarone and lidocaine. He is also on supplemental Levothroid  Subjective:  Rewarmed yesterday. Widely awake today, responds to commands. On minimal vent settings. Significant weaning of pressors  Objective:  Vital Signs in the last 24 hours: Temp:  [94.5 F (34.7 C)-99.1 F (37.3 C)] 98.4 F (36.9 C) (01/08 0800) Pulse Rate:  [46-141] 66 (01/08 0800) Resp:  [14-30] 16 (01/08 0800) BP: (83-153)/(57-93) 93/62 mmHg (01/08 0800) SpO2:  [85 %-100 %] 100 % (01/08 0800) Arterial Line BP: (59-117)/(54-83) 59/54 mmHg (01/07 2130) FiO2 (%):  [50 %-60 %] 50 % (01/08 0400) Weight:  [216 lb 4.7 oz (98.11 kg)] 216 lb 4.7 oz (98.11 kg) (01/08 0330)  Intake/Output from previous day: 01/07 0701 - 01/08 0700 In: 1890.7 [I.V.:1360.7; NG/GT:30; IV Piggyback:500] Out: 590 [Urine:590] Intake/Output from this shift: Total I/O In: 17 [I.V.:17] Out: -   Physical Exam: General: Intubated, positive commands Psych: Sedated Neuro: Awake and alert. PERRLA HEENT: ETT.  Otherwise [place]/AT Neck: Supple without bruits or JVD. Lungs: Coarse BS anteriorly Heart: RRR no s3, s4, or murmurs. Abdomen: Soft, non-tender, non-distended,  BS + x 4.  Extremities: No clubbing, cyanosis or edema. DP/PT/Radials 2+ and equal bilaterally. Cool  Lab Results:  Recent Labs  03/13/14 0412 03/14/14 0500  WBC 17.6* 22.4*  HGB 15.7 15.1  PLT 173 169    Recent Labs  03/13/14 1500 03/14/14 0500  NA 129* 130*  K 4.7 4.8  CL 100 97  CO2 20 22  GLUCOSE 122* 143*  BUN 27* 35*  CREATININE 1.58* 1.74*    Recent Labs  03/12/14 0401 03/12/14 1001  TROPONINI 0.44* 0.58*   Hepatic Function Panel  Recent Labs  03/14/14 0500  PROT 5.3*  ALBUMIN 2.9*  AST 30  ALT 41  ALKPHOS 54  BILITOT 1.3*  BILIDIR 0.3  IBILI 1.0*   No results for input(s): CHOL in the last 72 hours. No results for input(s): PROTIME in the last 72 hours.  Imaging: Imaging results have been reviewed  Cardiac Studies: 03/14/2014  Telemetry: Mostly SR - no further VT  Echocardiogram 1/6 : Severe reduced EF of 15-20% with diffuse hypokinesis. Moderately dilated right ventricle with severely reduced function. Mildly dilated right atrium. Otherwise normal valves.  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 However echocardiogram either showed severe cardiomyopathy.   Now on Low dose Amio infusion, Lidocaine off & no more VT since [place] AM  Would defer to electrophysiology as the timing of converting to by mouth.  May consider Recheck echocardiogram over the weekend but would do a cardiac MRI once off of [facility].  Anticipate likely due to fibrillar placement prior to discharge for  primary VT event.  Appreciate EP consultation service  Shock: At least if not completely related to cardiogenic shock. With severely reduced ejection fraction cardiac Was likely reduced dramatically. Also cannot exclude the effect of hypothermia patient.  Continue slow wean off of Levophed   Check CoOx to get an assessment of cardiac output. - Unfortunately I'll be reluctant to consider inotropic support beyond the Levophed for fear of exacerbating his arrhythmias.  Nonischemic cardiomyopathy: Again unsure of the etiology. Question if this is tachycardia mediated versus sarcoid mediated..  At present, with persistent shock, no plans to initiate standard heart failure medications or diuretics..  Would gradually start adding CHF/cardiomyopathy meds once off pressors.  As above will recheck echocardiogram as well as cardiac MRI over the weekend  Per [facility] M: On high-dose steroids and attempt to potentially calm a potential sarcoid flare. We do need to get the records from [facility]  The patient remains critically ill with complex decision making.  I spent 15 minutes discussing the patient's prognosis and plans for care with the wife.  Total time with patient, wife and chart 45 minutes   LOS: 3 days    Kenika Sahm W 03/14/2014, 8:04 AM

## 2014-03-14 NOTE — Progress Notes (Signed)
50 Fent / 10mg  versed wasted in sink, witnessed by 2 RNs

## 2014-03-14 NOTE — Progress Notes (Signed)
EKG CRITICAL VALUE     12 lead EKG performed.  Critical value noted. Norwood Levo, RN notified.   Abdias Hickam L, CCT 03/14/2014 4:02 PM

## 2014-03-14 NOTE — Progress Notes (Signed)
Subjective: No overnight events. Remains alert, following commands.   Objective: Current vital signs: BP 91/74 mmHg  Pulse 71  Temp(Src) 98.1 F (36.7 C) (Core (Comment))  Resp 15  Ht 6' (1.829 m)  Wt 98.11 kg (216 lb 4.7 oz)  BMI 29.33 kg/m2  SpO2 100% Vital signs in last 24 hours: Temp:  [94.5 F (34.7 C)-99.1 F (37.3 C)] 98.1 F (36.7 C) (01/08 0726) Pulse Rate:  [46-141] 71 (01/08 0726) Resp:  [14-30] 15 (01/08 0726) BP: (83-153)/(57-101) 91/74 mmHg (01/08 0726) SpO2:  [85 %-100 %] 100 % (01/08 0726) Arterial Line BP: (59-137)/(54-87) 59/54 mmHg (01/07 2130) FiO2 (%):  [50 %-60 %] 50 % (01/08 0400) Weight:  [98.11 kg (216 lb 4.7 oz)] 98.11 kg (216 lb 4.7 oz) (01/08 0330)  Intake/Output from previous day: 01/07 0701 - 01/08 0700 In: 1890.7 [I.V.:1360.7; NG/GT:30; IV Piggyback:500] Out: 590 [Urine:590] Intake/Output this shift:   Nutritional status:    Neurologic Exam: Mental Status: Intubated, will open eyes to voice. On command will move eyes to R or L, wiggle/squeeze right and left hands and wiggles R and L toes.  Cranial Nerves: II: pupils equal, round, reactive to light  III,IV, VI: eyes midline, will move to the right and left on command V,VII: appears grossly symmetric but difficult to fully test due to intubation VIII: hearing normal bilaterally Motor: Unable to formally test but appears to move all extremities symmetrically and to command Tone and bulk:normal tone throughout; no atrophy noted Sensory: withdrawals symmetrically to noxious stimuli Plantars: Right: downgoingLeft: downgoing   Lab Results: Basic Metabolic Panel:  Recent Labs Lab 03/11/14 2337  03/13/14  03/13/14 0507 03/13/14 1000 03/13/14 1400 03/13/14 1500 03/14/14 0500  NA 140  < >  --   < > 131* 132* 132* 129* 130*  K 4.1  < >  --   < > 3.8 4.3 4.3 4.7 4.8  CL 107  < >  --   < > 100 100 99 100 97  CO2 22  < >  --   < > 18* 20 24 20 22    GLUCOSE 173*  < >  --   < > 251* 184* 153* 122* 143*  BUN 15  < >  --   < > 22 24* 78* 27* 35*  CREATININE 1.50*  < >  --   < > 1.38* 1.41* 1.71* 1.58* 1.74*  CALCIUM 8.8  < >  --   < > 8.5 8.6 9.4 8.4 8.5  MG 2.0  --  1.7  --  1.7  --   --   --   --   < > = values in this interval not displayed.  Liver Function Tests:  Recent Labs Lab 03/13/14 0507 03/14/14 0500  AST 47* 30  ALT 66* 41  ALKPHOS 76 54  BILITOT 0.9 1.3*  PROT 6.4 5.3*  ALBUMIN 3.4* 2.9*   No results for input(s): LIPASE, AMYLASE in the last 168 hours. No results for input(s): AMMONIA in the last 168 hours.  CBC:  Recent Labs Lab 03/11/14 2337 03/12/14 03/12/14 0701 03/13/14 0412 03/14/14 0500  WBC 13.0*  --  14.3* 17.6* 22.4*  NEUTROABS 10.1*  --   --  16.6* 21.1*  HGB 14.4 15.0 17.5* 15.7 15.1  HCT 43.2 44.0 49.5 44.6 42.5  MCV 88.9  --  86.1 85.6 85.5  PLT 189  --  209 173 169    Cardiac Enzymes:  Recent Labs Lab 03/11/14 2337 03/12/14  0401 03/12/14 1001  TROPONINI 0.08* 0.44* 0.58*    Lipid Panel: No results for input(s): CHOL, TRIG, HDL, CHOLHDL, VLDL, LDLCALC in the last 168 hours.  CBG:  Recent Labs Lab 03/13/14 0801 03/13/14 1155 03/13/14 2019 03/13/14 2351 03/14/14 0509  GLUCAP 221* 144* 107* 120* 135*    Microbiology: Results for orders placed or performed during the hospital encounter of 03/11/14  MRSA PCR Screening     Status: None   Collection Time: 03/12/14  2:12 AM  Result Value Ref Range Status   MRSA by PCR NEGATIVE NEGATIVE Final    Comment:        The GeneXpert MRSA Assay (FDA approved for NASAL specimens only), is one component of a comprehensive MRSA colonization surveillance program. It is not intended to diagnose MRSA infection nor to guide or monitor treatment for MRSA infections.     Coagulation Studies:  Recent Labs  03/12/14 0300 03/12/14 1100  LABPROT 15.1 15.8*  INR 1.17 1.25    Imaging: Dg Chest 1 View  03/13/2014   CLINICAL  DATA:  Cardiac arrest.  Sarcoid.  EXAM: CHEST - 1 VIEW  COMPARISON:  03/12/2014  FINDINGS: Endotracheal tube in good position. Central venous catheter tip in the lower SVC. No pneumothorax. Gastric tube in the stomach.  Improvement in left upper lobe and left lower lobe airspace disease. Improvement in right perihilar infiltrate. Progression of right middle lobe collapse. No significant effusion.  IMPRESSION: Improving bilateral infiltrates. Progression of right middle lobe collapse.   Electronically Signed   By: Marlan Palau M.D.   On: 03/13/2014 07:27    Medications:  Continuous: . amiodarone 30 mg/hr (03/14/14 0000)  . fentaNYL infusion INTRAVENOUS 100 mcg/hr (03/13/14 2000)  . norepinephrine (LEVOPHED) Adult infusion 15 mcg/min (03/14/14 0200)    Assessment/Plan:  48y/o male with hx of sarcoidosis, with known pulm involvement but no cardiac history presenting with an out of hospital cardiac arrest. Hypothermia protocol was initiated upon arrival. Neurology consulted after GTC seizure activity noted upon rewarming. He subsequently woke up and was noted to be following commands  EEG informal read shows generalized low voltage slowing. No noted sharp wave or epileptiform activity.   Continues to be alert, following all commands.   -continue keppra  Q12 -PCCM and cardiology for continued management of underlying cardiac problem -no further neurological workup indicated at this time -will continue to follow as needed   LOS: 3 days   Elspeth Cho, DO [facility] [phone]  If 7pm- 7am, please page neurology on call as listed in AMION. 03/14/2014  7:28 AM

## 2014-03-14 NOTE — Progress Notes (Signed)
Patient ID: VESTER KROUT, male   DOB: 08-07-1965, 49 y.o.   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   CURRENT MEDS . heparin subcutaneous  5,000 Units Subcutaneous 3 times per day  . levETIRAcetam  500 mg Intravenous Q12H  . methylPREDNISolone (SOLU-MEDROL) injection  40 mg Intravenous Q12H  . ondansetron        OBJECTIVE  Filed Vitals:   03/14/14 1030 03/14/14 1100 03/14/14 1112 03/14/14 1130  BP: 105/58 98/57 98/57  92/67  Pulse: 61 70 57 51  Temp:  99.1 F (37.3 C) 99.1 F (37.3 C)   TempSrc:   Core (Comment)   Resp: 16 12 14 11   Height:      Weight:      SpO2: 98% 94% 95% 93%    Intake/Output Summary (Last 24 hours) at 03/14/14 1204 Last data filed at 03/14/14 1100  Gross per 24 hour  Intake 1310.45 ml  Output    830 ml  Net 480.45 ml   Filed Weights   03/12/14 0300 03/13/14 0400 03/14/14 0330  Weight: 203 lb 11.3 oz (92.4 kg) 218 lb 0.6 oz (98.9 kg) 216 lb 4.7 oz (98.11 kg)    PHYSICAL EXAM  General: Pleasant, sill a bit confused but minimally so with reduced short term memory, NAD. Neuro: Alert and oriented X 3. Moves all extremities spontaneously. HEENT:  Normal  Neck: Supple without bruits or JVD. Lungs:  Resp regular and unlabored, CTA. Heart: RRR no s3, s4, or murmurs. Abdomen: Soft, non-tender, non-distended, BS + x 4.  Extremities: No clubbing, cyanosis or edema. DP/PT/Radials 2+ and equal bilaterally.  Accessory Clinical Findings  CBC  Recent Labs  03/13/14 0412 03/14/14 0500  WBC 17.6* 22.4*  NEUTROABS  16.6* 21.1*  HGB 15.7 15.1  HCT 44.6 42.5  MCV 85.6 85.5  PLT 173 169   Basic Metabolic Panel  Recent Labs  03/13/14  03/13/14 0507  03/13/14 1500 03/14/14 0500  NA  --   < > 131*  < > 129* 130*  K  --   < > 3.8  < > 4.7 4.8  CL  --   < > 100  < > 100 97  CO2  --   < > 18*  < > 20 22  GLUCOSE  --   < > 251*  < > 122* 143*  BUN  --   < > 22  < > 27* 35*  CREATININE  --   < > 1.38*  < > 1.58* 1.74*  CALCIUM  --   < > 8.5  < > 8.4 8.5  MG 1.7  --  1.7  --   --   --   < > = values in this interval not displayed. Liver Function Tests  Recent Labs  03/13/14 0507 03/14/14 0500  AST 47* 30  ALT 66* 41  ALKPHOS 76 54  BILITOT 0.9 1.3*  PROT 6.4 5.3*  ALBUMIN 3.4* 2.9*   No results for input(s): LIPASE, AMYLASE in the last 72 hours. Cardiac Enzymes  Recent Labs  03/11/14 2337 03/12/14 0401 03/12/14 1001  TROPONINI 0.08* 0.44* 0.58*   BNP Invalid input(s): POCBNP D-Dimer No results for input(s): DDIMER in the last 72 hours. Hemoglobin A1C  Recent Labs  03/13/14 0412  HGBA1C 5.5   Fasting Lipid Panel No results for input(s): CHOL, HDL, LDLCALC, TRIG, CHOLHDL, LDLDIRECT in the last 72 hours. Thyroid Function Tests No results for input(s): TSH, T4TOTAL, T3FREE, THYROIDAB in the last 72 hours.  Invalid input(s): FREET3  TELE NSR with PVC's.  Radiology/Studies  Dg Chest 1 View  03/13/2014   CLINICAL DATA:  Cardiac arrest.  Sarcoid.  EXAM: CHEST - 1 VIEW  COMPARISON:  03/12/2014  FINDINGS: Endotracheal tube in good position. Central venous catheter tip in the lower SVC. No pneumothorax. Gastric tube in the stomach.  Improvement in left upper lobe and left lower lobe airspace disease. Improvement in right perihilar infiltrate. Progression of right middle lobe collapse. No significant effusion.  IMPRESSION: Improving bilateral infiltrates. Progression of right middle lobe collapse.   Electronically Signed   By: Marlan Palau M.D.   On: 03/13/2014 07:27   Dg Chest  Port 1 View  03/14/2014   CLINICAL DATA:  Respiratory failure.  EXAM: PORTABLE CHEST - 1 VIEW  COMPARISON:  03/13/2014.  FINDINGS: Endotracheal tube, NG tube, left of the stent position. Persistent bilateral pulmonary infiltrates consistent with pneumonia. Interim partial clearing of right base atelectasis. No pleural effusion or pneumothorax. Stable cardiomegaly with normal pulmonary vascularity. Degenerative changes thoracic spine.  IMPRESSION: 1. Persistent bilateral pulmonary infiltrates consistent pneumonia. 2. Interim partial clearing of right base atelectasis. 3. Stable cardiomegaly.   Electronically Signed   By: Maisie Fus  Register   On: 03/14/2014 07:36   Dg Chest Port 1 View  03/12/2014   CLINICAL DATA:  Central line placement.  EXAM: PORTABLE CHEST - 1 VIEW  COMPARISON:  03/11/2014  FINDINGS: Endotracheal tube tip measures 5.3 cm above the carinal. Enteric tube tip projects over the EG junction. Interval placement of a left central venous catheter with tip over the cavoatrial junction. No pneumothorax. Persistent cardiac enlargement and perihilar infiltrates.  IMPRESSION: Left central venous catheter with tip over the cavoatrial junction. No pneumothorax. Enteric tube tip remains at the level of the EG junction. Cardiac enlargement. Bilateral perihilar infiltrates.   Electronically Signed   By: Burman Nieves M.D.   On: 03/12/2014 03:23   Dg Chest Port 1 View  03/12/2014   CLINICAL DATA:  Post CPR to assess endotracheal tube.  EXAM: PORTABLE CHEST - 1 VIEW  COMPARISON:  None.  FINDINGS: Two films were obtained, initial image demonstrating endotracheal tube tip 9 cm above the carina and second image demonstrating endotracheal tube tip 3.9 cm above the carina. Enteric tube tip is at the level of the EG junction with proximal side hole probably in the lower esophagus. Shallow inspiration. Cardiac enlargement with normal pulmonary vascularity. Focal opacity in the left mid lung consistent with infiltration  or atelectasis. No blunting of costophrenic angles. No pneumothorax. Nodule in the right mid lung probably represents a calcified granuloma.  IMPRESSION: Endotracheal tube appears to be in good position. Enteric tube is somewhat shallow with proximal side hole probably in the distal esophageal region. Cardiac enlargement. Infiltration or atelectasis in the left mid lung.   Electronically Signed   By: Burman Nieves M.D.   On: 03/12/2014 00:13   Dg Abd Portable 1v  03/12/2014   CLINICAL DATA:  OG placement.  EXAM: PORTABLE ABDOMEN - 1 VIEW  COMPARISON:  None.  FINDINGS: Enteric tube tip projects over the EG junction region with proximal side hole projecting over the lower esophagus. Cardiac enlargement. Infiltrates noted in the lower lungs.  IMPRESSION: Enteric tube tip projects over the EG junction with proximal side hole probably in the lower esophagus.   Electronically Signed   By: Burman Nieves M.D.   On: 03/12/2014 03:25   Ct Portable Head W/o Cm  03/12/2014   CLINICAL DATA:  Status post CPR, cardiac arrest. Acute encephalopathy.  EXAM: CT HEAD WITHOUT CONTRAST  TECHNIQUE: Contiguous axial images were obtained from the base of the skull through the vertex without intravenous contrast.  COMPARISON:  None.  FINDINGS: The ventricles and sulci are normal. Somewhat poor gray-white matter differentiation though, there is streak artifact from life support lines. No intraparenchymal hemorrhage, mass effect nor midline shift. No acute large vascular territory infarcts.  No abnormal extra-axial fluid collections. Basal cisterns are patent.  No skull fracture. The included ocular globes and orbital contents are non-suspicious. Bilateral maxillary mucosal retention cyst, moderate paranasal sinusitis. Mastoid air cells are well aerated.  IMPRESSION: Mild loss of the expected gray-white matter differentiation, this could be artifact or reflect very early global brain edema/ anoxia. Recommend followup.    Electronically Signed   By: Awilda Metro   On: 03/12/2014 06:00    ASSESSMENT AND PLAN  1. VT/VF arrest 2. S/p successful rescucitation with cooling 3. Residual anoxic encephalopathy, improving quickly 4. Non-ischemic CM 5. VT storm, quiet now on IV amiodarone. Rec: he continues to improve and VT is quiet. I would suggest another day of IV amio and switch him to po if able to take po tomorrow. Might need a swallowing study. Cardiac MRI next week if he can tolerate. ICD therafter.  Gregg Taylor,M.D.  03/14/2014 12:04 PM

## 2014-03-14 NOTE — Progress Notes (Signed)
PULMONARY / CRITICAL CARE MEDICINE   Name: Christopher Patrick MRN: [id number] DOB: [birth year]    ADMISSION DATE:  03/11/2014 CONSULTATION DATE:  03/11/14   REFERRING MD :  ED Dr. Preston Fleeting  CHIEF COMPLAINT:  Cardiac arrest   INITIAL PRESENTATION: 48 PMH sarcoid, GERD presenting s/p cardiac arrest found at home by wife initially alert then progressed to AMS. Prior to this he had hand paresthesias and palpitations a day ago.  EMS arrived VT, VF arrest w/o pulse CPR initiated ~10:30 PM with shocked x 3, given Epi x 2 transported to [facility]. CPR x 15-20 min and amio150 bolus and started on Lidocaine drip. He was intubated in the ED and cooled in the unit.    STUDIES:  1/6 CT head>>Mild loss of the expected gray-white matter differentiation, this could be artifact or reflect very early global brain edema/ anoxia. Recommend followup.  1/6 CXR Endotracheal tube appears to be in good position. Enteric tube is somewhat shallow with proximal side hole probably in the distal esophageal region. Cardiac enlargement. Infiltration or atelectasis in the left mid lung.  1/6 CXR Left central venous catheter with tip over the cavoatrial junction. No pneumothorax. Enteric tube tip remains at the level of the EG junction. Cardiac enlargement. Bilateral perihilar infiltrates  1/6 Ab portable Enteric tube tip projects over the EG junction with proximal side hole probably in the lower esophagus.  1/6 echo EF 15-20%, diffuse HK, RV mod dilated, RA mild dilated  1/7 CXR improving b/l infiltrates progress right lower lobe/segmental collapse   1/8 Persistent bilateral pulmonary infiltrates consistent pneumonia, partial clearing of right base atelectasis. Stable cardiomegaly.  SIGNIFICANT EVENTS: 1/6>>VF arrest  1/6>>VT 8:33 shocked x 1, VT 8:35 shocked again and again at 10:13 w/ Amio bolus x 2, VT shock again at 11:43 with another amio bolus  1/7>>? Seizure like activity 1/8>>extubated    HISTORY OF PRESENT  ILLNESS:   48 PMH sarcoid, GERD presenting s/p cardiac arrest found at home by wife initially alert then progressed to AMS. Prior to this he had hand paresthesias and palpitations a day ago.  EMS arrived VT, VF arrest w/o pulse CPR initiated ~10:30 PM with shocked x 3, given Epi x 2 transported to [facility]. CPR x 15-20 min and amio150 bolus and started on Lidocaine drip. He was intubated in the ED and cooled in the unit with multiple episodes of VT.   PAST MEDICAL HISTORY :   has a past medical history of Sarcoid and GERD (gastroesophageal reflux disease).  has past surgical history that includes left heart catheterization with coronary angiogram (N/A, 03/12/2014).  Medications Prior to Admission  Medication Sig Dispense Refill  . pantoprazole (PROTONIX) 20 MG tablet Take 20 mg by mouth 2 (two) times daily.     No Known Allergies  FAMILY HISTORY:  has no family status information on file.  SOCIAL HISTORY:  reports that he does not drink alcohol.  REVIEW OF SYSTEMS:   Unable to obtain due to ETT  SUBJECTIVE:  Unable to obtain due to ETT   VITAL SIGNS: Temp:  [95.2 F (35.1 C)-99.1 F (37.3 C)] 98.8 F (37.1 C) (01/08 1000) Pulse Rate:  [46-141] 64 (01/08 0937) Resp:  [14-30] 16 (01/08 0937) BP: (83-153)/(57-93) 86/65 mmHg (01/08 0937) SpO2:  [85 %-100 %] 99 % (01/08 0937) Arterial Line BP: (59-117)/(54-83) 59/54 mmHg (01/07 2130) FiO2 (%):  [40 %-60 %] 40 % (01/08 0809) Weight:  [216 lb 4.7 oz (98.11 kg)] 216 lb 4.7  oz (98.11 kg) (01/08 0330) HEMODYNAMICS: CVP:  [8 mmHg-30 mmHg] 30 mmHg VENTILATOR SETTINGS: Vent Mode:  [-] PRVC FiO2 (%):  [40 %-60 %] 40 % Set Rate:  [14 bmp-21 bmp] 14 bmp Vt Set:  [600 mL] 600 mL PEEP:  [5 cmH20] 5 cmH20 Plateau Pressure:  [16 cmH20-18 cmH20] 16 cmH20 INTAKE / OUTPUT:  Intake/Output Summary (Last 24 hours) at 03/14/14 1027 Last data filed at 03/14/14 1000  Gross per 24 hour  Intake 1607.45 ml  Output    860 ml  Net 747.45 ml     PHYSICAL EXAMINATION: General:  Resting in bed sl agitated with ETT Neuro: awake following commands  HEENT:  Eyes open, ETT intact Cardiovascular:  Reg rate, rhythm with PVCs Lungs:  ctab Abdomen:  Soft, nl bs, ntnd Musculoskeletal:  Nl muscle tone Skin:  Intact   LABS:  CBC  Recent Labs Lab 03/12/14 0701 03/13/14 0412 03/14/14 0500  WBC 14.3* 17.6* 22.4*  HGB 17.5* 15.7 15.1  HCT 49.5 44.6 42.5  PLT 209 173 169   Coag's  Recent Labs Lab 03/12/14 0300 03/12/14 1100  APTT 34 41*  INR 1.17 1.25   BMET  Recent Labs Lab 03/13/14 1400 03/13/14 1500 03/14/14 0500  NA 132* 129* 130*  K 4.3 4.7 4.8  CL 99 100 97  CO2 24 20 22   BUN 78* 27* 35*  CREATININE 1.71* 1.58* 1.74*  GLUCOSE 153* 122* 143*   Electrolytes  Recent Labs Lab 03/11/14 2337  03/13/14  03/13/14 0507  03/13/14 1400 03/13/14 1500 03/14/14 0500  CALCIUM 8.8  < >  --   < > 8.5  < > 9.4 8.4 8.5  MG 2.0  --  1.7  --  1.7  --   --   --   --   < > = values in this interval not displayed. Sepsis Markers  Recent Labs Lab 03/11/14 2354 03/12/14 0415 03/13/14 0413  LATICACIDVEN 4.33* 2.6* 6.5*   ABG  Recent Labs Lab 03/12/14 0540 03/13/14 0423 03/14/14 0500  PHART 7.449 7.216* 7.427  PCO2ART 31.5* 43.8 32.4*  PO2ART 222.0* 101.0* 170.0*   Liver Enzymes  Recent Labs Lab 03/13/14 0507 03/14/14 0500  AST 47* 30  ALT 66* 41  ALKPHOS 76 54  BILITOT 0.9 1.3*  ALBUMIN 3.4* 2.9*   Cardiac Enzymes  Recent Labs Lab 03/11/14 2337 03/12/14 0401 03/12/14 1001  TROPONINI 0.08* 0.44* 0.58*   Glucose  Recent Labs Lab 03/13/14 0801 03/13/14 1155 03/13/14 2019 03/13/14 2351 03/14/14 0509 03/14/14 0724  GLUCAP 221* 144* 107* 120* 135* 127*    Imaging Dg Chest 1 View  03/13/2014   CLINICAL DATA:  Cardiac arrest.  Sarcoid.  EXAM: CHEST - 1 VIEW  COMPARISON:  03/12/2014  FINDINGS: Endotracheal tube in good position. Central venous catheter tip in the lower SVC. No  pneumothorax. Gastric tube in the stomach.  Improvement in left upper lobe and left lower lobe airspace disease. Improvement in right perihilar infiltrate. Progression of right middle lobe collapse. No significant effusion.  IMPRESSION: Improving bilateral infiltrates. Progression of right middle lobe collapse.   Electronically Signed   By: Marlan Palau M.D.   On: 03/13/2014 07:27     ASSESSMENT / PLAN:  PULMONARY OETT 1/6>>1/8 A: Acute respiratory failure, VDRF improved  1/8 CXR Persistent bilateral pulmonary infiltrates consistent pneumonia, partial clearing of right base atelectasis. Stable cardiomegaly.  P:   Extubated 1/8, CXR in am Solumedrol 80 q12 decreased to 40 q12  CARDIOVASCULAR CVL Left IJ 1/6, PIV x 1 A:  S/p cardiac arrest Recurrent multifocal/monomorphic VT Elevated troponin thought to be 2/2 VT 1st degree heart block, RBBB Prolonged QTc 161>096>045 ? 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 Ladona Ridgel) following who is considering BiVICD and cardiac MRI and repeat echo in 2-3 days Lido gtt off, Amio gtt @ 30 On Levo will titrate off today  RENAL A:   Non AG Metabolic acidosis/Metabolic Alkalosis/Respiratory Acidosis, resolved  Hypokalemia resolved  Hyponatremia stable  P:   Strict i/o daily weights  Pending hyponatremia w/u    GASTROINTESTINAL A:   NPO w Post intubation dysphagia  nausea  P:   SLP to eval  Prn Zofran   HEMATOLOGIC A:   DVT px   P:  Scds, heparin sq   INFECTIOUS A:   Leukocytosis trending up likely 2/2 steroids   P:   BCx2  Not obtained UC not obtained Sputum FEW GRAM NEGATIVE RODS RARE GRAM POSITIVE COCCI IN PAIRS IN CLUSTERS, still pending MRSA neg  Abx: none  ENDOCRINE A:   Hyperglycemia improve  P:   HA1C 5.5  SSI q4 will d/c, consider SSI if still elevated when pt starts eating   NEUROLOGIC A:    No longer sedated Encephalopathy, resolved  Concern for seizure activity upon rewarming 1/7  P:   borderling EEG but no seizure activity noted 1/7 Neuro following currently on Keppra 500 q12, prn Ativan   RASS goal:0 currently at -1 Fentanyl off, Versed off, Nimbex off    FAMILY  - Updates: wife at bedside by updated by Dr. Bard Herbert 1/8  - Inter-disciplinary family meet or Palliative Care meeting due by:  03/19/14     TODAY'S SUMMARY: multiform/monomorphic VT/cardiac arrest with NI 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 Cardiology and EP following consider BiVICD, repeat echo in 2-3 days. Extubated today   The patient is critically ill with multiple organ systems failure and requires high complexity decision making for assessment and support, frequent evaluation and titration of therapies, application of advanced monitoring technologies and extensive interpretation of multiple databases.    03/14/2014, 10:27 AM Desma Maxim MD PGY 3 IM   PCCM ATTENDING: I have reviewed pt's initial presentation, consultants notes and hospital database in detail.  The above assessment and plan was formulated under my direction.  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 He appears to have responded favorably to steroids though it is of course impossible to sort out from response to there therapies VDRF - extubated successfully today  I have updated wife in detail I have decreased methylpred dose to 40 mg IV q 12 hrs When he is taking PO medications, his steroids can be changed to prednisone 60 mg daily  Would probably keep on this dose for 2-4 weeks before tapering  If he remains extubated and ready to transfer out of ICU, he should be transferred to [facility] or to Cards service @ that time  Billy Fischer, MD;  PCCM service; Mobile [phone]

## 2014-03-14 NOTE — Progress Notes (Signed)
Patient Profile: 49 y/o male with history of sarcoid but no known past cardiac history admitted 03/11/14 after sustaining an out of hospital cardiac arrest secondary to VT. S/p resuscitation w/ CPR, epi and amio. Intubated and placed on Artic Sun protocol. Once admitted he had VT storm requiring additional shocks. Now on Amio drip and lidocaine. 2D echo 1/6 demonstrated severely redcued EF of 15-20%. LHC 1/6 demonstrated normal coronaries suggesting nonischemic cardiomyopathy.    Subjective: Remains intubated but plans are to attempt extubation today.   Objective: Vital signs in last 24 hours: Temp:  [94.8 F (34.9 C)-99.1 F (37.3 C)] 98.8 F (37.1 C) (01/08 0900) Pulse Rate:  [46-141] 64 (01/08 0937) Resp:  [14-30] 16 (01/08 0937) BP: (83-153)/(57-93) 86/65 mmHg (01/08 0937) SpO2:  [85 %-100 %] 99 % (01/08 0937) Arterial Line BP: (59-117)/(54-83) 59/54 mmHg (01/07 2130) FiO2 (%):  [40 %-60 %] 40 % (01/08 0809) Weight:  [216 lb 4.7 oz (98.11 kg)] 216 lb 4.7 oz (98.11 kg) (01/08 0330)    Intake/Output from previous day: 01/07 0701 - 01/08 0700 In: 1904.8 [I.V.:1374.8; NG/GT:30; IV Piggyback:500] Out: 590 [Urine:590] Intake/Output this shift: Total I/O In: 32 [I.V.:32] Out: 75 [Urine:75]  Medications Current Facility-Administered Medications  Medication Dose Route Frequency Provider Last Rate Last Dose  . amiodarone (NEXTERONE PREMIX) 360 MG/200ML (1.8 mg/mL) IV infusion  30 mg/hr Intravenous Continuous Hillis Range, MD 16.7 mL/hr at 03/14/14 0900 30 mg/hr at 03/14/14 0900  . fentaNYL (SUBLIMAZE) injection 12.5-25 mcg  12.5-25 mcg Intravenous Q2H PRN Merwyn Katos, MD      . heparin injection 5,000 Units  5,000 Units Subcutaneous 3 times per day Annett Gula, MD   5,000 Units at 03/14/14 [phone]  . insulin aspart (novoLOG) injection 0-15 Units  0-15 Units Subcutaneous 6 times per day Merwyn Katos, MD   2 Units at 03/14/14 [phone]  . levETIRAcetam (KEPPRA) IVPB 500 mg/100 mL  premix  500 mg Intravenous Q12H Vishal Mungal, MD   500 mg at 03/14/14 0503  . LORazepam (ATIVAN) injection 2 mg  2 mg Intravenous PRN Merwyn Katos, MD      . methylPREDNISolone sodium succinate (SOLU-MEDROL) 40 mg/mL injection 40 mg  40 mg Intravenous Q12H Merwyn Katos, MD      . norepinephrine (LEVOPHED) 16 mg in dextrose 5 % 250 mL (0.064 mg/mL) infusion  0-50 mcg/min Intravenous Titrated Merwyn Katos, MD 15.9 mL/hr at 03/14/14 0900 17 mcg/min at 03/14/14 0900    PE: General appearance: alert, cooperative and no distress Neck: no carotid bruit and no JVD Lungs: coarse BS anteriorly Heart: regular rate and rhythm, S1, S2 normal, no murmur, click, rub or gallop Extremities: no LEE Pulses: 2+ and symmetric Skin: Skin color, texture, turgor normal. No rashes or lesions or  Neurologic: intubated and sedated  Lab Results:   Recent Labs  03/12/14 0701 03/13/14 0412 03/14/14 0500  WBC 14.3* 17.6* 22.4*  HGB 17.5* 15.7 15.1  HCT 49.5 44.6 42.5  PLT 209 173 169   BMET  Recent Labs  03/13/14 1400 03/13/14 1500 03/14/14 0500  NA 132* 129* 130*  K 4.3 4.7 4.8  CL 99 100 97  CO2 GLUCOSE 153* 122* 143*  BUN 78* 27* 35*  CREATININE 1.71* 1.58* 1.74*  CALCIUM 9.4 8.4 8.5   PT/INR  Recent Labs  03/12/14 0300 03/12/14 1100  LABPROT 15.1 15.8*  INR 1.17 1.25    Assessment/Plan  Principal Problem:   Cardiac  arrest Active Problems:   VT (ventricular tachycardia) - Multifocal monomorphic   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 Cardiac Arrest: in the setting of pulseless VT. Resuscitated with CPR, epi and amio. Now on Artic Sun protocol. EF15-20%.  Nonischemic cardiomyopathy with normal cors on cath. Will plan to repeat 2D echo and get a cardiac MRI after Graciella Freer is removed.  Will need ICD for secondary prevention if meaningful neurologic recovery.   1. VT Storm: No recurrent VT/NSVT. At minimum he has had some isolated PVCs. Lidocaine was discontinued yesterday. He remains on IV Amiodarone. K and Mg are WNL. Plans are to attempt extubation today. Dr. Ladona Ridgel will address timing of transition to PO Amiodarone. ? If best to keep IV amiodarone running until after ICD placement.   MD to assess and will provide further recommendations.     LOS: 3 days    Brittainy M. Delmer Islam 03/14/2014 9:56 AM  EP Attending  Patient seen and examined. See my rounding note as well.  Leonia Reeves.D.

## 2014-03-15 ENCOUNTER — Inpatient Hospital Stay (HOSPITAL_COMMUNITY): Payer: 59

## 2014-03-15 DIAGNOSIS — I442 Atrioventricular block, complete: Secondary | ICD-10-CM

## 2014-03-15 LAB — BASIC METABOLIC PANEL
Anion gap: 21 — ABNORMAL HIGH (ref 5–15)
BUN: 39 mg/dL — AB (ref 6–23)
CO2: 23 mmol/L (ref 19–32)
CREATININE: 1.7 mg/dL — AB (ref 0.50–1.35)
Calcium: 8 mg/dL — ABNORMAL LOW (ref 8.4–10.5)
Chloride: 95 mEq/L — ABNORMAL LOW (ref 96–112)
GFR calc Af Amer: 52 mL/min — ABNORMAL LOW (ref >90)
GFR calc non Af Amer: 45 mL/min — ABNORMAL LOW (ref >90)
Glucose, Bld: 134 mg/dL — ABNORMAL HIGH (ref 70–99)
POTASSIUM: 4.6 mmol/L (ref 3.5–5.1)
Sodium: 139 mmol/L (ref 135–145)

## 2014-03-15 LAB — TROPONIN I: Troponin I: 0.09 ng/mL — ABNORMAL HIGH (ref <0.031)

## 2014-03-15 LAB — GLUCOSE, CAPILLARY
GLUCOSE-CAPILLARY: 142 mg/dL — AB (ref 70–99)
Glucose-Capillary: 155 mg/dL — ABNORMAL HIGH (ref 70–99)
Glucose-Capillary: 140 mg/dL — ABNORMAL HIGH (ref 70–99)

## 2014-03-15 LAB — CBC WITH DIFFERENTIAL/PLATELET
BASOS PCT: 0 % (ref 0–1)
Basophils Absolute: 0 10*3/uL (ref 0.0–0.1)
EOS ABS: 0 10*3/uL (ref 0.0–0.7)
Eosinophils Relative: 0 % (ref 0–5)
HCT: 38.8 % — ABNORMAL LOW (ref 39.0–52.0)
Hemoglobin: 13.3 g/dL (ref 13.0–17.0)
LYMPHS PCT: 3 % — AB (ref 12–46)
Lymphs Abs: 0.4 10*3/uL — ABNORMAL LOW (ref 0.7–4.0)
MCH: 30.6 pg (ref 26.0–34.0)
MCHC: 34.3 g/dL (ref 30.0–36.0)
MCV: 89.2 fL (ref 78.0–100.0)
Monocytes Absolute: 0.6 10*3/uL (ref 0.1–1.0)
Monocytes Relative: 4 % (ref 3–12)
NEUTROS PCT: 93 % — AB (ref 43–77)
Neutro Abs: 13.2 10*3/uL — ABNORMAL HIGH (ref 1.7–7.7)
PLATELETS: 131 10*3/uL — AB (ref 150–400)
RBC: 4.35 MIL/uL (ref 4.22–5.81)
RDW: 14 % (ref 11.5–15.5)
WBC: 14.1 10*3/uL — ABNORMAL HIGH (ref 4.0–10.5)

## 2014-03-15 LAB — MAGNESIUM: Magnesium: 1.9 mg/dL (ref 1.5–2.5)

## 2014-03-15 MED ORDER — METOCLOPRAMIDE HCL 5 MG/ML IJ SOLN
10.0000 mg | Freq: Once | INTRAMUSCULAR | Status: AC
  Administered 2014-03-15: 10 mg via INTRAVENOUS
  Filled 2014-03-15: qty 2

## 2014-03-15 MED ORDER — PREDNISONE 50 MG PO TABS
60.0000 mg | ORAL_TABLET | Freq: Every day | ORAL | Status: DC
  Administered 2014-03-16 – 2014-03-20 (×5): 60 mg via ORAL
  Filled 2014-03-15 (×7): qty 1

## 2014-03-15 MED ORDER — ATROPINE SULFATE 0.1 MG/ML IJ SOLN
INTRAMUSCULAR | Status: AC
  Filled 2014-03-15: qty 10

## 2014-03-15 MED ORDER — AMIODARONE HCL 200 MG PO TABS
200.0000 mg | ORAL_TABLET | Freq: Every day | ORAL | Status: DC
  Administered 2014-03-15 – 2014-03-16 (×2): 200 mg via ORAL
  Filled 2014-03-15 (×2): qty 1

## 2014-03-15 MED ORDER — PANTOPRAZOLE SODIUM 40 MG PO TBEC
40.0000 mg | DELAYED_RELEASE_TABLET | Freq: Every day | ORAL | Status: DC
  Administered 2014-03-15 – 2014-03-19 (×3): 40 mg via ORAL
  Filled 2014-03-15 (×3): qty 1

## 2014-03-15 MED ORDER — SODIUM CHLORIDE 0.9 % IV SOLN: 12.5000 mg | Freq: Four times a day (QID) | INTRAVENOUS | Status: DC | PRN

## 2014-03-15 MED ORDER — RESOURCE THICKENUP CLEAR PO POWD
ORAL | Status: DC | PRN
  Filled 2014-03-15 (×2): qty 125

## 2014-03-15 MED ORDER — ATROPINE SULFATE 1 MG/ML IJ SOLN: 0.4000 mg | Freq: Once | INTRAMUSCULAR | Status: DC

## 2014-03-15 MED ORDER — HYDROCODONE-ACETAMINOPHEN 5-325 MG PO TABS
1.0000 | ORAL_TABLET | Freq: Four times a day (QID) | ORAL | Status: DC | PRN
  Administered 2014-03-15 (×2): 1 via ORAL
  Administered 2014-03-16: 2 via ORAL
  Administered 2014-03-16 (×3): 1 via ORAL
  Administered 2014-03-17 – 2014-03-18 (×2): 2 via ORAL
  Filled 2014-03-15: qty 2
  Filled 2014-03-15: qty 1
  Filled 2014-03-15: qty 2
  Filled 2014-03-15: qty 1
  Filled 2014-03-15: qty 2
  Filled 2014-03-15 (×3): qty 1

## 2014-03-15 MED ORDER — BACLOFEN 5 MG HALF TABLET
5.0000 mg | ORAL_TABLET | Freq: Three times a day (TID) | ORAL | Status: DC | PRN
Start: 2014-03-15 — End: 2014-03-18
  Administered 2014-03-15 – 2014-03-17 (×6): 5 mg via ORAL
  Filled 2014-03-15 (×11): qty 1

## 2014-03-15 NOTE — Progress Notes (Signed)
Patient Profile: 49 y/o male with history of sarcoid but no known past cardiac history admitted 03/11/14 after sustaining an out of hospital cardiac arrest secondary to VT/VF. S/p resuscitation w/ CPR, epi and amio. Intubated and placed on Artic Sun protocol. Once admitted he had VT storm requiring additional shocks.  2D echo 1/6 demonstrated severely redcued EF of 15-20%. LHC 1/6 demonstrated normal coronaries suggesting nonischemic cardiomyopathy.  No extubated.   Subjective: Extubated but confused.  Making some progress.  Denies SOB.  He has chest pain with inspiration and hiccups.    Objective: Vital signs in last 24 hours: Temp:  [97.5 F (36.4 C)-99.5 F (37.5 C)] 97.5 F (36.4 C) (01/09 0726) Pulse Rate:  [32-121] 44 (01/09 0900) Resp:  [11-25] 14 (01/09 0900) BP: (82-140)/(43-81) 122/71 mmHg (01/09 0900) SpO2:  [90 %-99 %] 99 % (01/09 0900)    Intake/Output from previous day: 01/08 0701 - 01/09 0700 In: 672.3 [I.V.:472.3; IV Piggyback:200] Out: 2225 [Urine:2225] Intake/Output this shift:    Medications Current Facility-Administered Medications  Medication Dose Route Frequency Provider Last Rate Last Dose  . amiodarone (PACERONE) tablet 200 mg  200 mg Oral Daily Hillis Range, MD      . fentaNYL (SUBLIMAZE) injection 12.5-25 mcg  12.5-25 mcg Intravenous Q2H PRN Merwyn Katos, MD   25 mcg at 03/15/14 0315  . heparin injection 5,000 Units  5,000 Units Subcutaneous 3 times per day Annett Gula, MD   5,000 Units at 03/15/14 0530  . levETIRAcetam (KEPPRA) IVPB 500 mg/100 mL premix  500 mg Intravenous Q12H Vishal Mungal, MD   500 mg at 03/15/14 0335  . LORazepam (ATIVAN) injection 2 mg  2 mg Intravenous PRN Merwyn Katos, MD      . methylPREDNISolone sodium succinate (SOLU-MEDROL) 40 mg/mL injection 40 mg  40 mg Intravenous Q12H Merwyn Katos, MD   40 mg at 03/15/14 0050  . norepinephrine (LEVOPHED) 16 mg in dextrose 5 % 250 mL (0.064 mg/mL) infusion  0-50 mcg/min  Intravenous Titrated Merwyn Katos, MD 4.7 mL/hr at 03/14/14 1500 5 mcg/min at 03/14/14 1500  . ondansetron (ZOFRAN) injection 4 mg  4 mg Intravenous Q6H PRN Annett Gula, MD   4 mg at 03/14/14 1049   Physical Exam: Tele- AV block, no VT, V rate 50s Filed Vitals:   03/15/14 0725 03/15/14 0726 03/15/14 0800 03/15/14 0900  BP: 140/80 140/80 129/81 122/71  Pulse:   45 44  Temp:  97.5 F (36.4 C)    TempSrc:  Oral    Resp: Height:      Weight:      SpO2:  95% 99% 99%    GEN- The patient is ill appearing, alert but confused Head- normocephalic, atraumatic Eyes-  Sclera clear, conjunctiva pink Ears- hearing intact Oropharynx- clear Neck- supple,L CVL in place Lungs- Clear to ausculation bilaterally, normal work of breathing Heart- Regular rate and rhythm,  GI- soft, NT, ND, + BS, + hiccups Extremities- no clubbing, cyanosis, or edema  MS- no significant deformity or atrophy Skin- no rash or lesion Psych- euthymic mood, full affect Neuro- strength and sensation are intact Foley in place   Lab Results:   Recent Labs  03/13/14 0412 03/14/14 0500 03/15/14 0330  WBC 17.6* 22.4* 14.1*  HGB 15.7 15.1 13.3  HCT 44.6 42.5 38.8*  PLT 173 169 131*   BMET  Recent Labs  03/13/14 1500 03/14/14 0500 03/15/14 0330  NA 129* 130* 139  K 4.7 4.8 4.6  CL 100 97 95*  CO2 GLUCOSE 122* 143* 134*  BUN 27* 35* 39*  CREATININE 1.58* 1.74* 1.70*  CALCIUM 8.4 8.5 8.0*   PT/INR  Recent Labs  03/12/14 1100  LABPROT 15.8*  INR 1.25    Studies/Results:  2D echo 03/12/14 Study Conclusions  - Left ventricle: The cavity size was normal. Wall thickness was normal. Systolic function was severely reduced. The estimated ejection fraction was in the range of 15% to 20%. Diffuse hypokinesis. - Right ventricle: The cavity size was moderately dilated. Systolic function was severely reduced. - Right atrium: The atrium was mildly dilated.    LHC  03/12/14 Hemodynamics: AO: 97/76 mmHg LV: 106/15 mmHg LVEDP: 18 mmHg  Coronary angiography: Coronary dominance: right    Left Main: Normal  Left Anterior Descending (LAD): Normal in size with no significant disease.  1st diagonal (D1): Normal in size with no significant disease.  2nd diagonal (D2): Medium in size with no significant disease.  3rd diagonal (D3): Medium in size with no significant disease.  Circumflex (LCx): Normal in size and nondominant. There is the vessel has no significant disease.  1st obtuse marginal: Large in size with no significant disease.  2nd obtuse marginal: Normal in size with no significant disease.  3rd obtuse marginal: Medium in size with no significant disease.  Right Coronary Artery: Large in size and dominant. The vessel has no significant disease.  Posterior descending artery: Normal in size with no significant disease.  Posterior AV segment: Normal  Posterolateral branchs: Normal  Left ventriculography: Was not performed. Ejection fraction was 15-20% by echo.   Final Conclusions:  1. Normal coronary arteries. 2. Mildly elevated left ventricular end-diastolic pressure.  3. 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 VT Storm:   Appears stable off of amiodarone and lidocaine.   Will restart amiodarone  daily once taking POs.  More aggressive therapy is limited by AV block.  Keep Mg .1.9 and K >3.9 Once he is clinically more stable, will obtain a cardiac MRI (hopefully Monday) and then proceed with BiV ICD  insertion  Really need CVLs/ foley out prior to device implant.  It would be nice if we could get CVL out tomorrow (Sunday) or even today.  Will defer to PCCM on this.  2. Complete heart block Suggestive of sarcoid or infiltrative process. On IV steroids Rhythm is presently stable with V escape 50s.  Will place Zoll pads We should try to avoid temporary pacing wire if possible.  Presently, this in not indicated.  3. Acute systolic dysfunction.  Upon discussion with the patient's wife, the patients father and three paternal uncles all died suddenly.  Though the patients father was said to have died of stroke at autopsy, the others were not explained and included a 37 month old, 51 year old, and 51 year old, all dying of sudden death.  I think it is therefore important that we better determine the cause of his cardiomyopathy and arrhythmias.  Cardiac MRI once more stable. He may also benefit from Advanced CHF team  evaluation after cardiac MRI to help [place] sort out the etiology of his cardiomyopathy and manage long term. Ultimately, would anticipate BiV ICD prior to discharge.    EP to follow.   Hillis Range, MD 03/15/2014 10:52 AM

## 2014-03-15 NOTE — Progress Notes (Signed)
PULMONARY / CRITICAL CARE MEDICINE   Name: Christopher Patrick MRN: [id number] DOB: [birth year]    ADMISSION DATE:  03/11/2014 CONSULTATION DATE:  03/11/14   REFERRING MD :  ED Dr. Preston Fleeting  CHIEF COMPLAINT:  Cardiac arrest   INITIAL PRESENTATION: 48 PMH sarcoid, GERD presenting s/p cardiac arrest found at home by wife initially alert then progressed to AMS. Prior to this he had hand paresthesias and palpitations a day ago.  EMS arrived VT, VF arrest w/o pulse CPR initiated ~10:30 PM with shocked x 3, given Epi x 2 transported to [facility]. CPR x 15-20 min and amio150 bolus and started on Lidocaine drip. He was intubated in the ED and cooled in the unit.    STUDIES:  1/6 CT head>>Mild loss of the expected gray-white matter differentiation, this could be artifact or reflect very early global brain edema/ anoxia. Recommend followup.  1/6 CXR Endotracheal tube appears to be in good position. Enteric tube is somewhat shallow with proximal side hole probably in the distal esophageal region. Cardiac enlargement. Infiltration or atelectasis in the left mid lung.  1/6 CXR Left central venous catheter with tip over the cavoatrial junction. No pneumothorax. Enteric tube tip remains at the level of the EG junction. Cardiac enlargement. Bilateral perihilar infiltrates  1/6 Ab portable Enteric tube tip projects over the EG junction with proximal side hole probably in the lower esophagus.  1/6 echo EF 15-20%, diffuse HK, RV mod dilated, RA mild dilated  1/7 CXR improving b/l infiltrates progress right lower lobe/segmental collapse   1/8 Persistent bilateral pulmonary infiltrates consistent pneumonia, partial clearing of right base atelectasis. Stable cardiomegaly.  SIGNIFICANT EVENTS: 1/6>>VF arrest  1/6>>VT 8:33 shocked x 1, VT 8:35 shocked again and again at 10:13 w/ Amio bolus x 2, VT shock again at 11:43 with another amio bolus  1/7>>? Seizure like activity 1/8>>extubated    SUBJECTIVE:  Some  asymptomatic bradycardia overnight, amiodarone held  VITAL SIGNS: Temp:  [97.5 F (36.4 C)-99.5 F (37.5 C)] 98.7 F (37.1 C) (01/09 1100) Pulse Rate:  [41-121] 44 (01/09 1500) Resp:  [12-25] 25 (01/09 1500) BP: (82-140)/(43-81) 112/62 mmHg (01/09 1500) SpO2:  [89 %-100 %] 92 % (01/09 1500) HEMODYNAMICS: CVP:  [12 mmHg] 12 mmHg VENTILATOR SETTINGS:   INTAKE / OUTPUT:  Intake/Output Summary (Last 24 hours) at 03/15/14 1551 Last data filed at 03/15/14 1300  Gross per 24 hour  Intake 466.92 ml  Output   2050 ml  Net -1583.08 ml    PHYSICAL EXAMINATION: General:  Resting in bed, comfortable Neuro: awake, alert, slightly forgetful and slow to respond but improving rapidly per wife HEENT:  OP clear, PERRL Cardiovascular:  Reg rate, rhythm with PVCs Lungs:  ctab Abdomen:  Soft, nl bs, ntnd Musculoskeletal:  Nl muscle tone Skin:  Intact   LABS:  CBC  Recent Labs Lab 03/13/14 0412 03/14/14 0500 03/15/14 0330  WBC 17.6* 22.4* 14.1*  HGB 15.7 15.1 13.3  HCT 44.6 42.5 38.8*  PLT 173 169 131*   Coag's  Recent Labs Lab 03/12/14 0300 03/12/14 1100  APTT 34 41*  INR 1.17 1.25   BMET  Recent Labs Lab 03/13/14 1500 03/14/14 0500 03/15/14 0330  NA 129* 130* 139  K 4.7 4.8 4.6  CL 100 97 95*  CO2 BUN 27* 35* 39*  CREATININE 1.58* 1.74* 1.70*  GLUCOSE 122* 143* 134*   Electrolytes  Recent Labs Lab 03/13/14  03/13/14 0507  03/13/14 1500 03/14/14 0500 03/15/14 0330  CALCIUM  --   < > 8.5  < > 8.4 8.5 8.0*  MG 1.7  --  1.7  --   --   --  1.9  < > = values in this interval not displayed. Sepsis Markers  Recent Labs Lab 03/11/14 2354 03/12/14 0415 03/13/14 0413  LATICACIDVEN 4.33* 2.6* 6.5*   ABG  Recent Labs Lab 03/12/14 0540 03/13/14 0423 03/14/14 0500  PHART 7.449 7.216* 7.427  PCO2ART 31.5* 43.8 32.4*  PO2ART 222.0* 101.0* 170.0*   Liver Enzymes  Recent Labs Lab 03/13/14 0507 03/14/14 0500  AST 47* 30  ALT 66* 41   ALKPHOS 76 54  BILITOT 0.9 1.3*  ALBUMIN 3.4* 2.9*   Cardiac Enzymes  Recent Labs Lab 03/12/14 0401 03/12/14 1001 03/15/14 0330  TROPONINI 0.44* 0.58* 0.09*   Glucose  Recent Labs Lab 03/14/14 0724 03/14/14 1111 03/14/14 1637 03/14/14 2350 03/15/14 0724 03/15/14 1119  GLUCAP 127* 108* 119* 142* 155* 140*    Imaging Dg Chest Port 1 View  03/14/2014   CLINICAL DATA:  Respiratory failure.  EXAM: PORTABLE CHEST - 1 VIEW  COMPARISON:  03/13/2014.  FINDINGS: Endotracheal tube, NG tube, left of the stent position. Persistent bilateral pulmonary infiltrates consistent with pneumonia. Interim partial clearing of right base atelectasis. No pleural effusion or pneumothorax. Stable cardiomegaly with normal pulmonary vascularity. Degenerative changes thoracic spine.  IMPRESSION: 1. Persistent bilateral pulmonary infiltrates consistent pneumonia. 2. Interim partial clearing of right base atelectasis. 3. Stable cardiomegaly.   Electronically Signed   By: Maisie Fus  Register   On: 03/14/2014 07:36     ASSESSMENT / PLAN:  PULMONARY OETT 1/6>>1/8 A: Acute respiratory failure, VDRF improved  1/8 CXR Persistent bilateral pulmonary infiltrates consistent pneumonia, partial clearing of right base atelectasis. Stable cardiomegaly.  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 He will likely need 24 months of prednisone with a slow taper to a maintenance dose.   RENAL A:   Non AG Metabolic acidosis/Metabolic Alkalosis/Respiratory Acidosis, resolved   Hypokalemia resolved  Hyponatremia stable  P:   Strict i/o daily weights  BMP   GASTROINTESTINAL A:   NPO w Post intubation dysphagia  nausea  P:   Taking modified diet  HEMATOLOGIC A:   DVT px   P:  Scds, heparin sq   INFECTIOUS A:   Leukocytosis trending up likely 2/2 steroids   P:   BCx2  Not obtained UC not obtained Sputum FEW GRAM NEGATIVE RODS RARE GRAM POSITIVE COCCI IN PAIRS IN CLUSTERS, still pending MRSA neg  Abx: none  ENDOCRINE A:   Hyperglycemia, improved  P:   HA1C 5.5  consider SSI if still elevated when pt starts eating   NEUROLOGIC A:   Encephalopathy, resolved  Concern for seizure activity upon rewarming 1/7  P:   borderline EEG but no seizure activity noted 1/7 Will d/c Keppra and follow    FAMILY  - Updates: wife at bedside by updated by Dr. Delton Coombes 1/9 - Inter-disciplinary family meet or Palliative Care meeting due by:  03/19/14    Levy Pupa,  MD, PhD 03/15/2014, 4:11 PM [facility] [phone] or if no answer [phone]

## 2014-03-15 NOTE — Plan of Care (Signed)
Problem: Phase III Progression Outcomes Goal: OOB with assistance Outcome: Not Progressing Pt has 3rd degree heart block Goal: Tolerating diet Outcome: Progressing Pt is on dysthagia 3 diet

## 2014-03-15 NOTE — Progress Notes (Addendum)
Event:  Notified that patient's rhythm is now CHB with ventricular rate of 51. He is asymptomatic.  Currently on levophed with BP 121/68.  Mentating well.  Plan for ICD Monday.  Will place temp wire if necessary and avoid nodal agents.  Wil hold amiodarone for now.

## 2014-03-15 NOTE — Progress Notes (Signed)
Per RN, RT does not need to change CVP at this time due to no longer needing.

## 2014-03-15 NOTE — Evaluation (Signed)
Clinical/Bedside Swallow Evaluation Patient Details  Name: Christopher Patrick MRN: [id number] Date of Birth: 05-21-1965  Today's Date: 03/15/2014 Time: 1015-1035 SLP Time Calculation (min) (ACUTE ONLY): 20 min  Past Medical History:  Past Medical History  Diagnosis Date  . Sarcoid   . GERD (gastroesophageal reflux disease)    Past Surgical History:  Past Surgical History  Procedure Laterality Date  . Left heart catheterization with coronary angiogram N/A 03/12/2014    Procedure: LEFT HEART CATHETERIZATION WITH CORONARY ANGIOGRAM;  Surgeon: Iran Ouch, MD;  Location: [facility] CATH LAB;  Service: Cardiovascular;  Laterality: N/A;   HPI:  The patient is a 49 y/o male with no prior cardiac history. His PMH is significant for sarcoid with known pulmonary involement and GERD. He presented to [facility] 03/11/14 after sustaining a witnessed out of hospital cardiac arrest at home.   Assessment / Plan / Recommendation Clinical Impression  Patient presents with a mild-moderate oropharyngeal dysphagia characterized by decreased mastication and oral manipulation and transit of hard solid bolus, and swallow initiation delays, decreased laryngeal elevation with thin liquids resulting in immediate cough response. Patient also presented with persistent hiccups that occured prior to, during, and after PO intake. Patient has a h/o GERD and has been on Protonix.     Aspiration Risk  Mild    Diet Recommendation Dysphagia 3 (Mechanical Soft);Nectar-thick liquid   Liquid Administration via: Cup Medication Administration: Whole meds with puree Supervision: Patient able to self feed;Full supervision/cueing for compensatory strategies Compensations: Slow rate;Small sips/bites Postural Changes and/or Swallow Maneuvers: Seated upright 90 degrees;Upright 30-60 min after meal    Other  Recommendations Recommended Consults: Other (Comment) (initiate PPI for GERD symptoms) Oral Care Recommendations: Oral care BID    Follow Up Recommendations   (pending progress, likely will not need f/u)    Frequency and Duration min 2x/week  1 week   Pertinent Vitals/Pain     SLP Swallow Goals     Swallow Study Prior Functional Status       General Date of Onset: 03/11/14 HPI: The patient is a 49 y/o male with no prior cardiac history. His PMH is significant for sarcoid with known pulmonary involement and GERD. He presented to [facility] 03/11/14 after sustaining a witnessed out of hospital cardiac arrest at home. Type of Study: Bedside swallow evaluation Previous Swallow Assessment: N/A Diet Prior to this Study: NPO Temperature Spikes Noted: No Respiratory Status: Nasal cannula History of Recent Intubation: Yes Length of Intubations (days): 4 days Date extubated: 03/14/14 Behavior/Cognition: Alert;Cooperative;Pleasant mood;Confused Oral Cavity - Dentition: Adequate natural dentition Self-Feeding Abilities: Able to feed self Patient Positioning: Upright in bed Baseline Vocal Quality: Clear Volitional Cough: Strong Volitional Swallow: Able to elicit    Oral/Motor/Sensory Function Overall Oral Motor/Sensory Function: Impaired Lingual Sensation: Other (Comment) (patient c/o tongue feeling "numb")   Ice Chips     Thin Liquid Thin Liquid: Impaired Presentation: Cup Pharyngeal  Phase Impairments: Suspected delayed Swallow;Decreased hyoid-laryngeal movement;Cough - Immediate    Nectar Thick Nectar Thick Liquid: Within functional limits Presentation: Cup Other Comments: No overt s/s aspiration with cup sips of nectar thick liquids   Honey Thick Honey Thick Liquid: Not tested   Puree Puree: Within functional limits Presentation: Self Fed Other Comments: No overt s/s aspiration or difficulty with puree solids   Solid   GO    Solid: Impaired Oral Phase Impairments: Impaired mastication;Impaired anterior to posterior transit Pharyngeal Phase Impairments: Suspected delayed Swallow       Fraser Din, Tessie Fass  03/15/2014,5:08 PM  Pablo Lawrence, MA, CCC-SLP

## 2014-03-16 LAB — BASIC METABOLIC PANEL
Anion gap: 5 (ref 5–15)
BUN: 36 mg/dL — ABNORMAL HIGH (ref 6–23)
CO2: 30 mmol/L (ref 19–32)
Calcium: 8.8 mg/dL (ref 8.4–10.5)
Chloride: 103 mEq/L (ref 96–112)
Creatinine, Ser: 1.29 mg/dL (ref 0.50–1.35)
GFR calc non Af Amer: 63 mL/min — ABNORMAL LOW (ref >90)
GFR, EST AFRICAN AMERICAN: 73 mL/min — AB (ref >90)
Glucose, Bld: 135 mg/dL — ABNORMAL HIGH (ref 70–99)
POTASSIUM: 5 mmol/L (ref 3.5–5.1)
Sodium: 138 mmol/L (ref 135–145)

## 2014-03-16 LAB — CULTURE, RESPIRATORY

## 2014-03-16 LAB — CULTURE, RESPIRATORY W GRAM STAIN

## 2014-03-16 LAB — MAGNESIUM: Magnesium: 2.2 mg/dL (ref 1.5–2.5)

## 2014-03-16 LAB — PHOSPHORUS: Phosphorus: 3.3 mg/dL (ref 2.3–4.6)

## 2014-03-16 MED ORDER — SODIUM CHLORIDE 0.45 % IV SOLN
INTRAVENOUS | Status: DC
  Administered 2014-03-17: 1000 mL via INTRAVENOUS

## 2014-03-16 MED ORDER — CHLORHEXIDINE GLUCONATE 4 % EX LIQD
60.0000 mL | Freq: Once | CUTANEOUS | Status: AC
  Administered 2014-03-16: 4 via TOPICAL

## 2014-03-16 MED ORDER — CEFAZOLIN SODIUM-DEXTROSE 2-3 GM-% IV SOLR
2.0000 g | INTRAVENOUS | Status: DC
  Filled 2014-03-16: qty 50

## 2014-03-16 MED ORDER — DOCUSATE SODIUM 100 MG PO CAPS
100.0000 mg | ORAL_CAPSULE | Freq: Two times a day (BID) | ORAL | Status: DC | PRN
  Administered 2014-03-16: 100 mg via ORAL
  Filled 2014-03-16 (×2): qty 1

## 2014-03-16 MED ORDER — CHLORHEXIDINE GLUCONATE 4 % EX LIQD
60.0000 mL | Freq: Once | CUTANEOUS | Status: AC
  Administered 2014-03-16: 4 via TOPICAL
  Filled 2014-03-16: qty 15

## 2014-03-16 MED ORDER — SODIUM CHLORIDE 0.9 % IV SOLN
INTRAVENOUS | Status: DC
  Administered 2014-03-17: 1000 mL via INTRAVENOUS

## 2014-03-16 MED ORDER — SODIUM CHLORIDE 0.9 % IR SOLN
80.0000 mg | Status: DC
  Filled 2014-03-16: qty 2

## 2014-03-16 NOTE — Progress Notes (Signed)
Patient Profile: 49 y/o male with history of sarcoid but no known past cardiac history admitted 03/11/14 after sustaining an out of hospital cardiac arrest secondary to VT/VF. S/p resuscitation w/ CPR, epi and amio. Intubated and placed on [place] protocol. Once admitted he had VT storm requiring additional shocks.  2D echo 1/6 demonstrated severely redcued EF of 15-20%. LHC 1/6 demonstrated normal coronaries suggesting nonischemic cardiomyopathy.  No extubated.   Subjective: Confusion continues to improve.  Making some progress.  Up to chair this AM  Denies SOB.  He has chest pain with inspiration and hiccups.    Objective: Vital signs in last 24 hours: Temp:  [98 F (36.7 C)-98.5 F (36.9 C)] 98 F (36.7 C) (01/10 0000) Pulse Rate:  [29-47] 35 (01/10 1100) Resp:  [7-25] 14 (01/10 1100) BP: (90-135)/(43-81) 110/50 mmHg (01/10 1100) SpO2:  [85 %-100 %] 97 % (01/10 1100)    Intake/Output from previous day: 01/09 0701 - 01/10 0700 In: 694.7 [P.O.:560; I.V.:34.7; IV Piggyback:100] Out: 1725 [Urine:1725] Intake/Output this shift: Total I/O In: 50 [P.O.:50] Out: -   Medications Current Facility-Administered Medications  Medication Dose Route Frequency Provider Last Rate Last Dose  . atropine injection 0.4 mg  0.4 mg Intravenous Once Hillis Range, MD   0.4 mg at 03/16/14 0800  . baclofen (LIORESAL) tablet 5 mg  5 mg Oral TID PRN Simonne Martinet, NP   5 mg at 03/16/14 0124  . fentaNYL (SUBLIMAZE) injection 12.5-25 mcg  12.5-25 mcg Intravenous Q2H PRN Merwyn Katos, MD   25 mcg at 03/15/14 0315  . heparin injection 5,000 Units  5,000 Units Subcutaneous 3 times per day Annett Gula, MD   5,000 Units at 03/16/14 0530  . HYDROcodone-acetaminophen (NORCO/VICODIN) 5-325 MG per tablet 1-2 tablet  1-2 tablet Oral Q6H PRN Leslye Peer, MD   1 tablet at 03/16/14 0528  . LORazepam (ATIVAN) injection 2 mg  2 mg Intravenous PRN Merwyn Katos, MD   2 mg at 03/15/14 2330  . ondansetron  (ZOFRAN) injection 4 mg  4 mg Intravenous Q6H PRN Annett Gula, MD   4 mg at 03/14/14 1049  . pantoprazole (PROTONIX) EC tablet 40 mg  40 mg Oral Daily Leslye Peer, MD   40 mg at 03/15/14 1659  . predniSONE (DELTASONE) tablet 60 mg  60 mg Oral Q breakfast Leslye Peer, MD   60 mg at 03/16/14 7902  . RESOURCE THICKENUP CLEAR   Oral PRN Merwyn Katos, MD       Physical Exam: Tele- high grade AV block (at times 2:1 AV block, at times complete heart block)  Filed Vitals:   03/16/14 0700 03/16/14 0900 03/16/14 1000 03/16/14 1100  BP: 101/53 97/43 115/57 110/50  Pulse: 29 39 36 35  Temp:      TempSrc:      Resp: 13 18 14 14   Height:      Weight:      SpO2: 97% 95% 93% 97%    GEN- The patient is ill appearing, sleeping but rouses Head- normocephalic, atraumatic Eyes-  Sclera clear, conjunctiva pink Ears- hearing intact Oropharynx- clear Neck- supple,L CVL in place Lungs- Clear to ausculation bilaterally, normal work of breathing Heart- Regular rate and rhythm,  GI- soft, NT, ND, + BS,  Extremities- no clubbing, cyanosis, or edema  MS- no significant deformity or atrophy Skin- no rash or lesion Psych- euthymic mood, full affect Neuro- strength and sensation are intact  Lab Results:   Recent Labs  03/14/14 0500 03/15/14 0330  WBC 22.4* 14.1*  HGB 15.1 13.3  HCT 42.5 38.8*  PLT 169 131*   BMET  Recent Labs  03/14/14 0500 03/15/14 0330 03/16/14 0500  NA 130* 139 138  K 4.8 4.6 5.0  CL 97 95* 103  CO2 GLUCOSE 143* 134* 135*  BUN 35* 39* 36*  CREATININE 1.74* 1.70* 1.29  CALCIUM 8.5 8.0* 8.8   PT/INR No results for input(s): LABPROT, INR in the last 72 hours.  Assessment/Plan  Principal Problem:   Cardiac arrest Active Problems:   VT (ventricular tachycardia) - Multifocal monomorphic   Pulmonary sarcoidosis - by report   Right bundle branch block   Atrioventricular (AV) dissociation   Non-ischemic cardiomyopathy - EF ~15-20% by  Echo   Shock circulatory   Acute respiratory failure with hypoxia   Acute encephalopathy   Sarcoidosis   1. VT Storm:   Appears stable off of amiodarone and lidocaine.   Will restart amiodarone  daily once ICD is in place.  More aggressive therapy is limited by AV block.  Keep Mg .1.9 and K >3.9 Once he is clinically more stable, will obtain a cardiac MRI (hopefully Monday) and then proceed with BiV ICD insertion  DC central lines today.  Foley is already out.  2. Complete heart block Suggestive of sarcoid or infiltrative process. On IV steroids Rhythm is presently stable with V escape 50s.  Will place Zoll pads We should try to avoid temporary pacing wire if possible.   3. Acute systolic dysfunction.  Upon discussion with the patient's wife, the patients father and three paternal uncles all died suddenly.  Though the patients father was said to have died of stroke at autopsy, the others were not explained and included a 24 month old, 51 year old, and 51 year old, all dying of sudden death.  I think it is therefore important that we better determine the cause of his cardiomyopathy and arrhythmias.  Cardiac MRI once more stable.  Unfortunately, given persistence of AV block, we may not be able to obtain a cardiac MRI and may have to proceed with CRT-D without it. He may also benefit from Advanced CHF team evaluation after cardiac MRI to help [place] sort out the etiology of his cardiomyopathy and manage long term. Given persistent AV block, I think that we should go ahead with CRT-D device tomorrow.  EP to follow.   Hillis Range, MD 03/16/2014 11:30 AM

## 2014-03-16 NOTE — Progress Notes (Signed)
PULMONARY / CRITICAL CARE MEDICINE   Name: Christopher Patrick MRN: [id number] DOB: Sep 12, 1965    ADMISSION DATE:  03/11/2014 CONSULTATION DATE:  03/11/14   REFERRING MD :  ED Dr. Preston Fleeting  CHIEF COMPLAINT:  Cardiac arrest   INITIAL PRESENTATION: 48 PMH sarcoid, GERD presenting s/p cardiac arrest found at home by wife initially alert then progressed to AMS. Prior to this he had hand paresthesias and palpitations a day ago.  EMS arrived VT, VF arrest w/o pulse CPR initiated ~10:30 PM with shocked x 3, given Epi x 2 transported to [facility]. CPR x 15-20 min and amio150 bolus and started on Lidocaine drip. He was intubated in the ED and cooled in the unit.    STUDIES:  1/6 CT head>>Mild loss of the expected gray-white matter differentiation, this could be artifact or reflect very early global brain edema/ anoxia. Recommend followup.  1/6 CXR Endotracheal tube appears to be in good position. Enteric tube is somewhat shallow with proximal side hole probably in the distal esophageal region. Cardiac enlargement. Infiltration or atelectasis in the left mid lung.  1/6 CXR Left central venous catheter with tip over the cavoatrial junction. No pneumothorax. Enteric tube tip remains at the level of the EG junction. Cardiac enlargement. Bilateral perihilar infiltrates  1/6 Ab portable Enteric tube tip projects over the EG junction with proximal side hole probably in the lower esophagus.  1/6 echo EF 15-20%, diffuse HK, RV mod dilated, RA mild dilated  1/7 EEG borderline no seizures noted   1/7 CXR improving b/l infiltrates progress right lower lobe/segmental collapse   1/8 Persistent bilateral pulmonary infiltrates consistent pneumonia, partial clearing of right base atelectasis. Stable cardiomegaly.  1/9 CXR b/l infrahilar opacities worrisome for multifocal infection  SIGNIFICANT EVENTS: 1/6>>VF arrest  1/6>>VT 8:33 shocked x 1, VT 8:35 shocked again and again at 10:13 w/ Amio bolus x 2, VT shock  again at 11:43 with another amio bolus  1/7>>? Seizure like activity 1/8>>extubated  1/9>>brady, CHB noted Zoll pads on given Atropine 0.4 x 1    SUBJECTIVE:  Bradycardic CHB noted.  Pt c/o hiccups unable to sleep Ativan, Baclofen, narcotics not helping.  Denies CP, SOB, +cough but not coughing up anything.     VITAL SIGNS: Temp:  [97.5 F (36.4 C)-98.7 F (37.1 C)] 98 F (36.7 C) (01/10 0000) Pulse Rate:  [31-47] 39 (01/10 0500) Resp:  [7-25] 18 (01/10 0500) BP: (90-140)/(52-81) 90/53 mmHg (01/10 0500) SpO2:  [85 %-100 %] 96 % (01/10 0500) HEMODYNAMICS: CVP:  [6 mmHg] 6 mmHg VENTILATOR SETTINGS:   INTAKE / OUTPUT:  Intake/Output Summary (Last 24 hours) at 03/16/14 0719 Last data filed at 03/16/14 0400  Gross per 24 hour  Intake 694.73 ml  Output   1725 ml  Net -1030.27 ml    PHYSICAL EXAMINATION: General:  Resting in recliner, nad Neuro: awake, alert oriented x 3, difficult to think of what he is trying to say HEENT:  [place]/at, Cardiovascular:  Bradycardic upper 30s Lungs:  ctab Abdomen:  Soft, nl bs, ntnd Musculoskeletal:  Nl muscle tone Skin:  Intact   LABS:  CBC  Recent Labs Lab 03/13/14 0412 03/14/14 0500 03/15/14 0330  WBC 17.6* 22.4* 14.1*  HGB 15.7 15.1 13.3  HCT 44.6 42.5 38.8*  PLT 173 169 131*   Coag's  Recent Labs Lab 03/12/14 0300 03/12/14 1100  APTT 34 41*  INR 1.17 1.25   BMET  Recent Labs Lab 03/14/14 0500 03/15/14 0330 03/16/14 0500  NA 130*  139 138  K 4.8 4.6 5.0  CL 97 95* 103  CO2 BUN 35* 39* 36*  CREATININE 1.74* 1.70* 1.29  GLUCOSE 143* 134* 135*   Electrolytes  Recent Labs Lab 03/13/14 0507  03/14/14 0500 03/15/14 0330 03/16/14 0500  CALCIUM 8.5  < > 8.5 8.0* 8.8  MG 1.7  --   --  1.9 2.2  PHOS  --   --   --   --  3.3  < > = values in this interval not displayed. Sepsis Markers  Recent Labs Lab 03/11/14 2354 03/12/14 0415 03/13/14 0413  LATICACIDVEN 4.33* 2.6* 6.5*   ABG  Recent  Labs Lab 03/12/14 0540 03/13/14 0423 03/14/14 0500  PHART 7.449 7.216* 7.427  PCO2ART 31.5* 43.8 32.4*  PO2ART 222.0* 101.0* 170.0*   Liver Enzymes  Recent Labs Lab 03/13/14 0507 03/14/14 0500  AST 47* 30  ALT 66* 41  ALKPHOS 76 54  BILITOT 0.9 1.3*  ALBUMIN 3.4* 2.9*   Cardiac Enzymes  Recent Labs Lab 03/12/14 0401 03/12/14 1001 03/15/14 0330  TROPONINI 0.44* 0.58* 0.09*   Glucose  Recent Labs Lab 03/14/14 0724 03/14/14 1111 03/14/14 1637 03/14/14 2350 03/15/14 0724 03/15/14 1119  GLUCAP 127* 108* 119* 142* 155* 140*    Imaging Dg Chest Port 1 View  03/15/2014   CLINICAL DATA:  Ventricular tachycardia. History of sarcoid and GERD.  EXAM: PORTABLE CHEST - 1 VIEW  COMPARISON:  03/14/2014; 03/13/2014; 03/11/2014; chest CT - 11/29/2011  FINDINGS: Grossly unchanged enlarged cardiac silhouette and mediastinal contours. Interval extubation and removal of enteric tube. Otherwise, stable position of remaining support apparatus. No pneumothorax. Continued improved aeration of the lungs with persistent bibasilar heterogeneous airspace opacities. Trace bilateral effusions are not excluded. No definite evidence of edema. Unchanged bones.  IMPRESSION: 1. Interval extubation and removal of enteric tube. Otherwise, stable position of remaining support apparatus. No pneumothorax. 2. Improved aeration of lungs with persistent bilateral infrahilar opacities again worrisome for multifocal infection. Continued attention on follow-up is recommended.   Electronically Signed   By: Simonne Come M.D.   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 He will likely need 24 months of prednisone with a slow taper to a maintenance dose.  Consider advanced CHF per cards  Consider removal of CVL today Monitor electrolytes goal Mag >1.9 K >3.9  RENAL A:   Non AG Metabolic acidosis/Metabolic Alkalosis/Respiratory Acidosis, resolved  Hypokalemia resolved  Hyponatremia resolved   P:   Strict i/o daily weights  BMP am   GASTROINTESTINAL A:   Post intubation dysphagia  Nausea H/o GERD  P:   dys 3 diet nectar thick  Prn Zofran  Protonix 40 qd   HEMATOLOGIC A:   DVT px  Thrombocytopenia   P:  Scds, heparin sq  If plts keep trending down consider d/c Hep sq   INFECTIOUS A:   Leukocytosis likely 2/2 steroids   P:  BCx2  Not obtained UC not obtained Sputum Moderate gram neg rods  MRSA neg  Abx: none  Trend CBC Follow off abx. Low threshold to start if clinical change  ENDOCRINE A:   Hyperglycemia  P:   HA1C 5.5  consider SSI if still elevated significantly will hold for now   NEUROLOGIC A:   Encephalopathy, resolved  Concern for seizure activity upon rewarming 1/7. 1/7 Borderline EEG no seizure activity noted  Hiccups  P:   d/c'ed Keppra and follow  Continue Baclofen tid prn, Fentanyl 12.5-25 q2 prn, Norco/Vicodin, Ativan 2 mg q2 prn. If hiccups still persist consider other medications i.e. Reglan/Thorazine but beware of side effects. Pt could try Valsalva as well. Will monitor hiccups for now.     FAMILY  - Updates: brother in law at bedside 1/10 - Inter-disciplinary family meet or Palliative Care meeting due by:  03/19/14    Attending Note:  I have examined patient, reviewed labs, studies and notes. I have discussed the case with Dr Shirlee Latch, and I agree with the data and plans as  amended above. He is recovering post arrest. Would optimally have a cardiac MRI tomorrow before his AICD is placed to assess further for cardiac sarcoidosis. Will move him to a SDU bed today. I reviewed the plans with Dr Johney Frame.    Levy Pupa, MD, PhD 03/16/2014, 3:29 PM [facility] [phone] or if no answer [phone]

## 2014-03-17 ENCOUNTER — Inpatient Hospital Stay (HOSPITAL_COMMUNITY): Payer: 59

## 2014-03-17 ENCOUNTER — Encounter (HOSPITAL_COMMUNITY)
Admission: EM | Disposition: A | Discharge status: Home / Self Care | Payer: Self-pay | Source: Home / Self Care | Attending: Pulmonary Disease

## 2014-03-17 DIAGNOSIS — I472 Ventricular tachycardia: Secondary | ICD-10-CM

## 2014-03-17 DIAGNOSIS — I5022 Chronic systolic (congestive) heart failure: Secondary | ICD-10-CM

## 2014-03-17 DIAGNOSIS — I429 Cardiomyopathy, unspecified: Secondary | ICD-10-CM

## 2014-03-17 HISTORY — PX: BI-VENTRICULAR IMPLANTABLE CARDIOVERTER DEFIBRILLATOR: SHX5459

## 2014-03-17 LAB — CBC WITH DIFFERENTIAL/PLATELET
Basophils Absolute: 0 10*3/uL (ref 0.0–0.1)
Basophils Relative: 0 % (ref 0–1)
Eosinophils Absolute: 0 10*3/uL (ref 0.0–0.7)
Eosinophils Relative: 0 % (ref 0–5)
HEMATOCRIT: 41 % (ref 39.0–52.0)
Hemoglobin: 13.9 g/dL (ref 13.0–17.0)
Lymphocytes Relative: 4 % — ABNORMAL LOW (ref 12–46)
Lymphs Abs: 0.5 10*3/uL — ABNORMAL LOW (ref 0.7–4.0)
MCH: 29.9 pg (ref 26.0–34.0)
MCHC: 33.9 g/dL (ref 30.0–36.0)
MCV: 88.2 fL (ref 78.0–100.0)
MONOS PCT: 6 % (ref 3–12)
Monocytes Absolute: 0.8 10*3/uL (ref 0.1–1.0)
Neutro Abs: 10.9 10*3/uL — ABNORMAL HIGH (ref 1.7–7.7)
Neutrophils Relative %: 90 % — ABNORMAL HIGH (ref 43–77)
Platelets: 120 10*3/uL — ABNORMAL LOW (ref 150–400)
RBC: 4.65 MIL/uL (ref 4.22–5.81)
RDW: 13.4 % (ref 11.5–15.5)
WBC: 12.2 10*3/uL — AB (ref 4.0–10.5)

## 2014-03-17 LAB — BASIC METABOLIC PANEL
Anion gap: 5 (ref 5–15)
BUN: 39 mg/dL — ABNORMAL HIGH (ref 6–23)
CO2: 33 mmol/L — AB (ref 19–32)
Calcium: 8.9 mg/dL (ref 8.4–10.5)
Chloride: 101 mEq/L (ref 96–112)
Creatinine, Ser: 1.39 mg/dL — ABNORMAL HIGH (ref 0.50–1.35)
GFR calc Af Amer: 67 mL/min — ABNORMAL LOW (ref >90)
GFR calc non Af Amer: 58 mL/min — ABNORMAL LOW (ref >90)
GLUCOSE: 124 mg/dL — AB (ref 70–99)
Potassium: 4.3 mmol/L (ref 3.5–5.1)
Sodium: 139 mmol/L (ref 135–145)

## 2014-03-17 LAB — GLUCOSE, CAPILLARY
GLUCOSE-CAPILLARY: 107 mg/dL — AB (ref 70–99)
Glucose-Capillary: 79 mg/dL (ref 70–99)

## 2014-03-17 LAB — MAGNESIUM: MAGNESIUM: 2.2 mg/dL (ref 1.5–2.5)

## 2014-03-17 SURGERY — BI-VENTRICULAR IMPLANTABLE CARDIOVERTER DEFIBRILLATOR  (CRT-D)
Anesthesia: LOCAL

## 2014-03-17 MED ORDER — MIDAZOLAM HCL 5 MG/5ML IJ SOLN
INTRAMUSCULAR | Status: AC
  Filled 2014-03-17: qty 5

## 2014-03-17 MED ORDER — FENTANYL CITRATE 0.05 MG/ML IJ SOLN
INTRAMUSCULAR | Status: AC
  Filled 2014-03-17: qty 2

## 2014-03-17 MED ORDER — CEFAZOLIN SODIUM 1-5 GM-% IV SOLN
1.0000 g | Freq: Four times a day (QID) | INTRAVENOUS | Status: AC
  Administered 2014-03-17 – 2014-03-18 (×3): 1 g via INTRAVENOUS
  Filled 2014-03-17 (×4): qty 50

## 2014-03-17 MED ORDER — GADOBENATE DIMEGLUMINE 529 MG/ML IV SOLN
30.0000 mL | Freq: Once | INTRAVENOUS | Status: AC
  Administered 2014-03-17: 30 mL via INTRAVENOUS

## 2014-03-17 MED ORDER — INSULIN ASPART 100 UNIT/ML ~~LOC~~ SOLN: 0.0000 [IU] | Freq: Three times a day (TID) | SUBCUTANEOUS | Status: DC

## 2014-03-17 MED ORDER — ONDANSETRON HCL 4 MG/2ML IJ SOLN: 4.0000 mg | Freq: Four times a day (QID) | INTRAMUSCULAR | Status: DC | PRN

## 2014-03-17 MED ORDER — ACETAMINOPHEN 325 MG PO TABS: 325.0000 mg | ORAL_TABLET | ORAL | Status: DC | PRN

## 2014-03-17 MED ORDER — CHLORPROMAZINE HCL 25 MG/ML IJ SOLN
50.0000 mg | Freq: Once | INTRAMUSCULAR | Status: AC
  Administered 2014-03-17: 50 mg via INTRAMUSCULAR
  Filled 2014-03-17 (×2): qty 2

## 2014-03-17 MED ORDER — FENTANYL CITRATE 0.05 MG/ML IJ SOLN
25.0000 ug | INTRAMUSCULAR | Status: DC | PRN
  Administered 2014-03-17: 25 ug via INTRAVENOUS
  Filled 2014-03-17: qty 2

## 2014-03-17 MED ORDER — LIDOCAINE HCL (PF) 1 % IJ SOLN
INTRAMUSCULAR | Status: AC
  Filled 2014-03-17: qty 60

## 2014-03-17 NOTE — H&P (Signed)
  ICD Criteria  Current LVEF:40% ;Obtained < 1 month ago.  NYHA Functional Classification: Class I  Heart Failure History:  No.  Non-Ischemic Dilated Cardiomyopathy History:  Yes, timeframe is < 3 months  Atrial Fibrillation/Atrial Flutter:  No.  Ventricular Tachycardia History:  Yes, Hemodynamic instability present, VT Type:  SVT - Monomorphic.  Cardiac Arrest History:  Yes, This was a Ventricular Tachycardia/Ventricular Fibrillation Arrest. This was NOT a bradycardia arrest.  History of Syndromes with Risk of Sudden Death:  No.  Previous ICD:  No.  Electrophysiology Study: No.  Prior MI: No.  PPM: No.  OSA:  No  Patient Life Expectancy of >=1 year: Yes.  Anticoagulation Therapy:  Patient is NOT on anticoagulation therapy.   Beta Blocker Therapy:  No, heart block  Ace Inhibitor/ARB Therapy:  No, Reason not on Ace Inhibitor/ARB therapy:  hypotension

## 2014-03-17 NOTE — CV Procedure (Signed)
SURGEON:  Lewayne Bunting, MD      PREPROCEDURE DIAGNOSES:   1. Nonischemic cardiomyopathy.   2. New York Heart Association class I  3. Complete heart block 4. Sustained hemodynamically unstable ventricular tachycardia 5. Rescucitated Ventricular fibrillation cardiac arrest, not in the setting of an acute MI     POSTPROCEDURE DIAGNOSES:  Same as preprocedure diagnosis    PROCEDURES:    1. Biventricular ICD implantation.  2. Defibrillation threshold testing 3. Venography of the coronary sinus     INTRODUCTION:  Christopher Patrick is a 49 y.o. male with a nonischemic CM (EF 40%), NYHA Class I CHF, and complete heart block. At this time, he meets criteria for ICD implantation for secondary prevention of sudden death.  He presented with a VF arrest. Given compete heart block, the patient may also be expected to benefit from resynchronization therapy. The etiology of his heart block and VT/VF arrest is thought due to sarcoidosis involving his heart and cardiac MRI supports the diagnosis.       DESCRIPTION OF PROCEDURE:  Informed written consent was obtained and the   patient was brought to the electrophysiology lab in the fasting state. The patient was adequately sedated with intravenous Versed and Fentanyl as outlined in the nursing report.  The patient's left chest was prepped and draped in the usual sterile fashion by the EP lab staff.  The skin overlying the left deltopectoral region was infiltrated with lidocaine for local analgesia.  A 6-cm incision was made over the left deltopectoral region.  A left subcutaneous defibrillator pocket was fashioned using a combination of sharp and blunt dissection.  Electrocautery was used to assure hemostasis.     RA/RV Lead Placement: The left axillary vein was cannulated with fluoroscopic visualization.  No contrast was required for this endeavor.  Through the left axillary vein, a St. Jude [id number] (serial # [id number]  ) right atrial lead and a [employer] (serial number [id number]) [employer] [place] right ventricular defibrillator lead were advanced with fluoroscopic visualization into the right atrial appendage and right ventricular apical septal positions respectively.  Initial atrial lead P-waves measured 1.1 mV with an impedance of 474 ohms and a threshold of 0.6 volts at 0.5 milliseconds.  The right ventricular lead R-wave measured 22 mV with impedance of 800 ohms and a threshold of 0.7 volts at 0.5 milliseconds.   LV Lead Placement:  A St. Jude guide was advanced through the left axillary vein into the low lateral right atrium. A 6 french hexapolar EP catheter was introduced through the [place]. Jude guide and used to cannulate the coronary sinus. A coronary sinus nonselective venogram was performed by hand injection of nonionic contrast. This demonstrated a large posterolateral vein.  A 0.014 angioplasty guide wire was introduced through the St. Jude Guide and advanced into the posterolateral vein. A St. Jude(serial number [id number]) lead was advanced through the posterior vein. This was approximately two-thirds from the base to the apex in a very lateral position. In this location with 3-4 bipolar configuration, the left ventricular lead R-waves measured 21 mV with impedance of 940 ohms and a threshold of 1.1 volt at 0.5 Milliseconds with no diaphragmatic stimulation observed when pacing at 10 volts output. The St. Jude guide was therefore removed.  All three leads were secured to the pectoralis fascia using #2 silk suture over the suture sleeves. The pocket then irrigated with copious gentamicin solution.   Device Placement: The leads were then  connected to a St. Jude (serial  Number Assura) biventricular ICD.  The defibrillator was placed into the  pocket.  The pocket was then closed in 2 layers with 2.0 Vicryl suture  for the subcutaneous and subcuticular layers.  Steri-Strips and a  sterile dressing were then applied.   DFT  Testing: Defibrillation Threshold testing was then performed. Ventricular fibrillation was induced with a T shock.  Adequate sensing of ventricular  fibrillation was observed with minimal dropout with a programmed sensitivity of 1.38mV.  The patient was successfully defibrillated to sinus rhythm with a single 20 joules shock delivered from the device with an impedance of 63 ohms in a duration of 4.5 seconds.  The patient remained in sinus rhythm thereafter.  There were no early apparent complications.      CONCLUSIONS:   1. Nonischemic cardiomyopathy with Left bundle-branch block and chronic New York Heart Association class III heart failure.   2. Successful biventricular ICD implantation.   3. DFT less than or equal to 15 joules.   4. No inducible VT or VF with PES  5. No early apparent complications.   Lewayne Bunting, MD  6:18 PM 03/17/2014

## 2014-03-17 NOTE — Progress Notes (Signed)
Patient Name: Christopher Patrick Date of Encounter: 03/17/2014  Principal Problem:   Cardiac arrest Active Problems:   VT (ventricular tachycardia) - Multifocal monomorphic   Pulmonary sarcoidosis - by report   Right bundle branch block   Atrioventricular (AV) dissociation   Non-ischemic cardiomyopathy - EF ~15-20% by Echo   Shock circulatory   Acute respiratory failure with hypoxia   Acute encephalopathy   Sarcoidosis   Primary Cardiologist: Dr. Herbie Baltimore Electrophysiologist: Dr. Ladona Ridgel  Patient Profile: 49 yo male w/ hx sarcoid, GERD, was admitted 01/05 after cardiac arrest. Rapid pulse per bystander, then pulseless/apneic, CPR started. Duration 20 minutes with 3 shocks, 2 Epi, amio bolus. VT in ER, ECG with trifasicular block. S/p cooling protocol, continued on amio recurrent VT with 2 more shocks, was on lidocaine for a while. Extubated 01/08. EF 15-20% by echo, cath was clean. HR has been 30s-40s, CHB at times. Amiodarone d/c'd 01/10.  SUBJECTIVE: Feels like he is slowly improving. Has had hiccups most of the time x 4 days, they finally broke approx 2 hr ago. Feels much better now. Chest is sore, no SOB.   OBJECTIVE Filed Vitals:   03/17/14 0500 03/17/14 0600 03/17/14 0700 03/17/14 0800  BP: 118/55 104/60 120/66   Pulse: 39 28 39   Temp:    98.1 F (36.7 C)  TempSrc:    Oral  Resp: Height:      Weight:      SpO2: 95% 97% 95%     Intake/Output Summary (Last 24 hours) at 03/17/14 1121 Last data filed at 03/17/14 0900  Gross per 24 hour  Intake 322.84 ml  Output   1000 ml  Net -677.16 ml   Filed Weights   03/12/14 0300 03/13/14 0400 03/14/14 0330  Weight: 203 lb 11.3 oz (92.4 kg) 218 lb 0.6 oz (98.9 kg) 216 lb 4.7 oz (98.11 kg)   PHYSICAL EXAM General: Well developed, well nourished, male in no acute distress. Head: Normocephalic, atraumatic.  Neck: Supple without bruits, JVD not elevated. Lungs:  Resp regular and unlabored, CTA. Heart: RRR,  S1, S2, no S3, S4, or murmur; no rub. Abdomen: Soft, non-tender, non-distended, BS + x 4.  Extremities: No clubbing, cyanosis, no edema.  Neuro: Alert and oriented X 3. Moves all extremities spontaneously. Psych: Normal affect.  LABS: CBC:  Recent Labs  03/15/14 0330 03/17/14 0432  WBC 14.1* 12.2*  NEUTROABS 13.2* 10.9*  HGB 13.3 13.9  HCT 38.8* 41.0  MCV 89.2 88.2  PLT 131* 120*   Basic Metabolic Panel:  Recent Labs  96/04/54 0500 03/17/14 0432  NA 138 139  K 5.0 4.3  CL 103 101  CO2 30 33*  GLUCOSE 135* 124*  BUN 36* 39*  CREATININE 1.29 1.39*  CALCIUM 8.8 8.9  MG 2.2 2.2  PHOS 3.3  --    Cardiac Enzymes:  Recent Labs  03/15/14 0330  TROPONINI 0.09*   TELE:  CHB at times, o/w SR with 1st degree AVB.      Radiology/Studies: Mr Card Morphology Wo/w Cm 03/17/2014   CLINICAL DATA:  Nonischemic cardiomyopathy, assess for cardiac involvement by sarcoidosis.  EXAM: CARDIAC MRI  TECHNIQUE: The patient was scanned on a 1.5 Tesla GE magnet. A dedicated cardiac coil was used. Functional imaging was done using Fiesta sequences. 2,3, and 4 chamber views were done to assess for RWMA's. Modified Simpson's rule using a short axis stack was used to calculate an ejection fraction on a  dedicated work [employer]. The patient received 30 cc of Multihance. After 10 minutes inversion recovery sequences were used to assess for infiltration and scar tissue.  FINDINGS: There were trivial bilateral pleural effusions and a trivial pericardial effusion.  Normal left ventricular size and normal wall thickness. Mildly decreased systolic function, EF 46%. There was hypokinesis of the basal to mid anteroseptal wall and the apical inferior wall. The right ventricle was moderately dilated with mild to moderately decreased systolic function, EF 33%. The aortic valve was trileaflet with no significant stenosis or regurgitation. There appeared to be mild mitral regurgitation though flow  sequences to quantify were not done. There was mild biatrial enlargement.  On delayed enhancement imaging, there was patchy late gadolinium enhancement (LGE) primarily in the mid-wall of the basal to mid septum and anterior wall. There was an large nodular area of LGE in the mid anteroseptum that was near-full thickness. There was subendocardial LGE in the mid inferolateral wall and subepicardial LGE in the apical inferior wall. There also was an area of LGE in the RV free wall.  MEASUREMENTS: MEASUREMENTS LV EDV 227 mL  LV SV 104 mL  LV EF 46%  RV EDV 271 mL  RV SV 91 mL  RV EF 33%  IMPRESSION: 1. Normal LV size with EF 46%. Wall motion abnormalities as noted above.  2. Moderately dilated RV with mild to moderate systolic dysfunction, EF 33%.  3. Pattern of LGE as noted above. 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  Dalton Mclean   Electronically Signed   By: Marca Ancona M.D.   On: 03/17/2014 10:48     Current Medications:  . atropine  0.4 mg Intravenous Once  .  ceFAZolin (ANCEF) IV  2 g Intravenous On Call  . gentamicin irrigation  80 mg Irrigation On Call  . insulin aspart  0-9 Units Subcutaneous TID WC  . pantoprazole  40 mg Oral Daily  . predniSONE  60 mg Oral Q breakfast   . sodium chloride 10 mL/hr at 03/17/14 0700  . sodium chloride 50 mL/hr at 03/17/14 0700    ASSESSMENT AND PLAN: Principal Problem:   Cardiac arrest - pulseless VT  Active Problems:   VT (ventricular tachycardia) - Multifocal monomorphic - keep K+ 4.0, Mg 2.0, no CAD but has cardiac sarcoid by cardiac MRI.     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    Shock circulatory - improved, BP high now.    Acute respiratory failure with hypoxia -  improved, CCM following, no acute needs    Acute encephalopathy - follow, slow improvement    Sarcoidosis - pulmonary by history, cardiac by MRI, med changes per MD.   Melida Quitter , PA-C 11:21 AM 03/17/2014  EP Attending  Patient seen and examined. Discussed all issues above. He had intractable hiccups for 2 days which have resolved. He has had his cardiac MRI which demonstrated biventricular dysfunction including involvement of the anteroseptum as well as the RV. He has had persistent complete heart block alternating with 2:1 AV block. His sustained VT has quieted down with IV amiodarone. I have discussed the indications, risks/benefits/goals/expecatations of BiV ICD insertion with the patient and his  wife and family and they wish to proceed.  Leonia Reeves.D.

## 2014-03-17 NOTE — Progress Notes (Signed)
PULMONARY / CRITICAL CARE MEDICINE   Name: Christopher Patrick MRN: [id number] DOB: 10/07/1965    ADMISSION DATE:  03/11/2014 CONSULTATION DATE:  03/11/14   REFERRING MD :  ED Dr. Preston Fleeting  CHIEF COMPLAINT:  Cardiac arrest   INITIAL PRESENTATION: 48 PMH sarcoid, GERD presenting s/p cardiac arrest found at home by wife initially alert then progressed to AMS. Prior to this he had hand paresthesias and palpitations a day ago.  EMS arrived VT, VF arrest w/o pulse CPR initiated ~10:30 PM with shocked x 3, given Epi x 2 transported to [facility]. CPR x 15-20 min and amio150 bolus and started on Lidocaine drip. He was intubated in the ED and cooled in the unit.    STUDIES:  1/6 CT head>>Mild loss of the expected gray-white matter differentiation, this could be artifact or reflect very early global brain edema/ anoxia. Recommend followup. 1/6 echo EF 15-20%, diffuse HK, RV mod dilated, RA mild dilated 1/7 EEG borderline no seizures noted   SIGNIFICANT EVENTS: 1/6>>VF arrest  1/6>>VT 8:33 shocked x 1, VT 8:35 shocked again and again at 10:13 w/ Amio bolus x 2, VT shock again at 11:43 with another amio bolus  1/7>>? Seizure like activity 1/8>>extubated  1/9>>brady, CHB noted Zoll pads on given Atropine 0.4 x 1    SUBJECTIVE:  No more hiccups this am prior to cardiac MRI but hiccups came back after procedure but stopped after pt coughed up thick yellow sputum.  Mom and wife at bedside spent several min. 20-30 min. With Dr. Craige Cotta updating  VITAL SIGNS: Temp:  [97.8 F (36.6 C)-98.2 F (36.8 C)] 98.2 F (36.8 C) (01/11 0000) Pulse Rate:  [28-102] 39 (01/11 0700) Resp:  [8-24] 15 (01/11 0700) BP: (95-128)/(43-88) 120/66 mmHg (01/11 0700) SpO2:  [91 %-99 %] 95 % (01/11 0700) HEMODYNAMICS:   VENTILATOR SETTINGS:   INTAKE / OUTPUT:  Intake/Output Summary (Last 24 hours) at 03/17/14 0818 Last data filed at 03/17/14 0100  Gross per 24 hour  Intake    290 ml  Output   1000 ml  Net   -710 ml     PHYSICAL EXAMINATION: General:  Resting in bed, nad Neuro: awake, alert oriented x 3 HEENT:  [place]/at, Cardiovascular:  Bradycardic upper 30s-50s now 60s-80s Lungs:  Ctab, coughed up yellow sputum Abdomen:  Soft, nl bs, ntnd Musculoskeletal:  Nl muscle tone Skin:  Intact   LABS:  CBC  Recent Labs Lab 03/14/14 0500 03/15/14 0330 03/17/14 0432  WBC 22.4* 14.1* 12.2*  HGB 15.1 13.3 13.9  HCT 42.5 38.8* 41.0  PLT 169 131* 120*   Coag's  Recent Labs Lab 03/12/14 0300 03/12/14 1100  APTT 34 41*  INR 1.17 1.25   BMET  Recent Labs Lab 03/15/14 0330 03/16/14 0500 03/17/14 0432  NA 139 138 139  K 4.6 5.0 4.3  CL 95* 103 101  CO2 23 30 33*  BUN 39* 36* 39*  CREATININE 1.70* 1.29 1.39*  GLUCOSE 134* 135* 124*   Electrolytes  Recent Labs Lab 03/15/14 0330 03/16/14 0500 03/17/14 0432  CALCIUM 8.0* 8.8 8.9  MG 1.9 2.2 2.2  PHOS  --  3.3  --    Sepsis Markers  Recent Labs Lab 03/11/14 2354 03/12/14 0415 03/13/14 0413  LATICACIDVEN 4.33* 2.6* 6.5*   ABG  Recent Labs Lab 03/12/14 0540 03/13/14 0423 03/14/14 0500  PHART 7.449 7.216* 7.427  PCO2ART 31.5* 43.8 32.4*  PO2ART 222.0* 101.0* 170.0*   Liver Enzymes  Recent Labs Lab 03/13/14  0507 03/14/14 0500  AST 47* 30  ALT 66* 41  ALKPHOS 76 54  BILITOT 0.9 1.3*  ALBUMIN 3.4* 2.9*   Cardiac Enzymes  Recent Labs Lab 03/12/14 0401 03/12/14 1001 03/15/14 0330  TROPONINI 0.44* 0.58* 0.09*   Glucose  Recent Labs Lab 03/14/14 0724 03/14/14 1111 03/14/14 1637 03/14/14 2350 03/15/14 0724 03/15/14 1119  GLUCAP 127* 108* 119* 142* 155* 140*    Imaging No results found.   ASSESSMENT / PLAN:  PULMONARY OETT 1/6>>1/8 A: Acute respiratory failure, VDRF resolved, extubated on room air 1/9 CXR b/l infrahilar opacities worrisome for multifocal infection pulm sarcoid  resp cx + (see below)  P:   Pulm hygiene with flutter valve  Consider Chest CT in few days to eval. 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 He will likely need 24 months of prednisone with a slow taper to a maintenance dose.  Consider advanced CHF per cards  Monitor electrolytes goal Mag >1.9 K >3.9  RENAL A:   AKI  P:   NS 50 cc/hr Strict i/o daily weights  BMP am   GASTROINTESTINAL A:   Post intubation dysphagia  Nausea H/o GERD  P:   dys 3 diet nectar thick NPO for now for procedure  Prn Zofran  Protonix 40 qd   HEMATOLOGIC A:   DVT px  Thrombocytopenia downtrending  P:  Scds, heparin sq will d/c   INFECTIOUS A:   Leukocytosis likely 2/2 steroids   P:   BCx2  Not obtained UC not obtained Sputum + enterobactor cloacea, panteoa agglonerans moderate  MRSA neg  Abx: none  Trend CBC Consider abx. Low threshold to start if clinical change  ENDOCRINE A:   Hyperglycemia  P:   HA1C 5.5  Added SSI-S   NEUROLOGIC A:   Encephalopathy, resolved  Concern for seizure activity upon rewarming 1/7. 1/7 Borderline EEG no seizure activity noted. No further activity  Hiccups persistent x 48 hours no longer today 1/11 am but then returned P:   Off Keppra Continue Baclofen tid prn, Fentanyl 12.5-25 q2 prn, Norco/Vicodin, Ativan 2 mg q2 prn. Try Vasalva avoid other meds for now  FAMILY  - Updates: wife and mom updated by Dr. Craige Cotta 1/11 - Inter-disciplinary family meet or Palliative Care meeting due by:  03/19/14   Summary: cardiac MRI and BiV ICD today.  Thrombocytopenia d/c Hep sq today.  Respiratory status improved consider Chest CT in a few  days.    Desma Maxim MD  [phone]  03/17/2014, 8:18 AM  Reviewed above, and examined.  Had extensive conversation with pt's wife and mother.  He has hx of pulmonary sarcoid.  Now looks like he has VF 2nd to cardiac sarcoid.  He is on prednisone.  He will need CT chest and PFT at some point to further assess his pulmonary status.  He had seizure during rewarming process >> might need further neuro assessment for neurosarcoid.  He has hiccups, but chest exam otherwise unremarkable.  He has enterobactor cloacea, panteoa agglonerans in sputum >> not sure significance of these; will monitor off Abx.  Coralyn Helling, MD [facility] Pulmonary/Critical Care 03/17/2014, 2:14 PM Pager:  [phone] After 3pm call: [phone]

## 2014-03-18 ENCOUNTER — Inpatient Hospital Stay (HOSPITAL_COMMUNITY): Payer: 59

## 2014-03-18 ENCOUNTER — Encounter (HOSPITAL_COMMUNITY): Payer: Self-pay | Admitting: Internal Medicine

## 2014-03-18 DIAGNOSIS — D8685 Sarcoid myocarditis: Secondary | ICD-10-CM

## 2014-03-18 DIAGNOSIS — R066 Hiccough: Secondary | ICD-10-CM

## 2014-03-18 LAB — ANGIOTENSIN CONVERTING ENZYME: Angiotensin-Converting Enzyme: 44 U/L (ref 8–52)

## 2014-03-18 LAB — GLUCOSE, CAPILLARY: Glucose-Capillary: 95 mg/dL (ref 70–99)

## 2014-03-18 LAB — CBC WITH DIFFERENTIAL/PLATELET
BASOS ABS: 0 10*3/uL (ref 0.0–0.1)
BASOS PCT: 0 % (ref 0–1)
Eosinophils Absolute: 0 10*3/uL (ref 0.0–0.7)
Eosinophils Relative: 0 % (ref 0–5)
HCT: 43.8 % (ref 39.0–52.0)
Hemoglobin: 15.4 g/dL (ref 13.0–17.0)
LYMPHS PCT: 11 % — AB (ref 12–46)
Lymphs Abs: 0.8 10*3/uL (ref 0.7–4.0)
MCH: 30.7 pg (ref 26.0–34.0)
MCHC: 35.2 g/dL (ref 30.0–36.0)
MCV: 87.4 fL (ref 78.0–100.0)
MONO ABS: 0.2 10*3/uL (ref 0.1–1.0)
MONOS PCT: 3 % (ref 3–12)
NEUTROS PCT: 86 % — AB (ref 43–77)
Neutro Abs: 6.6 10*3/uL (ref 1.7–7.7)
Platelets: 107 10*3/uL — ABNORMAL LOW (ref 150–400)
RBC: 5.01 MIL/uL (ref 4.22–5.81)
RDW: 13.1 % (ref 11.5–15.5)
WBC: 7.7 10*3/uL (ref 4.0–10.5)

## 2014-03-18 LAB — BASIC METABOLIC PANEL
Anion gap: 13 (ref 5–15)
BUN: 31 mg/dL — AB (ref 6–23)
CHLORIDE: 102 meq/L (ref 96–112)
CO2: 24 mmol/L (ref 19–32)
Calcium: 8.5 mg/dL (ref 8.4–10.5)
Creatinine, Ser: 1.1 mg/dL (ref 0.50–1.35)
GFR calc Af Amer: 89 mL/min — ABNORMAL LOW (ref >90)
GFR calc non Af Amer: 77 mL/min — ABNORMAL LOW (ref >90)
Glucose, Bld: 135 mg/dL — ABNORMAL HIGH (ref 70–99)
Potassium: 4.1 mmol/L (ref 3.5–5.1)
SODIUM: 139 mmol/L (ref 135–145)

## 2014-03-18 LAB — MAGNESIUM: Magnesium: 2 mg/dL (ref 1.5–2.5)

## 2014-03-18 MED ORDER — HYDROCODONE-ACETAMINOPHEN 10-325 MG PO TABS: 1.0000 | ORAL_TABLET | ORAL | Status: DC | PRN

## 2014-03-18 MED ORDER — CARVEDILOL 3.125 MG PO TABS
3.1250 mg | ORAL_TABLET | Freq: Two times a day (BID) | ORAL | Status: DC
  Administered 2014-03-18 – 2014-03-20 (×5): 3.125 mg via ORAL
  Filled 2014-03-18 (×7): qty 1

## 2014-03-18 MED ORDER — CHLORPROMAZINE HCL 25 MG/ML IJ SOLN
50.0000 mg | Freq: Three times a day (TID) | INTRAMUSCULAR | Status: DC | PRN
  Filled 2014-03-18: qty 2

## 2014-03-18 MED ORDER — ACETAMINOPHEN 325 MG PO TABS: 650.0000 mg | ORAL_TABLET | Freq: Four times a day (QID) | ORAL | Status: DC | PRN

## 2014-03-18 MED ORDER — POLYETHYLENE GLYCOL 3350 17 G PO PACK
17.0000 g | PACK | Freq: Every day | ORAL | Status: DC
  Filled 2014-03-18 (×3): qty 1

## 2014-03-18 MED ORDER — AMIODARONE HCL 200 MG PO TABS
400.0000 mg | ORAL_TABLET | Freq: Two times a day (BID) | ORAL | Status: DC
  Administered 2014-03-18 – 2014-03-20 (×5): 400 mg via ORAL
  Filled 2014-03-18 (×7): qty 2

## 2014-03-18 MED ORDER — CHLORPROMAZINE HCL 25 MG/ML IJ SOLN
50.0000 mg | Freq: Once | INTRAMUSCULAR | Status: DC
  Administered 2014-03-18: 50 mg via INTRAMUSCULAR
  Filled 2014-03-18: qty 2

## 2014-03-18 MED ORDER — RAMIPRIL 2.5 MG PO CAPS
2.5000 mg | ORAL_CAPSULE | Freq: Every day | ORAL | Status: DC
  Administered 2014-03-18 – 2014-03-20 (×3): 2.5 mg via ORAL
  Filled 2014-03-18 (×3): qty 1

## 2014-03-18 MED ORDER — HYDROCODONE-ACETAMINOPHEN 10-325 MG PO TABS
1.0000 | ORAL_TABLET | Freq: Four times a day (QID) | ORAL | Status: DC | PRN
  Administered 2014-03-18: 1 via ORAL
  Filled 2014-03-18: qty 1

## 2014-03-18 MED ORDER — DOCUSATE SODIUM 100 MG PO CAPS
100.0000 mg | ORAL_CAPSULE | Freq: Every day | ORAL | Status: DC
  Administered 2014-03-18: 100 mg via ORAL
  Filled 2014-03-18 (×3): qty 1

## 2014-03-18 NOTE — Progress Notes (Signed)
Patient ID: WAYLIN DORKO, male   DOB: 1965-06-21, 49 y.o.   MRN: [id number]    Patient Name: Christopher Patrick Date of Encounter: 03/18/2014     Principal Problem:   Cardiac arrest Active Problems:   VT (ventricular tachycardia) - Multifocal monomorphic   Pulmonary sarcoidosis - by report   Right bundle branch block   Atrioventricular (AV) dissociation   Non-ischemic cardiomyopathy - EF ~15-20% by Echo   Shock circulatory   Acute respiratory failure with hypoxia   Acute encephalopathy   Sarcoidosis    SUBJECTIVE  Hiccups resolved earlier this morning. Chest pain gone.No pleuritic pain this morning. CXR pending  CURRENT MEDS . atropine  0.4 mg Intravenous Once  .  ceFAZolin (ANCEF) IV  1 g Intravenous Q6H  . chlorproMAZINE (THORAZINE) injection  50 mg Intramuscular Once  . insulin aspart  0-9 Units Subcutaneous TID WC  . pantoprazole  40 mg Oral Daily  . predniSONE  60 mg Oral Q breakfast    OBJECTIVE  Filed Vitals:   03/18/14 0300 03/18/14 0350 03/18/14 0400 03/18/14 0500  BP: 143/90  121/101 124/64  Pulse: 71  87 74  Temp:  98.5 F (36.9 C)    TempSrc:  Oral    Resp: Height:      Weight:      SpO2: 96%  95% 95%    Intake/Output Summary (Last 24 hours) at 03/18/14 0727 Last data filed at 03/18/14 0500  Gross per 24 hour  Intake   1110 ml  Output    950 ml  Net    160 ml   Filed Weights   03/12/14 0300 03/13/14 0400 03/14/14 0330  Weight: 203 lb 11.3 oz (92.4 kg) 218 lb 0.6 oz (98.9 kg) 216 lb 4.7 oz (98.11 kg)    PHYSICAL EXAM  General: Pleasant, NAD. Neuro: Alert and oriented X 3. Moves all extremities spontaneously. Psych: Normal affect. HEENT:  Normal  Neck: Supple without bruits or JVD. Lungs:  Resp regular and unlabored, CTA. Incision with no hematoma Heart: RRR no s3, s4, or murmurs. Abdomen: Soft, non-tender, non-distended, BS + x 4.  Extremities: No clubbing, cyanosis or edema. DP/PT/Radials 2+ and equal  bilaterally.  Accessory Clinical Findings  CBC  Recent Labs  03/17/14 0432 03/18/14 0325  WBC 12.2* 7.7  NEUTROABS 10.9* 6.6  HGB 13.9 15.4  HCT 41.0 43.8  MCV 88.2 87.4  PLT 120* 107*   Basic Metabolic Panel  Recent Labs  03/16/14 0500 03/17/14 0432 03/18/14 0325  NA 138 139 139  K 5.0 4.3 4.1  CL 103 101 102  CO2 30 33* 24  GLUCOSE 135* 124* 135*  BUN 36* 39* 31*  CREATININE 1.29 1.39* 1.10  CALCIUM 8.8 8.9 8.5  MG 2.2 2.2 2.0  PHOS 3.3  --   --    Liver Function Tests No results for input(s): AST, ALT, ALKPHOS, BILITOT, PROT, ALBUMIN in the last 72 hours. No results for input(s): LIPASE, AMYLASE in the last 72 hours. Cardiac Enzymes No results for input(s): CKTOTAL, CKMB, CKMBINDEX, TROPONINI in the last 72 hours. BNP Invalid input(s): POCBNP D-Dimer No results for input(s): DDIMER in the last 72 hours. Hemoglobin A1C No results for input(s): HGBA1C in the last 72 hours. Fasting Lipid Panel No results for input(s): CHOL, HDL, LDLCALC, TRIG, CHOLHDL, LDLDIRECT in the last 72 hours. Thyroid Function Tests No results for input(s): TSH, T4TOTAL, T3FREE, THYROIDAB in the last 72 hours.  Invalid input(s):  FREET3  TELE  NSR with biv pacing,  ectopy  Radiology/Studies  Dg Chest 1 View  03/13/2014   CLINICAL DATA:  Cardiac arrest.  Sarcoid.  EXAM: CHEST - 1 VIEW  COMPARISON:  03/12/2014  FINDINGS: Endotracheal tube in good position. Central venous catheter tip in the lower SVC. No pneumothorax. Gastric tube in the stomach.  Improvement in left upper lobe and left lower lobe airspace disease. Improvement in right perihilar infiltrate. Progression of right middle lobe collapse. No significant effusion.  IMPRESSION: Improving bilateral infiltrates. Progression of right middle lobe collapse.   Electronically Signed   By: Marlan Palau M.D.   On: 03/13/2014 07:27   Dg Chest Port 1 View  03/17/2014   CLINICAL DATA:  Post pacemaker insertion, history sarcoidosis   EXAM: PORTABLE CHEST - 1 VIEW  COMPARISON:  Portable exam 2021 hr compared to 03/15/2014  FINDINGS: LEFT subclavian transvenous pacemaker with leads projecting over RIGHT atrium, RIGHT ventricle and coronary sinus.  Enlargement of cardiac silhouette.  Mediastinal contour stable.  Medial RIGHT lung base infiltrate again seen.  Infiltrate versus atelectasis in retrocardiac LEFT lower lobe.  No gross pleural effusion or pneumothorax.  IMPRESSION: No pneumothorax post pacemaker insertion.  Otherwise no change.   Electronically Signed   By: Ulyses Southward M.D.   On: 03/17/2014 23:30   Dg Chest Port 1 View  03/15/2014   CLINICAL DATA:  Ventricular tachycardia. History of sarcoid and GERD.  EXAM: PORTABLE CHEST - 1 VIEW  COMPARISON:  03/14/2014; 03/13/2014; 03/11/2014; chest CT - 11/29/2011  FINDINGS: Grossly unchanged enlarged cardiac silhouette and mediastinal contours. Interval extubation and removal of enteric tube. Otherwise, stable position of remaining support apparatus. No pneumothorax. Continued improved aeration of the lungs with persistent bibasilar heterogeneous airspace opacities. Trace bilateral effusions are not excluded. No definite evidence of edema. Unchanged bones.  IMPRESSION: 1. Interval extubation and removal of enteric tube. Otherwise, stable position of remaining support apparatus. No pneumothorax. 2. Improved aeration of lungs with persistent bilateral infrahilar opacities again worrisome for multifocal infection. Continued attention on follow-up is recommended.   Electronically Signed   By: Simonne Come M.D.   On: 03/15/2014 08:41   Dg Chest Port 1 View  03/14/2014   CLINICAL DATA:  Respiratory failure.  EXAM: PORTABLE CHEST - 1 VIEW  COMPARISON:  03/13/2014.  FINDINGS: Endotracheal tube, NG tube, left of the stent position. Persistent bilateral pulmonary infiltrates consistent with pneumonia. Interim partial clearing of right base atelectasis. No pleural effusion or pneumothorax. Stable  cardiomegaly with normal pulmonary vascularity. Degenerative changes thoracic spine.  IMPRESSION: 1. Persistent bilateral pulmonary infiltrates consistent pneumonia. 2. Interim partial clearing of right base atelectasis. 3. Stable cardiomegaly.   Electronically Signed   By: Maisie Fus  Register   On: 03/14/2014 07:36   Dg Chest Port 1 View  03/12/2014   CLINICAL DATA:  Central line placement.  EXAM: PORTABLE CHEST - 1 VIEW  COMPARISON:  03/11/2014  FINDINGS: Endotracheal tube tip measures 5.3 cm above the carinal. Enteric tube tip projects over the EG junction. Interval placement of a left central venous catheter with tip over the cavoatrial junction. No pneumothorax. Persistent cardiac enlargement and perihilar infiltrates.  IMPRESSION: Left central venous catheter with tip over the cavoatrial junction. No pneumothorax. Enteric tube tip remains at the level of the EG junction. Cardiac enlargement. Bilateral perihilar infiltrates.   Electronically Signed   By: Burman Nieves M.D.   On: 03/12/2014 03:23   Dg Chest Port 1 View  03/12/2014  CLINICAL DATA:  Post CPR to assess endotracheal tube.  EXAM: PORTABLE CHEST - 1 VIEW  COMPARISON:  None.  FINDINGS: Two films were obtained, initial image demonstrating endotracheal tube tip 9 cm above the carina and second image demonstrating endotracheal tube tip 3.9 cm above the carina. Enteric tube tip is at the level of the EG junction with proximal side hole probably in the lower esophagus. Shallow inspiration. Cardiac enlargement with normal pulmonary vascularity. Focal opacity in the left mid lung consistent with infiltration or atelectasis. No blunting of costophrenic angles. No pneumothorax. Nodule in the right mid lung probably represents a calcified granuloma.  IMPRESSION: Endotracheal tube appears to be in good position. Enteric tube is somewhat shallow with proximal side hole probably in the distal esophageal region. Cardiac enlargement. Infiltration or atelectasis  in the left mid lung.   Electronically Signed   By: Burman Nieves M.D.   On: 03/12/2014 00:13   Dg Abd Portable 1v  03/12/2014   CLINICAL DATA:  OG placement.  EXAM: PORTABLE ABDOMEN - 1 VIEW  COMPARISON:  None.  FINDINGS: Enteric tube tip projects over the EG junction region with proximal side hole projecting over the lower esophagus. Cardiac enlargement. Infiltrates noted in the lower lungs.  IMPRESSION: Enteric tube tip projects over the EG junction with proximal side hole probably in the lower esophagus.   Electronically Signed   By: Burman Nieves M.D.   On: 03/12/2014 03:25   Mr Card Morphology Wo/w Cm  03/17/2014   CLINICAL DATA:  Nonischemic cardiomyopathy, assess for cardiac involvement by sarcoidosis.  EXAM: CARDIAC MRI  TECHNIQUE: The patient was scanned on a 1.5 Tesla GE magnet. A dedicated cardiac coil was used. Functional imaging was done using Fiesta sequences. 2,3, and 4 chamber views were done to assess for RWMA's. Modified Simpson's rule using a short axis stack was used to calculate an ejection fraction on a dedicated work [employer]. The patient received 30 cc of Multihance. After 10 minutes inversion recovery sequences were used to assess for infiltration and scar tissue.  FINDINGS: There were trivial bilateral pleural effusions and a trivial pericardial effusion.  Normal left ventricular size and normal wall thickness. Mildly decreased systolic function, EF 46%. There was hypokinesis of the basal to mid anteroseptal wall and the apical inferior wall. The right ventricle was moderately dilated with mild to moderately decreased systolic function, EF 33%. The aortic valve was trileaflet with no significant stenosis or regurgitation. There appeared to be mild mitral regurgitation though flow sequences to quantify were not done. There was mild biatrial enlargement.  On delayed enhancement imaging, there was patchy late gadolinium enhancement (LGE) primarily in the  mid-wall of the basal to mid septum and anterior wall. There was an large nodular area of LGE in the mid anteroseptum that was near-full thickness. There was subendocardial LGE in the mid inferolateral wall and subepicardial LGE in the apical inferior wall. There also was an area of LGE in the RV free wall.  MEASUREMENTS: MEASUREMENTS LV EDV 227 mL  LV SV 104 mL  LV EF 46%  RV EDV 271 mL  RV SV 91 mL  RV EF 33%  IMPRESSION: 1. Normal LV size with EF 46%. Wall motion abnormalities as noted above.  2. Moderately dilated RV with mild to moderate systolic dysfunction, EF 33%.  3. Pattern of LGE as noted above. 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  [employer]   Electronically Signed   By: Marca Ancona  M.D.   On: 03/17/2014 10:48   Ct Portable Head W/o Cm  03/12/2014   CLINICAL DATA:  Status post CPR, cardiac arrest. Acute encephalopathy.  EXAM: CT HEAD WITHOUT CONTRAST  TECHNIQUE: Contiguous axial images were obtained from the base of the skull through the vertex without intravenous contrast.  COMPARISON:  None.  FINDINGS: The ventricles and sulci are normal. Somewhat poor gray-white matter differentiation though, there is streak artifact from life support lines. No intraparenchymal hemorrhage, mass effect nor midline shift. No acute large vascular territory infarcts.  No abnormal extra-axial fluid collections. Basal cisterns are patent.  No skull fracture. The included ocular globes and orbital contents are non-suspicious. Bilateral maxillary mucosal retention cyst, moderate paranasal sinusitis. Mastoid air cells are well aerated.  IMPRESSION: Mild loss of the expected gray-white matter differentiation, this could be artifact or reflect very early global brain edema/ anoxia. Recommend followup.   Electronically Signed   By: Awilda Metro   On: 03/12/2014 06:00    ICD interogation - device reprogrammed to optimize capture. No diaphragmatic stimulation.  ASSESSMENT AND PLAN  1. VT storm - restart  amio 2. Complete heart block 3. Cardiac sarcoid based on MRI and clinical presentation - started on prednisone per pulmonary/ccm 4. Class 1 systolic heart failure, EF 20-40% - will discharge on ACE/beta blockers 5. Intractable hiccups - continue Thorazine, narcotics as needed for pain control 6. S/p BiV ICD - await cxr. His lead dislodged on me twice prior to advancing into a more distal location. Thresholds satisfactory. 7. Rehab - will ask OT to see. I do not want him to use his left arm in any vigorous way.  Gregg Taylor,M.D.  03/18/2014 7:27 AM

## 2014-03-18 NOTE — Progress Notes (Signed)
PULMONARY / CRITICAL CARE MEDICINE   Name: Christopher Patrick MRN: [id number] DOB: 06/11/1965    ADMISSION DATE:  03/11/2014 CONSULTATION DATE:  03/11/2014   REFERRING MD :  ED Dr. Preston Fleeting  CHIEF COMPLAINT:  Cardiac arrest   INITIAL PRESENTATION: 49 yo male with pulseless VT cardiac arrest .  Received 15 min CPR before ROSC.  He has hx of pulmonary sarcoidosis.   STUDIES:  1/06 Echo >> EF 15 to 20% 1/06 Lt heart cath >> normal coronaries, severe LV systolic dysfx 1/07 EEG >> generalized low voltage slowing  SIGNIFICANT EVENTS: 1/05 Admit, cardiology consulted, hypothermia protocol 1/06 EP cardiology consulted, start solumedrol 1/07 Neurology consulted for ?seizure >> keppra started 1/09 Complete heart block; speech assessment >> D3 diet 1/11 BiV ICD inserted; transfer to SDU 1/12 Speech >> regular diet  SUBJECTIVE:  Develop hiccups again this AM after eating breakfast.  VITAL SIGNS: Temp:  [97.7 F (36.5 C)-99.1 F (37.3 C)] 98.2 F (36.8 C) (01/12 0800) Pulse Rate:  [35-87] 73 (01/12 0800) Resp:  [0-27] 15 (01/12 0800) BP: (110-146)/(64-101) 113/73 mmHg (01/12 0800) SpO2:  [80 %-96 %] 94 % (01/12 0800) Weight:  [208 lb 9.6 oz (94.62 kg)] 208 lb 9.6 oz (94.62 kg) (01/12 0800) INTAKE / OUTPUT:  Intake/Output Summary (Last 24 hours) at 03/18/14 0844 Last data filed at 03/18/14 0500  Gross per 24 hour  Intake   1150 ml  Output    950 ml  Net    200 ml    PHYSICAL EXAMINATION: General: in bed, hiccups Neuro: normal strength, some difficulty with short term memory HEENT: no sinus tenderness Cardiovascular: regular Lungs: no wheeze Abdomen: soft, non tender Musculoskeletal: no edema Skin: no rashes  LABS:  CBC  Recent Labs Lab 03/15/14 0330 03/17/14 0432 03/18/14 0325  WBC 14.1* 12.2* 7.7  HGB 13.3 13.9 15.4  HCT 38.8* 41.0 43.8  PLT 131* 120* 107*   Coag's  Recent Labs Lab 03/12/14 0300 03/12/14 1100  APTT 34 41*  INR 1.17 1.25    BMET  Recent Labs Lab 03/16/14 0500 03/17/14 0432 03/18/14 0325  NA 138 139 139  K 5.0 4.3 4.1  CL 103 101 102  CO2 30 33* 24  BUN 36* 39* 31*  CREATININE 1.29 1.39* 1.10  GLUCOSE 135* 124* 135*   Electrolytes  Recent Labs Lab 03/16/14 0500 03/17/14 0432 03/18/14 0325  CALCIUM 8.8 8.9 8.5  MG 2.2 2.2 2.0  PHOS 3.3  --   --    Sepsis Markers  Recent Labs Lab 03/11/14 2354 03/12/14 0415 03/13/14 0413  LATICACIDVEN 4.33* 2.6* 6.5*   ABG  Recent Labs Lab 03/12/14 0540 03/13/14 0423 03/14/14 0500  PHART 7.449 7.216* 7.427  PCO2ART 31.5* 43.8 32.4*  PO2ART 222.0* 101.0* 170.0*   Liver Enzymes  Recent Labs Lab 03/13/14 0507 03/14/14 0500  AST 47* 30  ALT 66* 41  ALKPHOS 76 54  BILITOT 0.9 1.3*  ALBUMIN 3.4* 2.9*   Cardiac Enzymes  Recent Labs Lab 03/12/14 0401 03/12/14 1001 03/15/14 0330  TROPONINI 0.44* 0.58* 0.09*   Glucose  Recent Labs Lab 03/14/14 2350 03/15/14 0724 03/15/14 1119 03/17/14 1150 03/17/14 1858 03/18/14 0757  GLUCAP 142* 155* 140* 79 107* 95    Imaging Dg Chest Port 1 View  03/17/2014   CLINICAL DATA:  Post pacemaker insertion, history sarcoidosis  EXAM: PORTABLE CHEST - 1 VIEW  COMPARISON:  Portable exam 2021 hr compared to 03/15/2014  FINDINGS: LEFT subclavian transvenous pacemaker with leads projecting  over RIGHT atrium, RIGHT ventricle and coronary sinus.  Enlargement of cardiac silhouette.  Mediastinal contour stable.  Medial RIGHT lung base infiltrate again seen.  Infiltrate versus atelectasis in retrocardiac LEFT lower lobe.  No gross pleural effusion or pneumothorax.  IMPRESSION: No pneumothorax post pacemaker insertion.  Otherwise no change.   Electronically Signed   By: Ulyses Southward M.D.   On: 03/17/2014 23:30   Mr Card Morphology Wo/w Cm  03/17/2014   CLINICAL DATA:  Nonischemic cardiomyopathy, assess for cardiac involvement by sarcoidosis.  EXAM: CARDIAC MRI  TECHNIQUE: The patient was scanned on a 1.5 Tesla  GE magnet. A dedicated cardiac coil was used. Functional imaging was done using Fiesta sequences. 2,3, and 4 chamber views were done to assess for RWMA's. Modified Simpson's rule using a short axis stack was used to calculate an ejection fraction on a dedicated work [employer]. The patient received 30 cc of Multihance. After 10 minutes inversion recovery sequences were used to assess for infiltration and scar tissue.  FINDINGS: There were trivial bilateral pleural effusions and a trivial pericardial effusion.  Normal left ventricular size and normal wall thickness. Mildly decreased systolic function, EF 46%. There was hypokinesis of the basal to mid anteroseptal wall and the apical inferior wall. The right ventricle was moderately dilated with mild to moderately decreased systolic function, EF 33%. The aortic valve was trileaflet with no significant stenosis or regurgitation. There appeared to be mild mitral regurgitation though flow sequences to quantify were not done. There was mild biatrial enlargement.  On delayed enhancement imaging, there was patchy late gadolinium enhancement (LGE) primarily in the mid-wall of the basal to mid septum and anterior wall. There was an large nodular area of LGE in the mid anteroseptum that was near-full thickness. There was subendocardial LGE in the mid inferolateral wall and subepicardial LGE in the apical inferior wall. There also was an area of LGE in the RV free wall.  MEASUREMENTS: MEASUREMENTS LV EDV 227 mL  LV SV 104 mL  LV EF 46%  RV EDV 271 mL  RV SV 91 mL  RV EF 33%  IMPRESSION: 1. Normal LV size with EF 46%. Wall motion abnormalities as noted above.  2. Moderately dilated RV with mild to moderate systolic dysfunction, EF 33%.  3. Pattern of LGE as noted above. 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  Dalton Mclean   Electronically Signed   By: Marca Ancona M.D.   On: 03/17/2014 10:48     ASSESSMENT / PLAN:  PULMONARY ETT 1/6 >>  1/8 A: Acute respiratory failure 2nd to cardiac arrest. Hx of pulmonary sarcoid >> followed by Pulmonary in [place]. P:   Will need f/u as outpt with pulmonary to arrange for CT chest and PFT's  CARDIOVASCULAR A:  Pulseless VT with cardiac arrest. Cardiac MRI consistent with cardiac sarcoid. Complete heart block. S/p BiV ICD 1/11. Non ischemic cardiomyopathy with acute systolic heart failure. Prolonged QTc. P:  Continue prednisone 60 mg daily Coreg, amiodarone, ramipril per cardiology  RENAL A:   AKI in setting of cardiac arrest >> resolved. P:   F/u BMET, Mg  GASTROINTESTINAL A:   Dysphagia >> resolved. Hiccups. Hx of GERD. Constipation. P:   Regular diet Protonix PRN thorazine Adjust bowel regimen  HEMATOLOGIC A:   Thrombocytopenia. P:  F/u CBC SCD for dvt prevention  INFECTIOUS A:   Enterobacter and [place] in sputum >> no clinical evidence for infection. P:   Monitor off Abx  ENDOCRINE A:   Steroid induced hyperglycemia >> improved, and not needing insulin. P:   D/c SSI  NEUROLOGIC A:   Acute encephalopathy 2nd to cardiac arrest >> improved. ?seizure after rewarming >> less likely; off AED's. Deconditioning. P:   Monitor mental status  PT assessment   Updated pt's family at bedside.  Hopefully ready for d/c home in next 24 to 48 hrs.  Coralyn Helling, MD [facility] Pulmonary/Critical Care 03/18/2014, 9:12 AM Pager:  [phone] After 3pm call: [phone]

## 2014-03-18 NOTE — Evaluation (Addendum)
Physical Therapy Evaluation Patient Details Name: Christopher Patrick MRN: [id number] DOB: [birth year] Today's Date: 03/18/2014   History of Present Illness  49 yo male w/ hx sarcoid, GERD, was admitted 01/05 after cardiac arrest, VT was recurrent. Pt underwent ICD placement 03/17/13.  Clinical Impression  Pt admitted with above diagnosis. Pt currently with functional limitations due to the deficits listed below (see PT Problem List).  Pt will benefit from skilled PT to increase their independence and safety with mobility to allow discharge to the venue listed below. Pt weak and unsteady after not having been OOB in 1 week. Needs to be up and mobilizing more prior to DC home. Evaluation interrupted by pt being taken to xray. Will attempt to see again this afternoon.     Follow Up Recommendations No PT follow up;Supervision/Assistance - 24 hour    Equipment Recommendations  None recommended by PT    Recommendations for Other Services       Precautions / Restrictions Precautions Precautions: Fall      Mobility  Bed Mobility Overal bed mobility: Needs Assistance Bed Mobility: Sit to Supine     Supine to sit: Min assist Sit to supine: Min assist   General bed mobility comments: Assist to bring trunk up and to not use LUE to push up with  Transfers Overall transfer level: Needs assistance Equipment used: None Transfers: Sit to/from Stand Sit to Stand: Min assist         General transfer comment: Assist for balance and to bring hips up.  Ambulation/Gait Ambulation/Gait assistance: Min assist Ambulation Distance (Feet): 80 Feet Assistive device:  (pushing IV pole) Gait Pattern/deviations: Step-through pattern;Decreased step length - right;Decreased step length - left;Staggering right   Gait velocity interpretation: Below normal speed for age/gender General Gait Details: Unsteady gait especially when distracted.  Stairs            Wheelchair Mobility     Modified Rankin (Stroke Patients Only)       Balance Overall balance assessment: Needs assistance Sitting-balance support: No upper extremity supported;Feet supported Sitting balance-Leahy Scale: Good     Standing balance support: No upper extremity supported Standing balance-Leahy Scale: Fair                               Pertinent Vitals/Pain Pain Assessment: No/denies pain    Home Living Family/patient expects to be discharged to:: Private residence Living Arrangements: Spouse/significant other Available Help at Discharge: Family Type of Home: House Home Access: Stairs to enter   Secretary/administrator of Steps: 2 Home Layout: One level Home Equipment: None      Prior Function Level of Independence: Independent         Comments: Works as Veterinary surgeon        Extremity/Trunk Assessment   Upper Extremity Assessment: LUE deficits/detail       LUE Deficits / Details: Pt with restrictions due to ICD placement   Lower Extremity Assessment: Generalized weakness         Communication   Communication: No difficulties  Cognition Arousal/Alertness: Awake/alert Behavior During Therapy: WFL for tasks assessed/performed Overall Cognitive Status: Impaired/Different from baseline Area of Impairment: Attention;Memory   Current Attention Level: Alternating Memory: Decreased short-term memory              General Comments      Exercises        Assessment/Plan  PT Assessment Patient needs continued PT services  PT Diagnosis Difficulty walking;Generalized weakness   PT Problem List Decreased strength;Decreased activity tolerance;Decreased balance;Decreased mobility;Decreased knowledge of precautions  PT Treatment Interventions DME instruction;Gait training;Functional mobility training;Stair training;Therapeutic activities;Therapeutic exercise;Patient/family education;Balance training   PT Goals (Current goals  can be found in the Care Plan section) Acute Rehab PT Goals Patient Stated Goal: Return home PT Goal Formulation: With patient Time For Goal Achievement: 03/25/14 Potential to Achieve Goals: Good    Frequency Min 3X/week   Barriers to discharge        Co-evaluation               End of Session   Activity Tolerance: Patient tolerated treatment well Patient left: in bed;with family/visitor present;with nursing/sitter in room Nurse Communication: Mobility status         Time: 8329-1916 PT Time Calculation (min) (ACUTE ONLY): 16 min   Charges:   PT Evaluation $Initial PT Evaluation Tier I: 1 Procedure PT Treatments $Gait Training: 8-22 mins   PT G Codes:        Camia Dipinto 29-Mar-2014, 12:27 PM  [facility] PT [phone]

## 2014-03-18 NOTE — Progress Notes (Signed)
Patient Name: Christopher Patrick Date of Encounter: 03/18/2014  Principal Problem:   Cardiac arrest Active Problems:   VT (ventricular tachycardia) - Multifocal monomorphic   Pulmonary sarcoidosis - by report   Right bundle branch block   Atrioventricular (AV) dissociation   Non-ischemic cardiomyopathy - EF ~15-20% by Echo   Shock circulatory   Acute respiratory failure with hypoxia   Acute encephalopathy   Sarcoidosis   Primary Cardiologist: Dr. Herbie Baltimore EP: Dr Ladona Ridgel  Patient Profile: 49 yo male w/ hx sarcoid, GERD, was admitted 01/05 after cardiac arrest, VT was recurrent, sarcoid on cardiac MRI.  SUBJECTIVE: Some pain at site, had hiccups x 5 hr last pm again. Otherwise doing well. Coughing up brown sputum occasionally  OBJECTIVE Filed Vitals:   03/18/14 0300 03/18/14 0350 03/18/14 0400 03/18/14 0500  BP: 143/90  121/101 124/64  Pulse: 71  87 74  Temp:  98.5 F (36.9 C)    TempSrc:  Oral    Resp: 13  18 19   Height:      Weight:      SpO2: 96%  95% 95%    Intake/Output Summary (Last 24 hours) at 03/18/14 0726 Last data filed at 03/18/14 0500  Gross per 24 hour  Intake   1110 ml  Output    950 ml  Net    160 ml   Filed Weights   03/12/14 0300 03/13/14 0400 03/14/14 0330  Weight: 203 lb 11.3 oz (92.4 kg) 218 lb 0.6 oz (98.9 kg) 216 lb 4.7 oz (98.11 kg)    PHYSICAL EXAM General: Well developed, well nourished, male in no acute distress. Head: Normocephalic, atraumatic.  Neck: Supple without bruits, JVD not elevated. Lungs:  Resp regular and unlabored, rales bases. Dressing in place, no hematoma Heart: RRR, S1, S2, no S3, S4, or murmur; no rub. Abdomen: Soft, non-tender, non-distended, BS + x 4.  Extremities: No clubbing, cyanosis, no edema.  Neuro: Alert and oriented X 3. Moves all extremities spontaneously. Psych: Normal affect.  LABS: CBC: Recent Labs  03/17/14 0432 03/18/14 0325  WBC 12.2* 7.7  NEUTROABS 10.9* 6.6  HGB 13.9 15.4  HCT  41.0 43.8  MCV 88.2 87.4  PLT 120* 107*   INR:No results for input(s): INR in the last 72 hours. Basic Metabolic Panel: Recent Labs  03/16/14 0500 03/17/14 0432 03/18/14 0325  NA 138 139 139  K 5.0 4.3 4.1  CL 103 101 102  CO2 30 33* 24  GLUCOSE 135* 124* 135*  BUN 36* 39* 31*  CREATININE 1.29 1.39* 1.10  CALCIUM 8.8 8.9 8.5  MG 2.2 2.2 2.0  PHOS 3.3  --   --    TELE: SR, AV pacing, V pacing  Dg Chest Port 1 View 03/17/2014   CLINICAL DATA:  Post pacemaker insertion, history sarcoidosis  EXAM: PORTABLE CHEST - 1 VIEW  COMPARISON:  Portable exam 2021 hr compared to 03/15/2014  FINDINGS: LEFT subclavian transvenous pacemaker with leads projecting over RIGHT atrium, RIGHT ventricle and coronary sinus.  Enlargement of cardiac silhouette.  Mediastinal contour stable.  Medial RIGHT lung base infiltrate again seen.  Infiltrate versus atelectasis in retrocardiac LEFT lower lobe.  No gross pleural effusion or pneumothorax.  IMPRESSION: No pneumothorax post pacemaker insertion.  Otherwise no change.   Electronically Signed   By: Ulyses Southward M.D.   On: 03/17/2014 23:30    Current Medications:  . atropine  0.4 mg Intravenous Once  .  ceFAZolin (ANCEF) IV  1 g  Intravenous Q6H  . insulin aspart  0-9 Units Subcutaneous TID WC  . pantoprazole  40 mg Oral Daily  . predniSONE  60 mg Oral Q breakfast      ASSESSMENT AND PLAN: Principal Problem:  Cardiac arrest - pulseless VT, had a 10-15 bt run last pm, amio d/c'd 01/10.  Active Problems:  VT (ventricular tachycardia) - Multifocal monomorphic - keep K+ 4.0, Mg 2.0, no CAD but has cardiac sarcoid by cardiac MRI.    Pulmonary sarcoidosis - by report   Right bundle branch block - follow   Atrioventricular (AV) dissociation - not on rate-lowering Rx   Non-ischemic cardiomyopathy - EF ~15-20% by Echo - follow, add BB and consider ACE/ARB once PPM inserted, need daily weights, I/O, last CVP was 01/09 and was 6. S/p BiV ICD 01/11. CXR  pending   Shock circulatory - improved, BP high now.   Acute respiratory failure with hypoxia - improved, CCM following, no acute needs   Acute encephalopathy - follow, slow improvement   Sarcoidosis - pulmonary by history, cardiac by MRI, med changes per MD.  Plan - CCM requests change to our service. He may be able to go home today, pending PT eval, speech to see, CXR post-device.   Signed, Theodore Demark , PA-C 7:26 AM 03/18/2014  EP attending  Patient seen and examined. See my rounding note as well.    Leonia Reeves.D.

## 2014-03-18 NOTE — Progress Notes (Signed)
PT Cancellation Note  Patient Details Name: Christopher Patrick MRN: [id number] DOB: [birth year]   Cancelled Treatment:    Reason Eval/Treat Not Completed: Fatigue limiting ability to participate. Pt sleeping and declined amb at this time.   Kreg Earhart 03/18/2014, 4:23 PM  [facility] PT [phone]

## 2014-03-18 NOTE — Progress Notes (Signed)
Speech Language Pathology Treatment: Dysphagia  Patient Details Name: KONSTANTIN LEHNEN MRN: [id number] DOB: 10-25-1965 Today's Date: 03/18/2014 Time: 0820-0830 SLP Time Calculation (min) (ACUTE ONLY): 10 min  Assessment / Plan / Recommendation Clinical Impression  Pt demonstrates adequate tolerance of thin liquids and solids. No evidence of aspiration. Acute reversible dysphagia resulting from brief intubation resolved. Pt may resume regular diet and thin liquids. No SLP f/u needed. Will sign off.    HPI HPI: The patient is a 49 y/o male with no prior cardiac history. His PMH is significant for sarcoid with known pulmonary involement and GERD. He presented to [facility]/11/14 after sustaining a witnessed out of hospital cardiac arrest at home.   Pertinent Vitals Pain Assessment: No/denies pain  SLP Plan  All goals met    Recommendations Diet recommendations: Regular;Thin liquid Liquids provided via: Cup;Straw Medication Administration: Whole meds with liquid Supervision: Patient able to self feed;Intermittent supervision to cue for compensatory strategies Postural Changes and/or Swallow Maneuvers: Seated upright 90 degrees              Oral Care Recommendations: Patient independent with oral care Follow up Recommendations: None Plan: All goals met    GO    Herbie Baltimore, MA CCC-SLP [phone]  Lynann Beaver 03/18/2014, 8:33 AM

## 2014-03-19 ENCOUNTER — Encounter (HOSPITAL_COMMUNITY): Payer: Self-pay | Admitting: *Deleted

## 2014-03-19 LAB — BASIC METABOLIC PANEL
ANION GAP: 6 (ref 5–15)
BUN: 30 mg/dL — ABNORMAL HIGH (ref 6–23)
CHLORIDE: 94 meq/L — AB (ref 96–112)
CO2: 33 mmol/L — AB (ref 19–32)
CREATININE: 1.32 mg/dL (ref 0.50–1.35)
Calcium: 8.5 mg/dL (ref 8.4–10.5)
GFR calc Af Amer: 71 mL/min — ABNORMAL LOW (ref >90)
GFR, EST NON AFRICAN AMERICAN: 61 mL/min — AB (ref >90)
GLUCOSE: 101 mg/dL — AB (ref 70–99)
Potassium: 4.2 mmol/L (ref 3.5–5.1)
Sodium: 133 mmol/L — ABNORMAL LOW (ref 135–145)

## 2014-03-19 LAB — CBC
HCT: 42.5 % (ref 39.0–52.0)
Hemoglobin: 15 g/dL (ref 13.0–17.0)
MCH: 30.4 pg (ref 26.0–34.0)
MCHC: 35.3 g/dL (ref 30.0–36.0)
MCV: 86 fL (ref 78.0–100.0)
Platelets: 91 10*3/uL — ABNORMAL LOW (ref 150–400)
RBC: 4.94 MIL/uL (ref 4.22–5.81)
RDW: 13.2 % (ref 11.5–15.5)
WBC: 9.4 10*3/uL (ref 4.0–10.5)

## 2014-03-19 LAB — MAGNESIUM: MAGNESIUM: 2.1 mg/dL (ref 1.5–2.5)

## 2014-03-19 NOTE — Progress Notes (Signed)
Talked with pharmacist about other recommendations for hiccups. Haldol 2.5mg  given as a suggestion. If hiccups continue will discuss with MD. Monitoring closely.

## 2014-03-19 NOTE — Progress Notes (Signed)
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 Herbie Baltimore EP: Dr Ladona Ridgel  Patient Profile: 49 yo male w/ hx sarcoid, GERD, was admitted 01/05 after cardiac arrest, VT storm, sarcoid on cardiac MRI. LV EF 46% & RV EF 33% per MRI 01/11, LV 15-20% by echo 01/06, no sig CAD at cath.  SUBJECTIVE: A couple of episodes of hiccups, MS chest pain, no SOB  OBJECTIVE Filed Vitals:   03/18/14 1900 03/18/14 2000 03/18/14 2351 03/19/14 0400  BP: 125/77 95/66 114/60 139/74  Pulse: 66 69 81 62  Temp: 97.4 F (36.3 C)  97.3 F (36.3 C) 97.8 F (36.6 C)  TempSrc: Oral  Oral Oral  Resp: Height:      Weight:      SpO2: 94% 94% 93% 94%    Intake/Output Summary (Last 24 hours) at 03/19/14 0727 Last data filed at 03/19/14 0500  Gross per 24 hour  Intake    490 ml  Output    700 ml  Net   -210 ml   Filed Weights   03/13/14 0400 03/14/14 0330 03/18/14 0800  Weight: 218 lb 0.6 oz (98.9 kg) 216 lb 4.7 oz (98.11 kg) 208 lb 9.6 oz (94.62 kg)    PHYSICAL EXAM General: Well developed, well nourished, male in no acute distress. Head: Normocephalic, atraumatic.  Neck: Supple without bruits, JVD not elevated. Lungs:  Resp regular and unlabored, CTA. Heart: IRRR, S1, S2, no S3, S4, or murmur; no rub. Abdomen: Soft, non-tender, non-distended, BS + x 4.  Extremities: No clubbing, cyanosis, no edema.  Neuro: Alert and oriented X 3. Moves all extremities spontaneously. Psych: Normal affect.  LABS: CBC: Recent Labs  03/17/14 0432 03/18/14 0325 03/19/14 0250  WBC 12.2* 7.7  9.4  NEUTROABS 10.9* 6.6  --   HGB 13.9 15.4 15.0  HCT 41.0 43.8 42.5  MCV 88.2 87.4 86.0  PLT 120* 107* 91*   Basic Metabolic Panel: Recent Labs  03/18/14 0325 03/19/14 0250  NA 139 133*  K 4.1 4.2  CL 102 94*  CO2 24 33*  GLUCOSE 135* 101*  BUN 31* 30*  CREATININE 1.10 1.32  CALCIUM 8.5 8.5  MG 2.0 2.1   TELE:  SR, V pacing, PVCs and pairs  Radiology/Studies: Dg Chest 2 View 03/18/2014   CLINICAL DATA:  49 year old pacer placement, chest pain and hiccups  EXAM: CHEST  2 VIEW  COMPARISON:  03/17/2013  FINDINGS: A left-sided pacing/ AICD device is noted, leads project over the right atrium, right ventricle and left ventricle.  The cardiac silhouette is enlarged. The mediastinal contours are within normal limits.  There is no focal airspace consolidation, pleural effusion, pneumothorax or overt pulmonary edema.  Degenerative changes of the spine are noted.  IMPRESSION: 1. Left-sided pacer placement. 2. Cardiomegaly without overt pulmonary edema or focal airspace consolidation.   Electronically Signed   By: Fannie Knee   On: 03/18/2014 10:51   Dg Chest Port 1 View 03/17/2014   CLINICAL DATA:  Post pacemaker insertion, history sarcoidosis  EXAM: PORTABLE CHEST - 1 VIEW  COMPARISON:  Portable exam 2021 hr compared to 03/15/2014  FINDINGS: LEFT subclavian transvenous pacemaker with leads projecting over RIGHT atrium, RIGHT ventricle and coronary sinus.  Enlargement of cardiac silhouette.  Mediastinal contour stable.  Medial RIGHT lung base infiltrate again seen.  Infiltrate versus atelectasis in retrocardiac LEFT lower lobe.  No gross pleural effusion or pneumothorax.  IMPRESSION: No pneumothorax post pacemaker insertion.  Otherwise no change.   Electronically Signed   By: Ulyses Southward M.D.   On: 03/17/2014 23:30   Mr Card Morphology Wo/w Cm 03/17/2014   CLINICAL DATA:  Nonischemic cardiomyopathy, assess for cardiac involvement by sarcoidosis.  EXAM: CARDIAC MRI  TECHNIQUE: The patient was  scanned on a 1.5 Tesla GE magnet. A dedicated cardiac coil was used. Functional imaging was done using Fiesta sequences. 2,3, and 4 chamber views were done to assess for RWMA's. Modified Simpson's rule using a short axis stack was used to calculate an ejection fraction on a dedicated work [employer]. The patient received 30 cc of Multihance. After 10 minutes inversion recovery sequences were used to assess for infiltration and scar tissue.  FINDINGS: There were trivial bilateral pleural effusions and a trivial pericardial effusion.  Normal left ventricular size and normal wall thickness. Mildly decreased systolic function, EF 46%. There was hypokinesis of the basal to mid anteroseptal wall and the apical inferior wall. The right ventricle was moderately dilated with mild to moderately decreased systolic function, EF 33%. The aortic valve was trileaflet with no significant stenosis or regurgitation. There appeared to be mild mitral regurgitation though flow sequences to quantify were not done. There was mild biatrial enlargement.  On delayed enhancement imaging, there was patchy late gadolinium enhancement (LGE) primarily in the mid-wall of the basal to mid septum and anterior wall. There was an large nodular area of LGE in the mid anteroseptum that was near-full thickness. There was subendocardial LGE in the mid inferolateral wall and subepicardial LGE in the apical inferior wall. There also was an area of LGE in the RV free wall.  MEASUREMENTS: MEASUREMENTS LV EDV 227 mL  LV SV 104 mL  LV EF 46%  RV EDV 271 mL  RV SV 91 mL  RV EF 33%  IMPRESSION: 1. Normal LV size with EF 46%. Wall motion abnormalities as noted above.  2. Moderately dilated RV with mild to moderate systolic dysfunction, EF 33%.  3. Pattern of LGE as noted above. 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  Dalton Mclean   Electronically Signed   By: Marca Ancona M.D.   On: 03/17/2014 10:48     Current Medications:  .  amiodarone  400 mg Oral BID  . carvedilol  3.125 mg Oral BID WC  . docusate sodium  100 mg Oral Daily  . pantoprazole  40 mg Oral Daily  . polyethylene glycol  17 g Oral Daily  . predniSONE  60 mg Oral Q breakfast  . ramipril  2.5 mg Oral Daily      ASSESSMENT AND PLAN: Principal Problem:  Cardiac arrest - pulseless VT, had a 10-15 bt run 01/11 pm, amio d/c'd 01/10, restarted 01/12  Active Problems:  VT (ventricular tachycardia) - Multifocal monomorphic - keep K+ 4.0, Mg 2.0, no CAD but has cardiac sarcoid by cardiac MRI. Steroids per CCM, need info on taper or d/c of them. D/c Thorazine. 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 S/p BiV ICD 01/11. CXR OK   Shock circulatory - improved, BP high now.   Acute respiratory failure with hypoxia - improved, CCM following, no acute needs   Acute encephalopathy - follow, slow improvement   Sarcoidosis - pulmonary by history, cardiac by MRI, med changes per MD.    Hiccups - old remedies are working, d/c Thorazine  Plan - Will change to our service. Tx telemetry.  Possible d/c in am. Will watch on tele. I am concerned about his dense ventricular ectopy and for this reason will keep patient in hospital and allow him to work with therapist and ambulate under supervision.  SignedTheodore Demark , PA-C 7:27 AM 03/19/2014   EP Attending  Patient seen and examined. I have amended [employer] note and agree with the findings above which represent my findings with amendments. Will transfer to tele, ambulate, continue amiodarone, beta blocker and ACE inhibitor. Hopefully home tomorrow if  ventricular ectopy and hiccups improved.   Leonia Reeves.D.

## 2014-03-19 NOTE — Progress Notes (Signed)
Physical Therapy Treatment Patient Details Name: Christopher Patrick MRN: [id number] DOB: [birth year] Today's Date: 03/19/2014    History of Present Illness 49 yo male w/ hx sarcoid, GERD, was admitted 01/05 after cardiac arrest, VT was recurrent. Pt underwent ICD placement 03/17/13.    PT Comments    Pt. Was provided with education as to warning signs of a heart problem so he can notice them upon discharge. Pt. Demonstrated and verbalized precautions for pacemaker.    Follow Up Recommendations  No PT follow up;Supervision/Assistance - 24 hour     Equipment Recommendations  None recommended by PT    Recommendations for Other Services       Precautions / Restrictions Restrictions Weight Bearing Restrictions: No    Mobility  Bed Mobility Overal bed mobility: Modified Independent Bed Mobility: Sit to Supine       Sit to supine: Modified independent (Device/Increase time)   General bed mobility comments: cues for hand placement and direction of entry into the bed  Transfers Overall transfer level: Modified independent Equipment used: None Transfers: Sit to/from Stand Sit to Stand: Modified independent (Device/Increase time)            Ambulation/Gait Ambulation/Gait assistance: Min guard Ambulation Distance (Feet): 300 Feet Assistive device: None Gait Pattern/deviations: Step-through pattern;Staggering right   Gait velocity interpretation: at or above normal speed for age/gender General Gait Details: Unsteady gait especially when distracted.   Stairs            Wheelchair Mobility    Modified Rankin (Stroke Patients Only)       Balance                                    Cognition Arousal/Alertness: Awake/alert Behavior During Therapy: WFL for tasks assessed/performed Overall Cognitive Status: Within Functional Limits for tasks assessed                      Exercises      General Comments        Pertinent  Vitals/Pain Pain Assessment: No/denies pain    Home Living                      Prior Function            PT Goals (current goals can now be found in the care plan section) Progress towards PT goals: Progressing toward goals    Frequency  Min 3X/week    PT Plan Current plan remains appropriate    Co-evaluation             End of Session Equipment Utilized During Treatment: Gait belt Activity Tolerance: Patient tolerated treatment well Patient left: in bed;with family/visitor present     Time: 1338-1400 PT Time Calculation (min) (ACUTE ONLY): 22 min  Charges:                       G Codes:      Christopher Patrick, SPTA 03/19/2014, 2:10 PM

## 2014-03-19 NOTE — Progress Notes (Signed)
Patient OOB to bathroom with assist. Unsteady on feet but able to sit in bathroom for bowel movement. Wife in room and patient was talking to wife while he was in the bathroom. Patient stated he walked with PT one time today and spent the rest of the day in bed catching up on sleep. Advised patient to get OOB for all meals today and move up and down with his legs and not his arms. Patient stated he understood and would not put pressure on his left arm. ICD dressing clean dry and intact. Patient continues with non stop Hiccups. Home remedies tried and failed, thorazine tried and failed. Patients family in room 24/7 offering water. Will monitor closely.

## 2014-03-19 NOTE — Progress Notes (Signed)
Report called to RN on 2W.  Pt requested to ambulate to new room from 2H.  Ambulated pushing WC with staff on telemetry. Tolerated well.  Rec'd by 2W RN. Ainslie Mazurek, [facility]

## 2014-03-20 ENCOUNTER — Telehealth: Payer: Self-pay | Admitting: Physician Assistant

## 2014-03-20 ENCOUNTER — Encounter (HOSPITAL_COMMUNITY): Payer: Self-pay | Admitting: Physician Assistant

## 2014-03-20 DIAGNOSIS — I442 Atrioventricular block, complete: Secondary | ICD-10-CM | POA: Diagnosis present

## 2014-03-20 DIAGNOSIS — K219 Gastro-esophageal reflux disease without esophagitis: Secondary | ICD-10-CM | POA: Diagnosis present

## 2014-03-20 LAB — COMPREHENSIVE METABOLIC PANEL
ALBUMIN: 3 g/dL — AB (ref 3.5–5.2)
ALT: 19 U/L (ref 0–53)
AST: 24 U/L (ref 0–37)
Alkaline Phosphatase: 55 U/L (ref 39–117)
Anion gap: 13 (ref 5–15)
BILIRUBIN TOTAL: 1.3 mg/dL — AB (ref 0.3–1.2)
BUN: 21 mg/dL (ref 6–23)
CALCIUM: 9.1 mg/dL (ref 8.4–10.5)
CO2: 31 mmol/L (ref 19–32)
Chloride: 98 mEq/L (ref 96–112)
Creatinine, Ser: 1.18 mg/dL (ref 0.50–1.35)
GFR calc non Af Amer: 70 mL/min — ABNORMAL LOW (ref >90)
GFR, EST AFRICAN AMERICAN: 82 mL/min — AB (ref >90)
Glucose, Bld: 89 mg/dL (ref 70–99)
Potassium: 4.1 mmol/L (ref 3.5–5.1)
Sodium: 142 mmol/L (ref 135–145)
Total Protein: 5.1 g/dL — ABNORMAL LOW (ref 6.0–8.3)

## 2014-03-20 MED ORDER — CARVEDILOL 6.25 MG PO TABS: 6.2500 mg | ORAL_TABLET | Freq: Two times a day (BID) | ORAL | Status: DC

## 2014-03-20 MED ORDER — AMIODARONE HCL 200 MG PO TABS: 400.0000 mg | ORAL_TABLET | Freq: Every day | ORAL | Status: DC

## 2014-03-20 MED ORDER — PREDNISONE 20 MG PO TABS: 60.0000 mg | ORAL_TABLET | Freq: Every day | ORAL | Status: DC

## 2014-03-20 MED ORDER — AMIODARONE HCL 400 MG PO TABS: 400.0000 mg | ORAL_TABLET | Freq: Every day | ORAL | Status: DC

## 2014-03-20 MED ORDER — RAMIPRIL 2.5 MG PO CAPS: 2.5000 mg | ORAL_CAPSULE | Freq: Every day | ORAL | Status: DC

## 2014-03-20 MED ORDER — AMIODARONE HCL 400 MG PO TABS: 400.0000 mg | ORAL_TABLET | Freq: Two times a day (BID) | ORAL | Status: DC

## 2014-03-20 NOTE — Discharge Summary (Signed)
Discharge Summary   Patient ID: Christopher Patrick MRN: [id number], DOB/AGE: 1965/10/19 49 y.o. Admit date: 03/11/2014 D/C date:     03/20/2014  Primary Cardiologist: Dr. Shirlee Latch Dr. 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   HPI: Christopher Patrick is a 49 y.o. male with a history of sarcoid w/ pulmonary involvement and GERD who was admitted to [facility] on 03/11/14 after cardiac arrest and VT storm.  Per family, he had hand paresthesias and palpitations a day prior to admission but otherwise was in his usual state of health. Recently started on PPI 2 days before. He was sitting on the couch and told his wife he didn't feel like he had control and shortly after had LOC. No head trauma. Upon EMS arrival, he became pulseless and CPR was initiated. He underwent CPR for 15 minutes (total CPR 20 minutes). He was defibrillated x 3 and received 2 rounds of epi. He also received 150mg  of amiodarone. He was intubated in the ED and given an additional 150mg  of amiodarone. He received 2g of IV magnesium for prolonged QTc. He was seen by cardiology and started on amio gtt and lidocaine gtt.  Hospital Course  Cardiac sarcoid- diagnosed on cardiac MRI. LV EF 46% & RV EF 33% per MRI -- LVEF 15-20% by echo 01/06, no sig CAD by cardiac cath  -- Placed on prednisone 60mg  po qd. Will start taper as an outpatient after follow up appointment with Dr. Craige Cotta.    VT arrest/ recurrent multifocal/monophasic VT requiring multiple shock and prolonged ACLS/CPR.  He was intubated and placed on hypothermia protocol in the unit. -- S/p LHC on 03/12/14 to rule out ischemic etiology. This revealed normal coronary arteries  -- Now s/p BiV ICD placement on 03/17/14 -- Potassium and magnesium repleted to keep K> 4.0, Mg >2.0. -- Continue amiodarone 400mg  BID for 2 weeks and then 400mg  daily until follow up with Dr. Ladona Ridgel. (he has received 5 doses in the hospital, so she will continue 400 mg BID for 12 more days. Then 400mg  qd thereafter)  -- No driving X6 months, patient aware.   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 CXR OK and device functioning well.  -- Continue BB and ACE  Acute encephalopathy - slow improvement. Now back to baseline  Hiccups - was prescribed Thorazine for management of hiccups.? This has been discontinued   The patient has had an uncomplicated hospital course and is recovering well. The femoral catheter site is stable. He has been seen by Dr. Ladona Ridgel today and deemed ready for discharge home. All follow-up appointments have been scheduled. Discharge medications are listed below. No driving X6 months, patient aware.     Discharge Vitals: Blood pressure 107/56, pulse 66, temperature 97.8 F (36.6 C), temperature source Oral, resp. rate 18, height 6' (1.829 m), weight 208 lb 9.6  oz (94.62 kg), SpO2 99 %.  Labs: Lab Results  Component Value Date   WBC 9.4 03/19/2014   HGB 15.0 03/19/2014   HCT 42.5 03/19/2014   MCV 86.0 03/19/2014   PLT 91* 03/19/2014     Recent Labs Lab 03/20/14 0539  NA 142  K 4.1  CL 98  CO2 31  BUN 21  CREATININE 1.18  CALCIUM 9.1  PROT 5.1*  BILITOT 1.3*  ALKPHOS 55  ALT 19  AST 24  GLUCOSE 89    Diagnostic Studies/Procedures   Dg Chest 1 View  03/13/2014   CLINICAL DATA:  Cardiac arrest.   Sarcoid.  EXAM: CHEST - 1 VIEW  COMPARISON:  03/12/2014  FINDINGS: Endotracheal tube in good position. Central venous catheter tip in the lower SVC. No pneumothorax. Gastric tube in the stomach.  Improvement in left upper lobe and left lower lobe airspace disease. Improvement in right perihilar infiltrate. Progression of right middle lobe collapse. No significant effusion.  IMPRESSION: Improving bilateral infiltrates. Progression of right middle lobe collapse.   Electronically Signed   By: Marlan Palau M.D.   On: 03/13/2014 07:27   Dg Chest 2 View  03/18/2014   CLINICAL DATA:  49 year old pacer placement, chest pain and hiccups  EXAM: CHEST  2 VIEW  COMPARISON:  03/17/2013  FINDINGS: A left-sided pacing/ AICD device is noted, leads project over the right atrium, right ventricle and left ventricle.  The cardiac silhouette is enlarged. The mediastinal contours are within normal limits.  There is no focal airspace consolidation, pleural effusion, pneumothorax or overt pulmonary edema.  Degenerative changes of the spine are noted.  IMPRESSION: 1. Left-sided pacer placement. 2. Cardiomegaly without overt pulmonary edema or focal airspace consolidation.   Electronically Signed   By: Fannie Knee   On: 03/18/2014 10:51   Dg Chest Port 1 View  03/17/2014   CLINICAL DATA:  Post pacemaker insertion, history sarcoidosis  EXAM: PORTABLE CHEST - 1 VIEW  COMPARISON:  Portable exam 2021 hr compared to 03/15/2014  FINDINGS: LEFT subclavian transvenous pacemaker with leads projecting over RIGHT atrium, RIGHT ventricle and coronary sinus.  Enlargement of cardiac silhouette.  Mediastinal contour stable.  Medial RIGHT lung base infiltrate again seen.  Infiltrate versus atelectasis in retrocardiac LEFT lower lobe.  No gross pleural effusion or pneumothorax.  IMPRESSION: No pneumothorax post pacemaker insertion.  Otherwise no change.   Electronically Signed   By: Ulyses Southward M.D.   On: 03/17/2014 23:30   Dg Chest Port 1  View  03/15/2014   CLINICAL DATA:  Ventricular tachycardia. History of sarcoid and GERD.  EXAM: PORTABLE CHEST - 1 VIEW  COMPARISON:  03/14/2014; 03/13/2014; 03/11/2014; chest CT - 11/29/2011  FINDINGS: Grossly unchanged enlarged cardiac silhouette and mediastinal contours. Interval extubation and removal of enteric tube. Otherwise, stable position of remaining support apparatus. No pneumothorax. Continued improved aeration of the lungs with persistent bibasilar heterogeneous airspace opacities. Trace bilateral effusions are not excluded. No definite evidence of edema. Unchanged bones.  IMPRESSION: 1. Interval extubation and removal of enteric tube. Otherwise, stable position of remaining support apparatus. No pneumothorax. 2. Improved aeration of lungs with persistent bilateral infrahilar opacities again worrisome for multifocal infection. Continued attention on follow-up is recommended.   Electronically Signed   By: Simonne Come M.D.   On: 03/15/2014 08:41   Dg Chest Port 1 View  03/14/2014   CLINICAL DATA:  Respiratory failure.  EXAM: PORTABLE CHEST - 1 VIEW  COMPARISON:  03/13/2014.  FINDINGS: Endotracheal  tube, NG tube, left of the stent position. Persistent bilateral pulmonary infiltrates consistent with pneumonia. Interim partial clearing of right base atelectasis. No pleural effusion or pneumothorax. Stable cardiomegaly with normal pulmonary vascularity. Degenerative changes thoracic spine.  IMPRESSION: 1. Persistent bilateral pulmonary infiltrates consistent pneumonia. 2. Interim partial clearing of right base atelectasis. 3. Stable cardiomegaly.   Electronically Signed   By: Maisie Fus  Register   On: 03/14/2014 07:36   Dg Chest Port 1 View  03/12/2014   CLINICAL DATA:  Central line placement.  EXAM: PORTABLE CHEST - 1 VIEW  COMPARISON:  03/11/2014  FINDINGS: Endotracheal tube tip measures 5.3 cm above the carinal. Enteric tube tip projects over the EG junction. Interval placement of a left central venous  catheter with tip over the cavoatrial junction. No pneumothorax. Persistent cardiac enlargement and perihilar infiltrates.  IMPRESSION: Left central venous catheter with tip over the cavoatrial junction. No pneumothorax. Enteric tube tip remains at the level of the EG junction. Cardiac enlargement. Bilateral perihilar infiltrates.   Electronically Signed   By: Burman Nieves M.D.   On: 03/12/2014 03:23   Dg Chest Port 1 View  03/12/2014   CLINICAL DATA:  Post CPR to assess endotracheal tube.  EXAM: PORTABLE CHEST - 1 VIEW  COMPARISON:  None.  FINDINGS: Two films were obtained, initial image demonstrating endotracheal tube tip 9 cm above the carina and second image demonstrating endotracheal tube tip 3.9 cm above the carina. Enteric tube tip is at the level of the EG junction with proximal side hole probably in the lower esophagus. Shallow inspiration. Cardiac enlargement with normal pulmonary vascularity. Focal opacity in the left mid lung consistent with infiltration or atelectasis. No blunting of costophrenic angles. No pneumothorax. Nodule in the right mid lung probably represents a calcified granuloma.  IMPRESSION: Endotracheal tube appears to be in good position. Enteric tube is somewhat shallow with proximal side hole probably in the distal esophageal region. Cardiac enlargement. Infiltration or atelectasis in the left mid lung.   Electronically Signed   By: Burman Nieves M.D.   On: 03/12/2014 00:13   Dg Abd Portable 1v  03/12/2014   CLINICAL DATA:  OG placement.  EXAM: PORTABLE ABDOMEN - 1 VIEW  COMPARISON:  None.  FINDINGS: Enteric tube tip projects over the EG junction region with proximal side hole projecting over the lower esophagus. Cardiac enlargement. Infiltrates noted in the lower lungs.  IMPRESSION: Enteric tube tip projects over the EG junction with proximal side hole probably in the lower esophagus.   Electronically Signed   By: Burman Nieves M.D.   On: 03/12/2014 03:25   Mr Card  Morphology Wo/w Cm  03/17/2014   CLINICAL DATA:  Nonischemic cardiomyopathy, assess for cardiac involvement by sarcoidosis.  EXAM: CARDIAC MRI  TECHNIQUE: The patient was scanned on a 1.5 Tesla GE magnet. A dedicated cardiac coil was used. Functional imaging was done using Fiesta sequences. 2,3, and 4 chamber views were done to assess for RWMA's. Modified Simpson's rule using a short axis stack was used to calculate an ejection fraction on a dedicated work [employer]. The patient received 30 cc of Multihance. After 10 minutes inversion recovery sequences were used to assess for infiltration and scar tissue.  FINDINGS: There were trivial bilateral pleural effusions and a trivial pericardial effusion.  Normal left ventricular size and normal wall thickness. Mildly decreased systolic function, EF 46%. There was hypokinesis of the basal to mid anteroseptal wall and the apical inferior wall. The right ventricle  was moderately dilated with mild to moderately decreased systolic function, EF 33%. The aortic valve was trileaflet with no significant stenosis or regurgitation. There appeared to be mild mitral regurgitation though flow sequences to quantify were not done. There was mild biatrial enlargement.  On delayed enhancement imaging, there was patchy late gadolinium enhancement (LGE) primarily in the mid-wall of the basal to mid septum and anterior wall. There was an large nodular area of LGE in the mid anteroseptum that was near-full thickness. There was subendocardial LGE in the mid inferolateral wall and subepicardial LGE in the apical inferior wall. There also was an area of LGE in the RV free wall.  MEASUREMENTS: MEASUREMENTS LV EDV 227 mL  LV SV 104 mL  LV EF 46%  RV EDV 271 mL  RV SV 91 mL  RV EF 33%  IMPRESSION: 1. Normal LV size with EF 46%. Wall motion abnormalities as noted above.  2. Moderately dilated RV with mild to moderate systolic dysfunction, EF 33%.  3. Pattern of LGE as noted  above. 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  Dalton Mclean   Electronically Signed   By: Marca Ancona M.D.   On: 03/17/2014 10:48   Ct Portable Head W/o Cm  03/12/2014   CLINICAL DATA:  Status post CPR, cardiac arrest. Acute encephalopathy.  EXAM: CT HEAD WITHOUT CONTRAST  TECHNIQUE: Contiguous axial images were obtained from the base of the skull through the vertex without intravenous contrast.  COMPARISON:  None.  FINDINGS: The ventricles and sulci are normal. Somewhat poor gray-white matter differentiation though, there is streak artifact from life support lines. No intraparenchymal hemorrhage, mass effect nor midline shift. No acute large vascular territory infarcts.  No abnormal extra-axial fluid collections. Basal cisterns are patent.  No skull fracture. The included ocular globes and orbital contents are non-suspicious. Bilateral maxillary mucosal retention cyst, moderate paranasal sinusitis. Mastoid air cells are well aerated.  IMPRESSION: Mild loss of the expected gray-white matter differentiation, this could be artifact or reflect very early global brain edema/ anoxia. Recommend followup.   Electronically Signed   By: Awilda Metro   On: 03/12/2014 06:00    03/12/14 Cardiac Catheterization Procedure Note Left ventriculography: Was not performed. Ejection fraction was 15-20% by echo.  Final Conclusions:  1. Normal coronary arteries. 2. Mildly elevated left ventricular end-diastolic pressure.  3. Severely reduced LV systolic function by echo. Recommendations: The patient has nonischemic cardiomyopathy which could be due to myocardial sarcoidosis. Continue treatment for ventricular tachycardia. 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    2D ECHO  Study Date: 03/12/2014 LV EF: 15% - 20% Study Conclusions - Left ventricle: The cavity size was normal. Wall thickness was normal. Systolic function was severely reduced. The  estimated ejection fraction was in the range of 15% to 20%. Diffuse hypokinesis. - Right ventricle: The cavity size was moderately dilated. Systolic function was severely reduced. - Right atrium: The atrium was mildly dilated. 49 yo male w/ hx sarcoid, GERD, was admitted 01/05 after cardiac arrest, VT storm, sarcoid on cardiac MRI. LV EF 46% & RV EF 33% per MRI 01/11, LV 15-20% by echo 01/06, no sig CAD at cath.   Device procedure 03/17/14   INTRODUCTION: NORMON PETTIJOHN is a 49 y.o. male with a nonischemic CM (EF 40%), NYHA Class I CHF, and complete heart block. At this time, he meets criteria for ICD implantation for secondary prevention of sudden death. He presented with a VF  arrest. Given compete heart block, the patient may also be expected to benefit from resynchronization therapy. The etiology of his heart block and VT/VF arrest is thought due to sarcoidosis involving his heart and cardiac MRI supports the diagnosis.  RA/RV Lead Placement: The left axillary vein was cannulated with fluoroscopic visualization. No contrast was required for this endeavor. Through the left axillary vein, a St. Jude [id number] (serial # [id number] ) right atrial lead and a [employer] (serial number [id number]) [employer] [place] right ventricular defibrillator lead were advanced with fluoroscopic visualization into the right atrial appendage and right ventricular apical septal positions respectively. Initial atrial lead P-waves measured 1.1 mV with an impedance of 474 ohms and a threshold of 0.6 volts at 0.5 milliseconds. The right ventricular lead R-wave measured 22 mV with impedance of 800 ohms and a threshold of 0.7 volts at 0.5 milliseconds.  LV Lead Placement: A St. Jude guide was advanced through the left axillary vein into the low lateral right atrium. A 6 french hexapolar EP catheter was introduced through the [place]. Jude guide and used to cannulate the coronary sinus. A coronary sinus nonselective  venogram was performed by hand injection of nonionic contrast. This demonstrated a large posterolateral vein.  A 0.014 angioplasty guide wire was introduced through the St. Jude Guide and advanced into the posterolateral vein. A St. Jude(serial number [id number]) lead was advanced through the posterior vein. This was approximately two-thirds from the base to the apex in a very lateral position. In this location with 3-4 bipolar configuration, the left ventricular lead R-waves measured 21 mV with impedance of 940 ohms and a threshold of 1.1 volt at 0.5 Milliseconds with no diaphragmatic stimulation observed when pacing at 10 volts output. The St. Jude guide was therefore removed.  All three leads were secured to the pectoralis fascia using #2 silk suture over the suture sleeves. The pocket then irrigated with copious gentamicin solution.  CONCLUSIONS:  1. Nonischemic cardiomyopathy with Left bundle-branch block and chronic New York Heart Association class III heart failure.  2. Successful biventricular ICD implantation.  3. DFT less than or equal to 15 joules.  4. No inducible VT or VF with PES 5. No early apparent complications.    Discharge Medications     Medication List    TAKE these medications        amiodarone 400 MG tablet  Commonly known as:  PACERONE  Take 1 tablet (400 mg total) by mouth 2 (two) times daily.     amiodarone 400 MG tablet  Commonly known as:  PACERONE  Take 1 tablet (400 mg total) by mouth daily.  Start taking on:  04/01/2014     carvedilol 6.25 MG tablet  Commonly known as:  COREG  Take 1 tablet (6.25 mg total) by mouth 2 (two) times daily with a meal.     pantoprazole 20 MG tablet  Commonly known as:  PROTONIX  Take 20 mg by mouth 2 (two) times daily.     predniSONE 20 MG tablet  Commonly known as:  DELTASONE  Take 3 tablets (60 mg total) by mouth daily with breakfast.     ramipril 2.5 MG capsule  Commonly known as:  ALTACE  Take 1 capsule  (2.5 mg total) by mouth daily.        Disposition   The patient will be discharged in stable condition to home.  Follow-up Information    Follow up with Ronie Spies, PA-C On 03/26/2014.   Specialty:  Cardiology   Why:  8am   Contact information:   [address] [place] [address] [phone]       Follow up with [facility] On 03/26/2014.   Why:  9am for your wound check   Contact information:   [address] [place] [address] [phone]      Follow up with Lewayne Bunting, MD On 06/23/2014.   Specialty:  Cardiology   Why:  @ 3:15pm    Contact information:   [address] [place] [address] [phone]       Follow up with [facility], NP On 03/27/2014.   Specialty:  Nurse Practitioner   Why:  @ 2;45pm   Contact information:   [address] [place] [address] [phone]       Follow up with Marca Ancona, MD.   Specialty:  Cardiology   Why:  The office will call you to make an appoinment.   Contact information:   [address] [place] [address] [phone]       Follow up with Coralyn Helling, MD On 05/01/2014.   Specialty:  Pulmonary Disease   Why:  2 pm   Contact information:   [address] [place] [address] [phone]         Duration of Discharge Encounter: Greater than 30 minutes including physician and PA time.  Byrd Hesselbach R PA-C 03/20/2014, 1:04 PM   EP Attending  Patient seen and examined. Agree with above.  Leonia Reeves.D.

## 2014-03-20 NOTE — Telephone Encounter (Signed)
New message      TCM appt on 03-26-14 with Dayna per [facility]

## 2014-03-20 NOTE — Discharge Instructions (Signed)
No Driving for 6 months!!!      Supplemental Discharge Instructions for  Pacemaker/Defibrillator Patients  Activity No heavy lifting or vigorous activity with your left/right arm for 6 to 8 weeks.  Do not raise your left/right arm above your head for one week.  Gradually raise your affected arm as drawn below.              1/12                      1/13                          1/14                          1/15 __  NO DRIVING for  6 months   ;   WOUND CARE - Keep the wound area clean and dry.  Do not get this area wet for one week. No showers for one week; you may shower on     . - The tape/steri-strips on your wound will fall off; do not pull them off.  No bandage is needed on the site.  DO  NOT apply any creams, oils, or ointments to the wound area. - If you notice any drainage or discharge from the wound, any swelling or bruising at the site, or you develop a fever > 101? F after you are discharged home, call the office at once.  Special Instructions - You are still able to use cellular telephones; use the ear opposite the side where you have your pacemaker/defibrillator.  Avoid carrying your cellular phone near your device. - When traveling through airports, show security personnel your identification card to avoid being screened in the metal detectors.  Ask the security personnel to use the hand wand. - Avoid arc welding equipment, MRI testing (magnetic resonance imaging), TENS units (transcutaneous nerve stimulators).  Call the office for questions about other devices. - Avoid electrical appliances that are in poor condition or are not properly grounded. - Microwave ovens are safe to be near or to operate.  Additional information for defibrillator patients should your device go off: - If your device goes off ONCE and you feel fine afterward, notify the device clinic nurses. - If your device goes off ONCE and you do not feel well afterward, call 911. - If your device goes off  TWICE, call 911. - If your device goes off THREE times in one day, call 911.  DO NOT DRIVE YOURSELF OR A FAMILY MEMBER WITH A DEFIBRILLATOR TO THE HOSPITAL--CALL 911.

## 2014-03-20 NOTE — Progress Notes (Signed)
SUBJECTIVE: The patient is doing well today.  At this time, he denies chest pain, shortness of breath, or any new concerns.  He is anxious to go home.  He ambulated yesterday without difficulty.   CURRENT MEDICATIONS: . amiodarone  400 mg Oral BID  . carvedilol  3.125 mg Oral BID WC  . docusate sodium  100 mg Oral Daily  . pantoprazole  40 mg Oral Daily  . polyethylene glycol  17 g Oral Daily  . predniSONE  60 mg Oral Q breakfast  . ramipril  2.5 mg Oral Daily      OBJECTIVE: Physical Exam: Filed Vitals:   03/19/14 2000 03/19/14 2150 03/20/14 0345 03/20/14 0944  BP:  130/74 120/80 107/56  Pulse:  64 66   Temp:  97.6 F (36.4 C) 97.8 F (36.6 C)   TempSrc:  Oral Oral   Resp: 20 18 18    Height:      Weight:      SpO2:  98% 99%    No intake or output data in the 24 hours ending 03/20/14 1019  Telemetry: AV pacing with frequent ventricular ectopy  Physical Exam: Well appearing middle aged man, NAD HEENT: Unremarkable,[place], AT Neck:  6 JVD, no thyromegally Back:  No CVA tenderness Lungs:  Clear with no wheezes, rales, or rhonchi HEART:  Regular rate rhythm, no murmurs, no rubs, no clicks Abd:  soft, positive bowel sounds, no organomegally, no rebound, no guarding Ext:  2 plus pulses, no edema, no cyanosis, no clubbing Skin:  No rashes no nodules Neuro:  CN II through XII intact, motor grossly intact   LABS: Basic Metabolic Panel:  Recent Labs  95/07/22 0325 03/19/14 0250 03/20/14 0539  NA 139 133* 142  K 4.1 4.2 4.1  CL 102 94* 98  CO2 24 33* 31  GLUCOSE 135* 101* 89  BUN 31* 30* 21  CREATININE 1.10 1.32 1.18  CALCIUM 8.5 8.5 9.1  MG 2.0 2.1  --    Liver Function Tests:  Recent Labs  03/20/14 0539  AST 24  ALT 19  ALKPHOS 55  BILITOT 1.3*  PROT 5.1*  ALBUMIN 3.0*   CBC:  Recent Labs  03/18/14 0325 03/19/14 0250  WBC 7.7 9.4  NEUTROABS 6.6  --   HGB 15.4 15.0  HCT 43.8 42.5  MCV 87.4 86.0  PLT 107* 91*    RADIOLOGY: Dg Chest 1  View 03/13/2014   CLINICAL DATA:  Cardiac arrest.  Sarcoid.  EXAM: CHEST - 1 VIEW  COMPARISON:  03/12/2014  FINDINGS: Endotracheal tube in good position. Central venous catheter tip in the lower SVC. No pneumothorax. Gastric tube in the stomach.  Improvement in left upper lobe and left lower lobe airspace disease. Improvement in right perihilar infiltrate. Progression of right middle lobe collapse. No significant effusion.  IMPRESSION: Improving bilateral infiltrates. Progression of right middle lobe collapse.   Electronically Signed   By: Marlan Palau M.D.   On: 03/13/2014 07:27   Mr Card Morphology Wo/w Cm 03/17/2014   CLINICAL DATA:  Nonischemic cardiomyopathy, assess for cardiac involvement by sarcoidosis.  EXAM: CARDIAC MRI  TECHNIQUE: The patient was scanned on a 1.5 Tesla GE magnet. A dedicated cardiac coil was used. Functional imaging was done using Fiesta sequences. 2,3, and 4 chamber views were done to assess for RWMA's. Modified Simpson's rule using a short axis stack was used to calculate an ejection fraction on a dedicated work [employer]. The patient received 30 cc  of Multihance. After 10 minutes inversion recovery sequences were used to assess for infiltration and scar tissue.  FINDINGS: There were trivial bilateral pleural effusions and a trivial pericardial effusion.  Normal left ventricular size and normal wall thickness. Mildly decreased systolic function, EF 46%. There was hypokinesis of the basal to mid anteroseptal wall and the apical inferior wall. The right ventricle was moderately dilated with mild to moderately decreased systolic function, EF 33%. The aortic valve was trileaflet with no significant stenosis or regurgitation. There appeared to be mild mitral regurgitation though flow sequences to quantify were not done. There was mild biatrial enlargement.  On delayed enhancement imaging, there was patchy late gadolinium enhancement (LGE) primarily in the mid-wall of the  basal to mid septum and anterior wall. There was an large nodular area of LGE in the mid anteroseptum that was near-full thickness. There was subendocardial LGE in the mid inferolateral wall and subepicardial LGE in the apical inferior wall. There also was an area of LGE in the RV free wall.  MEASUREMENTS: MEASUREMENTS LV EDV 227 mL  LV SV 104 mL  LV EF 46%  RV EDV 271 mL  RV SV 91 mL  RV EF 33%  IMPRESSION: 1. Normal LV size with EF 46%. Wall motion abnormalities as noted above.  2. Moderately dilated RV with mild to moderate systolic dysfunction, EF 33%.  3. Pattern of LGE as noted above. 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  Dalton Mclean   Electronically Signed   By: Marca Ancona M.D.   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  Increase Coreg to 6.25mg  twice daily at discharge.  Amiodarone  twice daily for 2 weeks then  daily until seen by Dr Ladona Ridgel in 3 months.  Continue Prednisone at  daily and follow-up with Dr Craige Cotta in 1 week to discuss taper. TOC visit scheduled in 1 week, follow up with Dr Shirlee Latch 3-4 weeks in HF clinic (left message with heart failure team to schedule-discussed with Dr Shirlee Latch who agrees to follow patient).    No driving X6 months, patient aware.   Christopher Patrick.D.

## 2014-03-20 NOTE — Progress Notes (Signed)
Pt discharged home Discharge instructions given & reviewed Education discussed  IV dc'd  Tele dc'd  Pt discharged via wheelchair with volunteer services.  Louie Bun S 1:42 PM

## 2014-03-21 NOTE — Telephone Encounter (Signed)
lmtcb post d/c

## 2014-03-21 NOTE — Telephone Encounter (Signed)
Patient contacted regarding discharge from [facility] on 03/17/14.Marland Kitchen  Patient understands to follow up with provider Rush Barer on 03/26/14 at 8:00 AM at [facility]. Patient understands discharge instructions? yes Patient understands medications and regiment? yes Patient understands to bring all medications to this visit? yes  Pt verbalizes understanding of medications.  States he feels good.  Notes no redness or swelling noted in (R) groin cath site.  States pacer site clean and dry. No redness.  Understands not to take a shower for 1 week and to keep area dry.  Understands he has an appointment with Rush Barer on 1/20.  No questions or concerns at this time.  Will call if has questions.

## 2014-03-25 ENCOUNTER — Encounter: Payer: Self-pay | Admitting: Physician Assistant

## 2014-03-25 DIAGNOSIS — N289 Disorder of kidney and ureter, unspecified: Secondary | ICD-10-CM | POA: Insufficient documentation

## 2014-03-25 DIAGNOSIS — D8685 Sarcoid myocarditis: Secondary | ICD-10-CM | POA: Insufficient documentation

## 2014-03-25 NOTE — Progress Notes (Signed)
Cardiology Office Note  Date:  03/26/2014   Patient ID:  Christopher Patrick, Christopher Patrick Jun 20, 1965, MRN [id number]  PCP:  No PCP Per Patient  Cardiologist:  Shirlee Latch per discharge summary Electrophysiologist: Ladona Ridgel  Chief Complaint: here for follow-up of cardiac arrest  History of Present Illness: Christopher Patrick is a 49 y.o. male with history of pulmonary sarcoidosis (by biopsy per patient), recently diagnosed VT arrest, cardiac sarcoid by MRI, CHB s/p St. Jude BiV-ICD 03/17/14 who presents for post-hospital follow-up.   He was admitted 03/11/14 with prolonged admission after presenting with cardiac arrest. He had LOC at home and EMS was called who found him in pulseless VT. He underwent total CPR 20 minutes, defibrillation x3, and was treated with IV epinephrine, amiodarone and magnesium. He was intubated and placed on the hypothermia protocol. He continued to have recurrent VT storm and was continued on amiodarone, which was eventually changed to oral form. LHC 03/12/14 with normal cors. He wnet on to develop CHB and ultimately had placement of St. Jude BiV-ICD 03/17/14. Hospital course was complicated by circulatory shock, acute encephalopathy, AKI, hiccups which all eventually improved. 2D Echo 03/12/14: EF 15-20%, diffuse HK, mod dilated RV with severely reduced systolic function, mildly dilated RA. Cardiac MRI 03/17/14: LVEF 46% with WMA, mod dilated RV with mild-mod systolic dysfunction, pattern of LGE concerning for cardiac sarcoidosis. The patient was advised not to drive for 6 months. He was placed on prednisone 60mg  daily with plan to f/u with pulmonology 03/22/14. Per EP notes he was placed on an amio taper (400mg  BID x 2 weeks then 400mg  daily until seen by Dr. Ladona Ridgel in 3 months). Dr. Ladona Ridgel mentions that the patient has had multiple VT morphologies making ablation less likely to be helpful at least at this time.   He is here for follow-up today. He continues to improve. No residual neurologic  deficits. Denies CP, SOB, ICD discharge. He does struggle with insomnia but this is a longstanding issue. Has felt fatigued but is gradually building activity level back up. 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 Already has f/u with [facility] 04/07/14 and with Dr. Ladona Ridgel 06/23/14.  Past Medical History  Diagnosis Date  . Pulmonary sarcoidosis   . GERD (gastroesophageal reflux disease)   . Cardiac arrest     a. VT arrest with recurrent VT 03/2014, s/p CPR, multiple defib. Ultimately felt 2/2 cardiac sarcoid. Placed on amiodarone. Hospitalization complicated by circulatory shock, VDRF, encephalopathy, AKI, and hiccups which improved by discharge.  . Recurrent ventricular tachycardia   . Complete heart block     a. s/p St Jude BiV ICD/pacemaker placement w/ BS leads 03/17/14  . Cardiac sarcoidosis   . Non-ischemic cardiomyopathy     a. EF15-20% by echo 03/12/14 - s/p BiV-ICD placement. Normal cors 03/2014.  Marland Kitchen RBBB   . Acute renal insufficiency     a. During adm 03/2014 for VT arrest.    Past Surgical History  Procedure Laterality Date  . Left heart catheterization with coronary angiogram N/A 03/12/2014    Procedure: LEFT HEART CATHETERIZATION WITH CORONARY ANGIOGRAM;  Surgeon: Iran Ouch, MD;  Location: [facility] CATH LAB;  Service: Cardiovascular;  Laterality: N/A;  . Bi-ventricular implantable cardioverter defibrillator N/A 03/17/2014    Procedure: BI-VENTRICULAR IMPLANTABLE CARDIOVERTER DEFIBRILLATOR  (CRT-D);  Surgeon: Marinus Maw, MD;  Location: [facility] CATH LAB;  Service: Cardiovascular;  Laterality: N/A;    Current Outpatient Prescriptions  Medication Sig Dispense Refill  .  amiodarone (PACERONE) 400 MG tablet Take 1 tablet (400 mg total) by mouth 2 (two) times daily. 25 tablet 0  . [START ON 04/01/2014] amiodarone (PACERONE) 400 MG tablet Take 1 tablet (400 mg total) by mouth daily. 30 tablet 5  . carvedilol (COREG) 6.25 MG tablet Take 1 tablet (6.25 mg total)  by mouth 2 (two) times daily with a meal. 60 tablet 11  . predniSONE (DELTASONE) 20 MG tablet Take 3 tablets (60 mg total) by mouth daily with breakfast. 90 tablet 2  . ramipril (ALTACE) 2.5 MG capsule Take 1 capsule (2.5 mg total) by mouth daily. 30 capsule 11   No current facility-administered medications for this visit.    Allergies:   Review of patient's allergies indicates no known allergies.   Social History:  The patient  reports that he has been passively smoking.  He does not have any smokeless tobacco history on file. He reports that he does not drink alcohol.   Family History:  The patient's family history includes Hypertension in an other family member; Stroke in his father.  ROS:  Please see the history of present illness. Otherwise, review of systems is positive for insomnia. All other systems are reviewed and otherwise negative.   PHYSICAL EXAM:  VS:  BP 100/78 mmHg  Pulse 63  Ht  (1.803 m)  Wt 203 lb (92.08 kg)  BMI 28.33 kg/m2 BMI: Body mass index is 28.33 kg/(m^2). Well nourished, well developed WM, in no acute distress HEENT: normocephalic, atraumatic Neck: no JVD Cardiac:  normal S1, S2; RRR; no murmur. Left upper chest wall device in place with steristrips still in place. No significant swelling or ecchymosis. Lungs:  clear to auscultation bilaterally, no wheezing, rhonchi or rales Abd: soft, nontender, no hepatomegaly Ext: no edema. Left radial site with early resolving soft hematoma/ecchymosis, no firmness, good pulse. Skin: warm and dry Neuro:  moves all extremities spontaneously, no focal abnormalities noted  EKG:  Atrial sensed, V paced rhythm 63bpm, nonspecific T wave changes (This was ordered due to recent VT)  Recent Labs: 03/19/2014: Hemoglobin 15.0; Magnesium 2.1; Platelets 91* 03/20/2014: ALT 19; BUN 21; Creatinine 1.18; Potassium 4.1; Sodium 142  No results found for requested labs within last 365 days.   Estimated Creatinine Clearance:  86.9 mL/min (by C-G formula based on Cr of 1.18).   Wt Readings from Last 3 Encounters:  03/26/14 203 lb (92.08 kg)  03/18/14 208 lb 9.6 oz (94.62 kg)     Other studies reviewed: Additional studies/records reviewed today include: post-device implant. 2D Echo report as above. Cardiac MRI as above.  ASSESSMENT AND PLAN:  1. Cardiac arrest/VT storm complicated by acute encephalopathy, AKI, VDRF - improved since discharge. No symptomatic evidence of recurrence. Continue plan for amiodarone as outlined above per EP. Further monitoring of organ systems while on amiodarone will be at discretion of primary cardiologist. Continue BB. Advised him to please keep an eye on his catheterization site and call if bruising does not continue to resolve. It looks like it is in the early stages of disappearing. He asked if he is OK to take PRN Ambien for his insomnia. This does not appear to have any QT prolonging activity and will not interact with his amiodarone so I think this is OK. Sleep hygiene discussed. He will remain out of work until cleared by his primary cardiologist. I have also written a note to defer his upcoming jury duty given that he is restricted from driving for 6 months. 2.  NICM likely due to cardiac sarcoidosis - continue BB and ARB. BP a little soft today to titrate medications. He has been referred to the AHF clinic which I think is a great place for him to receive his care.  3. CHB s/p BiV-ICD 03/2014 - for wound check with device clinic today.  4. Pulmonary sarcoidosis - f/u pulm tomorrow. He was previously on Protonix but self-discontinued this. I told him if pulmonology decides to keep him on Prednisone then to resume Protonix for GI protection. 5. Acute kidney injury - resolved during hospital stay.  Disposition: Has f/u with [facility] 04/07/14 and with Dr. Ladona Ridgel 06/23/14 as previously scheduled.  Current medicines are reviewed at length with the patient today.  Medication concerns  addressed above.  Thomasene Mohair PA-C 03/26/2014 9:11 AM     [facility] [address] [place] [address] [phone] (office)  [phone] (fax)

## 2014-03-26 ENCOUNTER — Encounter: Payer: Self-pay | Admitting: Physician Assistant

## 2014-03-26 ENCOUNTER — Ambulatory Visit (INDEPENDENT_AMBULATORY_CARE_PROVIDER_SITE_OTHER): Payer: 59 | Admitting: Physician Assistant

## 2014-03-26 ENCOUNTER — Ambulatory Visit (INDEPENDENT_AMBULATORY_CARE_PROVIDER_SITE_OTHER): Payer: 59 | Admitting: *Deleted

## 2014-03-26 VITALS — BP 100/78 | HR 63 | Ht 71.0 in | Wt 203.0 lb

## 2014-03-26 DIAGNOSIS — D8685 Sarcoid myocarditis: Secondary | ICD-10-CM

## 2014-03-26 DIAGNOSIS — I428 Other cardiomyopathies: Secondary | ICD-10-CM

## 2014-03-26 DIAGNOSIS — I472 Ventricular tachycardia, unspecified: Secondary | ICD-10-CM

## 2014-03-26 DIAGNOSIS — I429 Cardiomyopathy, unspecified: Secondary | ICD-10-CM

## 2014-03-26 DIAGNOSIS — I442 Atrioventricular block, complete: Secondary | ICD-10-CM

## 2014-03-26 DIAGNOSIS — I469 Cardiac arrest, cause unspecified: Secondary | ICD-10-CM

## 2014-03-26 DIAGNOSIS — D860 Sarcoidosis of lung: Secondary | ICD-10-CM

## 2014-03-26 DIAGNOSIS — I4510 Unspecified right bundle-branch block: Secondary | ICD-10-CM | POA: Diagnosis not present

## 2014-03-26 DIAGNOSIS — N179 Acute kidney failure, unspecified: Secondary | ICD-10-CM

## 2014-03-26 LAB — MDC_IDC_ENUM_SESS_TYPE_INCLINIC
Battery Remaining Longevity: 52.8 mo
Brady Statistic RA Percent Paced: 62 %
Brady Statistic RV Percent Paced: 95 %
Date Time Interrogation Session: 20160120121221
HIGH POWER IMPEDANCE MEASURED VALUE: 66.375
Lead Channel Impedance Value: 412.5 Ohm
Lead Channel Impedance Value: 475 Ohm
Lead Channel Pacing Threshold Amplitude: 0.75 V
Lead Channel Pacing Threshold Amplitude: 1 V
Lead Channel Pacing Threshold Amplitude: 1.5 V
Lead Channel Pacing Threshold Pulse Width: 0.5 ms
Lead Channel Pacing Threshold Pulse Width: 0.5 ms
Lead Channel Pacing Threshold Pulse Width: 0.5 ms
Lead Channel Pacing Threshold Pulse Width: 0.5 ms
Lead Channel Sensing Intrinsic Amplitude: 12 mV
Lead Channel Sensing Intrinsic Amplitude: 5 mV
Lead Channel Setting Pacing Amplitude: 2.5 V
Lead Channel Setting Pacing Pulse Width: 0.5 ms
Lead Channel Setting Pacing Pulse Width: 0.5 ms
Lead Channel Setting Sensing Sensitivity: 2 mV
MDC IDC MSMT LEADCHNL LV PACING THRESHOLD AMPLITUDE: 1 V
MDC IDC MSMT LEADCHNL RA PACING THRESHOLD AMPLITUDE: 1.5 V
MDC IDC MSMT LEADCHNL RA PACING THRESHOLD PULSEWIDTH: 0.5 ms
MDC IDC MSMT LEADCHNL RV IMPEDANCE VALUE: 512.5 Ohm
MDC IDC MSMT LEADCHNL RV PACING THRESHOLD AMPLITUDE: 0.75 V
MDC IDC MSMT LEADCHNL RV PACING THRESHOLD PULSEWIDTH: 0.5 ms
MDC IDC PG SERIAL: 7210309
MDC IDC SET LEADCHNL LV PACING AMPLITUDE: 3.5 V
MDC IDC SET LEADCHNL RV PACING AMPLITUDE: 3.5 V
Zone Setting Detection Interval: 260 ms
Zone Setting Detection Interval: 315 ms
Zone Setting Detection Interval: 425 ms

## 2014-03-26 NOTE — Patient Instructions (Addendum)
Your physician recommends that you continue on your current medications as directed. Please refer to the Current Medication list given to you today.   IF PULMONARY DR SAYS SO RESUME TAKING PREDNISONE AND PROTONIX    FOR NOW YOUR ARE TO REMAIN OUT OF WORK UNTIL SEEN IN CHF CLINIC BY DR [facility]

## 2014-03-26 NOTE — Progress Notes (Signed)
Wound check appointment. Wound without redness or edema. Incision edges approximated, wound well healed. Normal device function. Thresholds, sensing, and impedances consistent with implant measurements. Device programmed with RV auto capture on and at 3.5V for extra safety margin until 3 month visit. Histogram distribution appropriate for patient and level of activity. No mode switches or ventricular arrhythmias noted. Patient educated about wound care, arm mobility, lifting restrictions, shock plan. ROV in 3 months with GT. Magnet given

## 2014-03-27 ENCOUNTER — Encounter: Payer: Self-pay | Admitting: Adult Health

## 2014-03-27 ENCOUNTER — Ambulatory Visit (INDEPENDENT_AMBULATORY_CARE_PROVIDER_SITE_OTHER): Payer: 59 | Admitting: Adult Health

## 2014-03-27 VITALS — BP 124/84 | HR 60 | Temp 98.1°F | Ht 71.0 in | Wt 201.6 lb

## 2014-03-27 DIAGNOSIS — D860 Sarcoidosis of lung: Secondary | ICD-10-CM

## 2014-03-27 DIAGNOSIS — D8685 Sarcoid myocarditis: Secondary | ICD-10-CM

## 2014-03-27 NOTE — Assessment & Plan Note (Signed)
Remain on Prednisone 60mg  daily until seen back in office in 1 month and then consider very slow taper.  Steroid sparing agent may need to be added at some point.  follow up Dr. Craige Cotta  In 1 month  Cont follow up with cards as planned  Please contact office for sooner follow up if symptoms do not improve or worsen or seek emergency care

## 2014-03-27 NOTE — Patient Instructions (Signed)
Remain on Prednisone 60mg  daily  Set up for CT chest for sarcoid  Follow up Dr. Craige Cotta  In 1 month as planned with PFT  Please contact office for sooner follow up if symptoms do not improve or worsen or seek emergency care

## 2014-03-27 NOTE — Assessment & Plan Note (Signed)
Advised on records request from [facility] Will set up for CT chest and PFT  follow up Dr. Craige Cotta  In 4 weeks and As needed   Cont on steroids

## 2014-03-27 NOTE — Progress Notes (Signed)
   Subjective:    Patient ID: Christopher Patrick, male    DOB: 1965-09-21, 49 y.o.   MRN: [id number]  HPI Christopher Patrick is a 49 y.o. male with history of pulmonary sarcoidosis (by biopsy per patient), recently admitted  03/11/14 VT arrest, cardiac sarcoid by MRI, CHB s/p ICD   03/27/2014 Post Hospital follow up  Patient presents for a post hospital follow-up He was admitted 03/11/14 for cardiac arrest with CPR  x15 min >ROSC. Placed on Hypothermia protocol.  Hospital course was complicated by circulatory shock, acute encephalopathy, AKI, hiccups which all eventually improved. 2D Echo 03/12/14: EF 15-20%, diffuse HK, mod dilated RV with severely reduced systolic function, mildly dilated RA. Cardiac MRI 03/17/14: LVEF 46% with WMA, mod dilated RV with mild-mod systolic dysfunction, pattern of LGE concerning for cardiac sarcoidosis.  BIV ICD placed on 1/11/ for CHB .  Placed on Amio, coreg and ACE per cards.  Prior to admit followed by [facility] for Sarcoid.  Prev bx dx sarcoid per pt.  Since discharge he is feeling better but weak.  Has mild dry cough but intermittent.  No rash or visual changes.  No syncope or palpitations.  Never smoker. 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        Review of Systems Constitutional:   No  weight loss, night sweats,  Fevers, chills,  +fatigue, or  lassitude.  HEENT:   No headaches,  Difficulty swallowing,  Tooth/dental problems, or  Sore throat,                No sneezing, itching, ear ache, nasal congestion, post nasal drip,   CV:  No chest pain,  Orthopnea, PND, swelling in lower extremities, anasarca, dizziness, palpitations, syncope.   GI  No heartburn, indigestion, abdominal pain, nausea, vomiting, diarrhea, change in bowel habits, loss of appetite, bloody stools.   Resp: No shortness of breath with exertion or at rest.  No excess mucus, no productive cough,  No non-productive cough,  No coughing up of blood.  No change in color of  mucus.  No wheezing.  No chest wall deformity  Skin: no rash or lesions.  GU: no dysuria, change in color of urine, no urgency or frequency.  No flank pain, no hematuria   MS:  No joint pain or swelling.  No decreased range of motion.  No back pain.  Psych:  No change in mood or affect. No depression or anxiety.  No memory loss.         Objective:   Physical Exam GEN: A/Ox3; pleasant , NAD, well nourished   HEENT:  [place]/AT,  EACs-clear, TMs-wnl, NOSE-clear, THROAT-clear, no lesions, no postnasal drip or exudate noted.   NECK:  Supple w/ fair ROM; no JVD; normal carotid impulses w/o bruits; no thyromegaly or nodules palpated; no lymphadenopathy.  RESP  Clear  P & A; w/o, wheezes/ rales/ or rhonchi.no accessory muscle use, no dullness to percussion  CARD:  RRR, no m/r/g  , no peripheral edema, pulses intact, no cyanosis or clubbing.  GI:   Soft & nt; nml bowel sounds; no organomegaly or masses detected.  Musco: Warm bil, no deformities or joint swelling noted.   Neuro: alert, no focal deficits noted.    Skin: Warm, no lesions or rashes         Assessment & Plan:

## 2014-03-31 ENCOUNTER — Other Ambulatory Visit: Payer: Self-pay | Admitting: Physician Assistant

## 2014-03-31 NOTE — Progress Notes (Signed)
Reviewed.  Will need to determine if his plan for follow up is with [facility] in [place] or to continue f/u in [facility].

## 2014-04-07 ENCOUNTER — Encounter (HOSPITAL_COMMUNITY): Payer: Self-pay | Admitting: *Deleted

## 2014-04-07 ENCOUNTER — Ambulatory Visit (HOSPITAL_COMMUNITY)
Admission: RE | Admit: 2014-04-07 | Discharge: 2014-04-07 | Disposition: A | Discharge status: Home / Self Care | Payer: 59 | Source: Ambulatory Visit | Attending: Cardiology | Admitting: Cardiology

## 2014-04-07 VITALS — BP 142/82 | HR 60 | Wt 194.2 lb

## 2014-04-07 DIAGNOSIS — Z7952 Long term (current) use of systemic steroids: Secondary | ICD-10-CM | POA: Diagnosis not present

## 2014-04-07 DIAGNOSIS — D860 Sarcoidosis of lung: Secondary | ICD-10-CM | POA: Diagnosis not present

## 2014-04-07 DIAGNOSIS — K219 Gastro-esophageal reflux disease without esophagitis: Secondary | ICD-10-CM | POA: Diagnosis not present

## 2014-04-07 DIAGNOSIS — I429 Cardiomyopathy, unspecified: Secondary | ICD-10-CM

## 2014-04-07 DIAGNOSIS — Z79899 Other long term (current) drug therapy: Secondary | ICD-10-CM | POA: Insufficient documentation

## 2014-04-07 DIAGNOSIS — I5022 Chronic systolic (congestive) heart failure: Secondary | ICD-10-CM | POA: Insufficient documentation

## 2014-04-07 DIAGNOSIS — I428 Other cardiomyopathies: Secondary | ICD-10-CM

## 2014-04-07 DIAGNOSIS — D8685 Sarcoid myocarditis: Secondary | ICD-10-CM

## 2014-04-07 DIAGNOSIS — I472 Ventricular tachycardia, unspecified: Secondary | ICD-10-CM

## 2014-04-07 DIAGNOSIS — I442 Atrioventricular block, complete: Secondary | ICD-10-CM | POA: Insufficient documentation

## 2014-04-07 DIAGNOSIS — Z95810 Presence of automatic (implantable) cardiac defibrillator: Secondary | ICD-10-CM | POA: Insufficient documentation

## 2014-04-07 MED ORDER — RAMIPRIL 5 MG PO CAPS: 5.0000 mg | ORAL_CAPSULE | Freq: Every day | ORAL | Status: DC

## 2014-04-07 MED ORDER — ZOLPIDEM TARTRATE ER 6.25 MG PO TBCR
6.2500 mg | EXTENDED_RELEASE_TABLET | Freq: Every evening | ORAL | Status: DC | PRN
Start: 2014-04-07 — End: 2014-04-15

## 2014-04-07 NOTE — Progress Notes (Signed)
Patient ID: Christopher Patrick, male   DOB: Dec 02, 1965, 49 y.o.   MRN: [id number] PCP: Dr. Leonette Most  49 yo with history of pulmonary sarcoidosis was admitted to [facility] in 1/16 after a VT arrest with 20 minutes CPR and defibrillation x 3.  He had therapeutic hypothermia.  He had good neurologic recovery.  He developed VT storm and ended up getting St Jude BiV ICD and starting amiodarone.  LHC showed no significant coronary disease.  Echo initially after arrest showed EF 15-20%, but cardiac MRI a week or so later showed EF 46% with LGE pattern concerning for cardiac sarcoidosis.  Patient also had complete heart block (treated with St Jude BiV ICD).  The cause of VT and CHB was thought to be cardiac sarcoidosis.  He was started on prednisone 60 mg daily and is following with pulmonary (Dr Craige Cotta).    He has been doing generally well since discharge.  He has some fatigue and has a hard time sleeping through the night.  He denies exertional dyspnea and can walk up a flight of steps without problems.  He has had some chest wall soreness since he had CPR.  No tachypalpitations, lightheadedness, or recurrent syncope.  No ICD discharges.  He is walking daily for exercise.   Labs (1/16): K 4.1, creatinine 1.18, LFTs normal, HCT 42.5, plts 91K  PMH: 1. Pulmonary sarcoidosis: Diagnosed around 2011.   2. 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   - Echo (1/16) with EF 15-20%, diffuse hypokinesis, RV moderately dilated with severe decreased systolic function (initially post-cardiac arrest).  - Cardiac MRI (1/16) with EF 46%, basal to mid anteroseptal and apical inferior hypokinesis, RV moderately dilated with mild to moderately decreased systolic function, RV EF 33%, patchy LGE in a pattern concerning for cardiac sarcoidosis.  3. Ventricular tachycardia: Admission 1/16 with cardiac arrest and VT storm. Suspect due to cardiac sarcoidosis.  - LHC (1/16) with normal coronaries.  - St Jude BiV  ICD placed, amiodarone started.  4. Complete heart block: Likely due to cardiac sarcoidosis.  St Jude BiV ICD placed.  5. GERD  SH: Emergency planning/management officer, lives in [place], nonsmoker, married  FH: Grandfather with CHF  ROS: All systems reviewed and negative except as per HPI.   Current Outpatient Prescriptions  Medication Sig Dispense Refill  . amiodarone (PACERONE) 400 MG tablet Take 1 tablet (400 mg total) by mouth daily. 30 tablet 5  . carvedilol (COREG) 6.25 MG tablet Take 1 tablet (6.25 mg total) by mouth 2 (two) times daily with a meal. 60 tablet 11  . pantoprazole (PROTONIX) 40 MG tablet Take 40 mg by mouth 2 (two) times daily.    . predniSONE (DELTASONE) 20 MG tablet Take 3 tablets (60 mg total) by mouth daily with breakfast. 90 tablet 2  . ramipril (ALTACE) 5 MG capsule Take 1 capsule (5 mg total) by mouth daily. 30 capsule 3  . zolpidem (AMBIEN CR) 6.25 MG CR tablet Take 1 tablet (6.25 mg total) by mouth at bedtime as needed for sleep. 30 tablet 3   No current facility-administered medications for this encounter.   BP 142/82 mmHg  Pulse 60  Wt 194 lb 4 oz (88.111 kg)  SpO2 97% General: NAD Neck: No JVD, no thyromegaly or thyroid nodule.  Lungs: Clear to auscultation bilaterally with normal respiratory effort. CV: Nondisplaced PMI.  Heart regular S1/S2, no S3/S4, no murmur.  No peripheral edema.  No carotid bruit.  Normal pedal pulses.  Abdomen:  Soft, nontender, no hepatosplenomegaly, no distention.  Skin: Intact without lesions or rashes.  Neurologic: Alert and oriented x 3.  Psych: Normal affect. Extremities: No clubbing or cyanosis.  HEENT: Normal.   Assessment/Plan: 1. Cardiac sarcoidosis: I suspect this is the etiology of cardiomyopathy, VT, and complete heart block.  He is on prednisone 60 mg daily. He will need a very slow taper of this over a year or so.  This will be followed by Dr. Craige Cotta.  We will not be able to do another MRI on him but will follow LV function by echo.   2. Chronic systolic CHF: Initial EF 15-20% in hospital, suspect this was due to stunning from cardiac arrest.  Cardiac MRI a week or so later showed LV EF 46% with moderate RV systolic dysfunction.  He looks euvolemic on exam today.  NYHA class I-II symptoms.  - Continue current Coreg - Increase ramipril to 5 mg daily with BMET in 2 wks.  3. VT: Has ICD, on amiodarone.  Recent LFTs normal.  Will need to check TSH.  Will need at least yearly eye exam while on amiodarone. Would like to decrease amiodarone to 200 mg daily if he remains stable.  Finally, no driving for 6 months (until 7/16) given cardiac arrest with syncope.   4. Complete heart block: St Jude CRT, to be followed by Dr Ladona Ridgel.   Marca Ancona 04/07/2014

## 2014-04-07 NOTE — Patient Instructions (Signed)
Increase Ramipril to 5 mg daily, we have sent in a new prescription for 5 mg capsules   Labs in 2 weeks (bmet, tsh)  We will contact you in 3 months to schedule your next appointment.

## 2014-04-11 ENCOUNTER — Encounter: Payer: Self-pay | Admitting: Internal Medicine

## 2014-04-14 ENCOUNTER — Telehealth (HOSPITAL_COMMUNITY): Payer: Self-pay | Admitting: Vascular Surgery

## 2014-04-14 NOTE — Telephone Encounter (Signed)
ok 

## 2014-04-14 NOTE — Telephone Encounter (Signed)
Pt cqlled he needs a clearance letter for light duty.. Please advise

## 2014-04-14 NOTE — Telephone Encounter (Signed)
Will send to Dr McLean for review 

## 2014-04-15 ENCOUNTER — Encounter: Payer: Self-pay | Admitting: Internal Medicine

## 2014-04-15 ENCOUNTER — Encounter (HOSPITAL_COMMUNITY): Payer: Self-pay | Admitting: *Deleted

## 2014-04-15 ENCOUNTER — Telehealth (HOSPITAL_COMMUNITY): Payer: Self-pay | Admitting: *Deleted

## 2014-04-15 MED ORDER — ZOLPIDEM TARTRATE 10 MG PO TABS: 10.0000 mg | ORAL_TABLET | Freq: Every evening | ORAL | Status: AC | PRN

## 2014-04-15 NOTE — Telephone Encounter (Signed)
Completed PA form from [employer] for pt's Ambien cr, however received letter back from them stating product is a plan exclusion and pt needs to contact member services for further assistance, called and spoke w/pt he states he will just continue the Ambien 10 mg he has now

## 2014-04-15 NOTE — Telephone Encounter (Signed)
Pt states he needs letter stating he can not drive for 6 months and that he should remain on light duty, note written and faxed to [phone] [employer]

## 2014-04-20 ENCOUNTER — Emergency Department (HOSPITAL_COMMUNITY): Payer: 59

## 2014-04-20 ENCOUNTER — Observation Stay (HOSPITAL_COMMUNITY)
Admission: EM | Admit: 2014-04-20 | Discharge: 2014-04-21 | Disposition: A | Discharge status: Home / Self Care | Payer: 59 | Attending: Internal Medicine | Admitting: Internal Medicine

## 2014-04-20 ENCOUNTER — Encounter (HOSPITAL_COMMUNITY): Payer: Self-pay

## 2014-04-20 DIAGNOSIS — R61 Generalized hyperhidrosis: Secondary | ICD-10-CM | POA: Insufficient documentation

## 2014-04-20 DIAGNOSIS — R55 Syncope and collapse: Principal | ICD-10-CM | POA: Insufficient documentation

## 2014-04-20 DIAGNOSIS — Z9889 Other specified postprocedural states: Secondary | ICD-10-CM | POA: Diagnosis not present

## 2014-04-20 DIAGNOSIS — Z79899 Other long term (current) drug therapy: Secondary | ICD-10-CM | POA: Diagnosis not present

## 2014-04-20 DIAGNOSIS — Z7952 Long term (current) use of systemic steroids: Secondary | ICD-10-CM | POA: Insufficient documentation

## 2014-04-20 DIAGNOSIS — I472 Ventricular tachycardia: Secondary | ICD-10-CM | POA: Insufficient documentation

## 2014-04-20 DIAGNOSIS — D8685 Sarcoid myocarditis: Secondary | ICD-10-CM | POA: Insufficient documentation

## 2014-04-20 DIAGNOSIS — I469 Cardiac arrest, cause unspecified: Secondary | ICD-10-CM | POA: Insufficient documentation

## 2014-04-20 DIAGNOSIS — H538 Other visual disturbances: Secondary | ICD-10-CM | POA: Insufficient documentation

## 2014-04-20 DIAGNOSIS — N289 Disorder of kidney and ureter, unspecified: Secondary | ICD-10-CM | POA: Diagnosis not present

## 2014-04-20 DIAGNOSIS — K219 Gastro-esophageal reflux disease without esophagitis: Secondary | ICD-10-CM | POA: Diagnosis not present

## 2014-04-20 DIAGNOSIS — I4510 Unspecified right bundle-branch block: Secondary | ICD-10-CM | POA: Diagnosis not present

## 2014-04-20 DIAGNOSIS — I442 Atrioventricular block, complete: Secondary | ICD-10-CM | POA: Diagnosis not present

## 2014-04-20 DIAGNOSIS — I429 Cardiomyopathy, unspecified: Secondary | ICD-10-CM | POA: Diagnosis not present

## 2014-04-20 DIAGNOSIS — D860 Sarcoidosis of lung: Secondary | ICD-10-CM | POA: Diagnosis not present

## 2014-04-20 DIAGNOSIS — R531 Weakness: Secondary | ICD-10-CM | POA: Insufficient documentation

## 2014-04-20 DIAGNOSIS — R42 Dizziness and giddiness: Secondary | ICD-10-CM

## 2014-04-20 LAB — COMPREHENSIVE METABOLIC PANEL
ALT: 19 U/L (ref 0–53)
AST: 17 U/L (ref 0–37)
Albumin: 3.4 g/dL — ABNORMAL LOW (ref 3.5–5.2)
Alkaline Phosphatase: 58 U/L (ref 39–117)
Anion gap: 10 (ref 5–15)
BUN: 16 mg/dL (ref 6–23)
CO2: 22 mmol/L (ref 19–32)
Calcium: 9.2 mg/dL (ref 8.4–10.5)
Chloride: 105 mmol/L (ref 96–112)
Creatinine, Ser: 1.19 mg/dL (ref 0.50–1.35)
GFR calc non Af Amer: 70 mL/min — ABNORMAL LOW (ref >90)
GFR, EST AFRICAN AMERICAN: 81 mL/min — AB (ref >90)
GLUCOSE: 106 mg/dL — AB (ref 70–99)
Potassium: 3.8 mmol/L (ref 3.5–5.1)
SODIUM: 137 mmol/L (ref 135–145)
Total Bilirubin: 1.2 mg/dL (ref 0.3–1.2)
Total Protein: 5.5 g/dL — ABNORMAL LOW (ref 6.0–8.3)

## 2014-04-20 LAB — I-STAT TROPONIN, ED: Troponin i, poc: 0 ng/mL (ref 0.00–0.08)

## 2014-04-20 LAB — URINALYSIS, ROUTINE W REFLEX MICROSCOPIC
BILIRUBIN URINE: NEGATIVE
GLUCOSE, UA: NEGATIVE mg/dL
Hgb urine dipstick: NEGATIVE
KETONES UR: NEGATIVE mg/dL
Leukocytes, UA: NEGATIVE
Nitrite: NEGATIVE
PH: 7.5 (ref 5.0–8.0)
Protein, ur: NEGATIVE mg/dL
Specific Gravity, Urine: 1.012 (ref 1.005–1.030)
Urobilinogen, UA: 0.2 mg/dL (ref 0.0–1.0)

## 2014-04-20 LAB — CBC
HCT: 42.5 % (ref 39.0–52.0)
Hemoglobin: 14.7 g/dL (ref 13.0–17.0)
MCH: 30.8 pg (ref 26.0–34.0)
MCHC: 34.6 g/dL (ref 30.0–36.0)
MCV: 88.9 fL (ref 78.0–100.0)
PLATELETS: 98 10*3/uL — AB (ref 150–400)
RBC: 4.78 MIL/uL (ref 4.22–5.81)
RDW: 14.3 % (ref 11.5–15.5)
WBC: 12.9 10*3/uL — ABNORMAL HIGH (ref 4.0–10.5)

## 2014-04-20 LAB — D-DIMER, QUANTITATIVE (NOT AT ARMC): D-Dimer, Quant: 0.91 ug/mL-FEU — ABNORMAL HIGH (ref 0.00–0.48)

## 2014-04-20 MED ORDER — AMIODARONE HCL 200 MG PO TABS
400.0000 mg | ORAL_TABLET | Freq: Every day | ORAL | Status: DC
  Administered 2014-04-21: 400 mg via ORAL
  Filled 2014-04-20: qty 2

## 2014-04-20 MED ORDER — RAMIPRIL 2.5 MG PO CAPS
2.5000 mg | ORAL_CAPSULE | Freq: Every day | ORAL | Status: DC
  Administered 2014-04-21: 2.5 mg via ORAL
  Filled 2014-04-20: qty 1

## 2014-04-20 MED ORDER — ENOXAPARIN SODIUM 40 MG/0.4ML ~~LOC~~ SOLN
40.0000 mg | SUBCUTANEOUS | Status: DC
  Filled 2014-04-20: qty 0.4

## 2014-04-20 MED ORDER — PREDNISONE 20 MG PO TABS
60.0000 mg | ORAL_TABLET | Freq: Every day | ORAL | Status: DC
  Administered 2014-04-21: 60 mg via ORAL
  Filled 2014-04-20: qty 3

## 2014-04-20 MED ORDER — ONDANSETRON HCL 4 MG/2ML IJ SOLN: 4.0000 mg | Freq: Four times a day (QID) | INTRAMUSCULAR | Status: DC | PRN

## 2014-04-20 MED ORDER — NITROGLYCERIN 0.4 MG SL SUBL: 0.4000 mg | SUBLINGUAL_TABLET | SUBLINGUAL | Status: DC | PRN

## 2014-04-20 MED ORDER — PANTOPRAZOLE SODIUM 40 MG PO TBEC
40.0000 mg | DELAYED_RELEASE_TABLET | Freq: Two times a day (BID) | ORAL | Status: DC
  Administered 2014-04-20 – 2014-04-21 (×2): 40 mg via ORAL
  Filled 2014-04-20 (×2): qty 1

## 2014-04-20 MED ORDER — CARVEDILOL 6.25 MG PO TABS
6.2500 mg | ORAL_TABLET | Freq: Two times a day (BID) | ORAL | Status: DC
  Administered 2014-04-20 – 2014-04-21 (×2): 6.25 mg via ORAL
  Filled 2014-04-20 (×2): qty 1

## 2014-04-20 MED ORDER — ACETAMINOPHEN 325 MG PO TABS: 650.0000 mg | ORAL_TABLET | ORAL | Status: DC | PRN

## 2014-04-20 NOTE — ED Provider Notes (Signed)
CSN: [id number]     Arrival date & time 04/20/14  1124 History   First MD Initiated Contact with Patient 04/20/14 1154     Chief Complaint  Patient presents with  . Loss of Consciousness     (Consider location/radiation/quality/duration/timing/severity/associated sxs/prior Treatment) Patient is a 49 y.o. male presenting with syncope. The history is provided by the patient and the EMS personnel.  Loss of Consciousness Associated symptoms: diaphoresis and weakness   Associated symptoms: no chest pain, no confusion, no fever, no nausea, no shortness of breath and no vomiting    patient brought in by EMS. Patient was at the house shaving felt lightheaded I started to gray. Patient had cold sweats. Patient made it to the bed did not completely pass out. Patient was orthostatic when EMS arrived. Patient had a defibrillator pacemaker placed in January has a diagnosis of sarcoidosis felt to be involving the heart. Patient stated that no chest pain no shortness of breath defibrillator did not fire. Patient feels completely fine now. Patient followed by cardiology.  Past Medical History  Diagnosis Date  . Pulmonary sarcoidosis   . GERD (gastroesophageal reflux disease)   . Cardiac arrest     a. VT arrest with recurrent VT 03/2014, s/p CPR, multiple defib. Ultimately felt 2/2 cardiac sarcoid. Placed on amiodarone. Hospitalization complicated by circulatory shock, VDRF, encephalopathy, AKI, and hiccups which improved by discharge.  . Recurrent ventricular tachycardia   . Complete heart block     a. s/p St Jude BiV ICD/pacemaker placement w/ BS leads 03/17/14  . Cardiac sarcoidosis   . Non-ischemic cardiomyopathy     a. EF15-20% by echo 03/12/14 - s/p BiV-ICD placement. Normal cors 03/2014.  Marland Kitchen RBBB   . Acute renal insufficiency     a. During adm 03/2014 for VT arrest.   Past Surgical History  Procedure Laterality Date  . Left heart catheterization with coronary angiogram N/A 03/12/2014    Procedure:  LEFT HEART CATHETERIZATION WITH CORONARY ANGIOGRAM;  Surgeon: Iran Ouch, MD;  Location: [facility] CATH LAB;  Service: Cardiovascular;  Laterality: N/A;  . Bi-ventricular implantable cardioverter defibrillator N/A 03/17/2014    Procedure: BI-VENTRICULAR IMPLANTABLE CARDIOVERTER DEFIBRILLATOR  (CRT-D);  Surgeon: Marinus Maw, MD;  Location: [facility] CATH LAB;  Service: Cardiovascular;  Laterality: N/A;   Family History  Problem Relation Age of Onset  . Hypertension    . Stroke Father    History  Substance Use Topics  . Smoking status: Passive Smoke Exposure - Never Smoker  . Smokeless tobacco: Never Used     Comment: second hand as child  . Alcohol Use: No    Review of Systems  Constitutional: Positive for diaphoresis. Negative for fever.  HENT: Negative for congestion.   Eyes: Positive for visual disturbance.  Respiratory: Negative for shortness of breath.   Cardiovascular: Positive for syncope. Negative for chest pain.  Gastrointestinal: Negative for nausea, vomiting and abdominal pain.  Genitourinary: Negative for dysuria.  Skin: Negative for rash.  Neurological: Positive for weakness and light-headedness. Negative for syncope.  Hematological: Does not bruise/bleed easily.  Psychiatric/Behavioral: Negative for confusion.      Allergies  Benadryl  Home Medications   Prior to Admission medications   Medication Sig Start Date End Date Taking? Authorizing Provider  amiodarone (PACERONE) 400 MG tablet Take 1 tablet (400 mg total) by mouth daily. 04/01/14  Yes Janetta Hora, PA-C  carvedilol (COREG) 6.25 MG tablet Take 1 tablet (6.25 mg total) by mouth 2 (two) times daily  with a meal. 03/20/14  Yes Janetta Hora, PA-C  pantoprazole (PROTONIX) 40 MG tablet Take 40 mg by mouth 2 (two) times daily.   Yes Historical Provider, MD  predniSONE (DELTASONE) 20 MG tablet Take 3 tablets (60 mg total) by mouth daily with breakfast. 03/20/14  Yes Janetta Hora, PA-C  ramipril  (ALTACE) 5 MG capsule Take 1 capsule (5 mg total) by mouth daily. 04/07/14  Yes Laurey Morale, MD  zolpidem (AMBIEN) 10 MG tablet Take 1 tablet (10 mg total) by mouth at bedtime as needed for sleep. 04/15/14 05/15/14 Yes Laurey Morale, MD   BP 116/81 mmHg  Pulse 60  Temp(Src) 98.5 F (36.9 C) (Oral)  Resp 23  SpO2 98% Physical Exam  Constitutional: He is oriented to person, place, and time. He appears well-developed and well-nourished. No distress.  HENT:  Head: Normocephalic and atraumatic.  Mouth/Throat: Oropharynx is clear and moist.  Eyes: Conjunctivae and EOM are normal. Pupils are equal, round, and reactive to light.  Neck: Normal range of motion.  Cardiovascular: Normal rate, regular rhythm and normal heart sounds.   No murmur heard. Pulmonary/Chest: Effort normal and breath sounds normal. No respiratory distress.  Abdominal: Soft. Bowel sounds are normal. There is no tenderness.  Musculoskeletal: Normal range of motion.  Neurological: He is alert and oriented to person, place, and time. No cranial nerve deficit. He exhibits normal muscle tone. Coordination normal.  Skin: Skin is warm. No rash noted.  Nursing note and vitals reviewed.   ED Course  Procedures (including critical care time) Labs Review Labs Reviewed  CBC - Abnormal; Notable for the following:    WBC 12.9 (*)    Platelets 98 (*)    All other components within normal limits  COMPREHENSIVE METABOLIC PANEL - Abnormal; Notable for the following:    Glucose, Bld 106 (*)    Total Protein 5.5 (*)    Albumin 3.4 (*)    GFR calc non Af Amer 70 (*)    GFR calc Af Amer 81 (*)    All other components within normal limits  URINALYSIS, ROUTINE W REFLEX MICROSCOPIC  CBC  CREATININE, SERUM  D-DIMER, QUANTITATIVE  CBG MONITORING, ED  I-STAT TROPOININ, ED   Results for orders placed or performed during the hospital encounter of 04/20/14  CBC  Result Value Ref Range   WBC 12.9 (H) 4.0 - 10.5 K/uL   RBC 4.78 4.22 -  5.81 MIL/uL   Hemoglobin 14.7 13.0 - 17.0 g/dL   HCT 97.9 89.2 - 11.9 %   MCV 88.9 78.0 - 100.0 fL   MCH 30.8 26.0 - 34.0 pg   MCHC 34.6 30.0 - 36.0 g/dL   RDW 41.7 40.8 - 14.4 %   Platelets 98 (L) 150 - 400 K/uL  Comprehensive metabolic panel  Result Value Ref Range   Sodium 137 135 - 145 mmol/L   Potassium 3.8 3.5 - 5.1 mmol/L   Chloride 105 96 - 112 mmol/L   CO2 22 19 - 32 mmol/L   Glucose, Bld 106 (H) 70 - 99 mg/dL   BUN 16 6 - 23 mg/dL   Creatinine, Ser 8.18 0.50 - 1.35 mg/dL   Calcium 9.2 8.4 - 56.3 mg/dL   Total Protein 5.5 (L) 6.0 - 8.3 g/dL   Albumin 3.4 (L) 3.5 - 5.2 g/dL   AST 17 0 - 37 U/L   ALT 19 0 - 53 U/L   Alkaline Phosphatase 58 39 - 117 U/L  Total Bilirubin 1.2 0.3 - 1.2 mg/dL   GFR calc non Af Amer 70 (L) >90 mL/min   GFR calc Af Amer 81 (L) >90 mL/min   Anion gap 10 5 - 15  Urinalysis, Routine w reflex microscopic  Result Value Ref Range   Color, Urine YELLOW YELLOW   APPearance CLEAR CLEAR   Specific Gravity, Urine 1.012 1.005 - 1.030   pH 7.5 5.0 - 8.0   Glucose, UA NEGATIVE NEGATIVE mg/dL   Hgb urine dipstick NEGATIVE NEGATIVE   Bilirubin Urine NEGATIVE NEGATIVE   Ketones, ur NEGATIVE NEGATIVE mg/dL   Protein, ur NEGATIVE NEGATIVE mg/dL   Urobilinogen, UA 0.2 0.0 - 1.0 mg/dL   Nitrite NEGATIVE NEGATIVE   Leukocytes, UA NEGATIVE NEGATIVE  I-Stat Troponin, ED (not at [facility])  Result Value Ref Range   Troponin i, poc 0.00 0.00 - 0.08 ng/mL   Comment 3            Imaging Review Dg Chest Port 1 View  04/20/2014   CLINICAL DATA:  Near syncope.  Pacer placed 1 month prior.  EXAM: PORTABLE CHEST - 1 VIEW  COMPARISON:  03/18/2014  FINDINGS: Left-sided AICD device is present, leads are stable in position. Stable enlarged cardiac and mediastinal contours. No consolidative or nodular pulmonary opacities. No pleural effusion or pneumothorax.  IMPRESSION: No acute cardiopulmonary process.   Electronically Signed   By: Annia Belt M.D.   On: 04/20/2014 13:21      EKG Interpretation   Date/Time:  Sunday April 20 2014 11:34:21 EST Ventricular Rate:  60 PR Interval:  174 QRS Duration: 162 QT Interval:  509 QTC Calculation: 509 R Axis:   -123 Text Interpretation:  Sinus rhythm Ventricular premature complex Right  bundle branch block Anterior infarct, old Lateral leads are also involved  VENTRICULAR PACED RHYTHM Confirmed by Deretha Emory  MD, Royetta Probus [phone]) on  04/20/2014 11:40:37 AM      MDM   Final diagnoses:  Syncope    Patient with near-syncopal episode associated with some hypotension this morning shortly before arrival. Patient has a St. Jude pacemaker and defibrillator. It did not fire. We interrogated the device and had no significant abnormalities. Patient has been completely asymptomatic since arrival. Consult cardiology. Cardiology seen the patient recommends overnight admission and observation will get a d-dimer to rule out the possibility of PE. But they feel the patient needs observation either way. Patient's lab workup without significant abnormalities here today. No significant electrolyte abnormalities no anemia. No hypotension episodes while in the ED.   Vanetta Mulders, MD 04/20/14 [phone]

## 2014-04-20 NOTE — ED Notes (Signed)
Cardiologist at the bedside

## 2014-04-20 NOTE — H&P (Addendum)
History and Physical   Admit date: 04/20/2014 Name:  Christopher Patrick Medical record number: [id number] DOB/Age:  04/08/1965  49 y.o. male  Referring Physician:   [facility]  Primary Cardiologist:  Dr. Sharrell Ku  Chief complaint/reason for admission:   presyncope   HPI:   this very nice 49 year old male was recently admitted with a VT arrest and shock about a month ago.  He had recurrent ventricular tachycardia and heart block and was found to have cardiac sarcoidosis with normal coronary arteries.  His ejection fraction was low and he was discharged on treatment with steroids as well as a biventricular ICD pacemaker.  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  About 2 weeks ago his ramipril dose was escalated to 5 mg.  He woke this morning ate breakfast and was feeling fine and was standing shaving at the sink when he developed profuse sweating, diaphoresis, felt faint and felt as if things were leaving him.  He had to be assisted over toward the chair and EMS was called.  Blood pressure was somewhat low when they arrived it was lower when he stood up and he was transported to the emergency room.  Interrogation of his defibrillator did not show any significant cardiac arrhythmias and he has felt better since he was here.  He has complained of some shoulder pain since the defibrillator insertion.  He denies angina and has no PND, orthopnea or edema.     Past Medical History  Diagnosis Date  . Pulmonary sarcoidosis   . GERD (gastroesophageal reflux disease)   . Cardiac arrest     a. VT arrest with recurrent VT 03/2014, s/p CPR, multiple defib. Ultimately felt 2/2 cardiac sarcoid. Placed on amiodarone. Hospitalization complicated by circulatory shock, VDRF, encephalopathy, AKI, and hiccups which improved by discharge.  . Recurrent ventricular tachycardia   . Complete heart block     a. s/p St Jude BiV ICD/pacemaker  placement w/ BS leads 03/17/14  . Cardiac sarcoidosis   . Non-ischemic cardiomyopathy     a. EF15-20% by echo 03/12/14 - s/p BiV-ICD placement. Normal cors 03/2014.  Marland Kitchen RBBB   . Acute renal insufficiency     a. During adm 03/2014 for VT arrest.    Past Surgical History  Procedure Laterality Date  . Left heart catheterization with coronary angiogram N/A 03/12/2014    Procedure: LEFT HEART CATHETERIZATION WITH CORONARY ANGIOGRAM;  Surgeon: Iran Ouch, MD;  Location: [facility] CATH LAB;  Service: Cardiovascular;  Laterality: N/A;  . Bi-ventricular implantable cardioverter defibrillator N/A 03/17/2014    Procedure: BI-VENTRICULAR IMPLANTABLE CARDIOVERTER DEFIBRILLATOR  (CRT-D);  Surgeon: Marinus Maw, MD;  Location: [facility] CATH LAB;  Service: Cardiovascular;  Laterality: N/A;   Allergies: is allergic to benadryl.   Medications: Prior to Admission medications   Medication Sig Start Date End Date Taking? Authorizing Provider  amiodarone (PACERONE) 400 MG tablet Take 1 tablet (400 mg total) by mouth daily. 04/01/14  Yes Janetta Hora, PA-C  carvedilol (COREG) 6.25 MG tablet Take 1 tablet (6.25 mg total) by mouth 2 (two) times daily with a meal. 03/20/14  Yes Janetta Hora, PA-C  pantoprazole (PROTONIX) 40 MG tablet Take 40 mg by mouth 2 (two) times daily.   Yes Historical Provider, MD  predniSONE (DELTASONE) 20 MG tablet Take 3 tablets (60 mg total) by mouth daily with breakfast. 03/20/14  Yes Janetta Hora, PA-C  ramipril (ALTACE)  5 MG capsule Take 1 capsule (5 mg total) by mouth daily. 04/07/14  Yes Laurey Morale, MD  zolpidem (AMBIEN) 10 MG tablet Take 1 tablet (10 mg total) by mouth at bedtime as needed for sleep. 04/15/14 05/15/14 Yes Laurey Morale, MD    Family History:  Family Status  Relation Status Death Age  . Mother Alive   . Father Deceased     Social History:   reports that he has been passively smoking.  He has never used smokeless tobacco. He reports that he does not  drink alcohol or use illicit drugs.   History   Social History Narrative     Review of Systems:  Other than as noted above, the remainder of the review of systems is normal  Physical Exam: BP 116/81 mmHg  Pulse 60  Temp(Src) 98.5 F (36.9 C) (Oral)  Resp 23  SpO2 98% General appearance: Pleasant white male in no acute distress, somewhat moon faced  Head: Normocephalic, without obvious abnormality, atraumatic Eyes: conjunctivae/corneas clear. PERRL, EOM's intact. Fundi not examined Neck:  no adenopathy, no carotid bruit, no JVD and supple, symmetrical, trachea midline Lungs: clear to auscultation bilaterally Heart: regular rate and rhythm, S1, S2 normal, no murmur, click, rub or gallop Abdomen: soft, non-tender; bowel sounds normal; no masses,  no organomegaly Rectal: deferred Extremities: extremities normal, atraumatic, no cyanosis or edema Pulses: 2+ and symmetric Neurologic: Grossly normal   Labs: CBC  Recent Labs  04/20/14 1200  WBC 12.9*  RBC 4.78  HGB 14.7  HCT 42.5  PLT 98*  MCV 88.9  MCH 30.8  MCHC 34.6  RDW 14.3   CMP   Recent Labs  04/20/14 1200  NA 137  K 3.8  CL 105  CO2 22  GLUCOSE 106*  BUN 16  CREATININE 1.19  CALCIUM 9.2  PROT 5.5*  ALBUMIN 3.4*  AST 17  ALT 19  ALKPHOS 58  BILITOT 1.2  GFRNONAA 70*  GFRAA 81*    EKG: Sinus with paced rhythm  Radiology: Cardiomegaly, clear lung fields   IMPRESSIONS: 1.  Presyncopal episode associated with hypotension that may have been a combined effect of his other medications.  Need to be sure that he has not had a pulmonary embolus or pericardial effusion following his previous device insertion 2.  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  Sarcoidosis 4.  Recent biventricular defibrillator for complete heart block and ventricular arrhythmias  PLAN: He will be admitted to telemetry overnight.  Interrogation of his device did not show any arrhythmias to account for his symptoms.  Will get a  d-dimer and also check orthostatic blood pressures.  Reduce ramipril dose. check repeat echo to be sure he does not have an effusion.    Signed: Darden Palmer MD [facility] Cardiology  04/20/2014, 3:44 PM

## 2014-04-20 NOTE — ED Notes (Signed)
Patient CBG was 102 

## 2014-04-20 NOTE — ED Notes (Signed)
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

## 2014-04-20 NOTE — ED Notes (Signed)
Per EMS, Patient was at his house shaving when he started to feel light headed and eyes "started to go gray." Patient reports cold sweats during his episode and was moved to the bed before he lost consciousness. Patient was positive for Orthostatics with EMS. Patient had a Defibrillator/Pacemaker placed in January and diagnosis of Sarcoidosis of the lungs and heart. Patient had cardiac arrested on March 11, 2014. Vitals per 132/79, 60 HR AV Sequential Pacing with PVCs. Patient is currently taking prednisone and "heart medications." Patient alert and oriented x4 at this time. Skin warm, dry, and intact. Patient has no complaints of pain.

## 2014-04-20 NOTE — ED Notes (Signed)
Patient already had CBC and Creatinine drawn upon arrival to ED.

## 2014-04-21 ENCOUNTER — Observation Stay (HOSPITAL_COMMUNITY): Payer: 59

## 2014-04-21 ENCOUNTER — Encounter (HOSPITAL_COMMUNITY): Payer: Self-pay

## 2014-04-21 DIAGNOSIS — Z9849 Cataract extraction status, unspecified eye: Secondary | ICD-10-CM

## 2014-04-21 DIAGNOSIS — D8685 Sarcoid myocarditis: Secondary | ICD-10-CM

## 2014-04-21 DIAGNOSIS — R55 Syncope and collapse: Secondary | ICD-10-CM

## 2014-04-21 MED ORDER — RAMIPRIL 2.5 MG PO CAPS: 2.5000 mg | ORAL_CAPSULE | Freq: Every day | ORAL | Status: DC

## 2014-04-21 MED ORDER — IOHEXOL 350 MG/ML SOLN
100.0000 mL | Freq: Once | INTRAVENOUS | Status: AC | PRN
  Administered 2014-04-21: 100 mL via INTRAVENOUS

## 2014-04-21 NOTE — Progress Notes (Signed)
UR completed 

## 2014-04-21 NOTE — Discharge Summary (Signed)
Physician Discharge Summary     Cardiologist:  McLean/Taylor  Patient ID: CUAUHTEMOC HUEGEL MRN: [id number] DOB/AGE: Jan 28, 1966 49 y.o.  Admit date: 04/20/2014 Discharge date: 04/21/2014  Admission Diagnoses:  Presyncope  Discharge Diagnoses:  Active Problems:   Dizziness   Cardiac sarcoidosis   Chronic sys CHF   Hx VT   HX Complete Heart Block  Discharged Condition: stable  Hospital Course:   49 year old male was recently admitted with a VT arrest and shock about a month ago. He had recurrent ventricular tachycardia and heart block and was found to have cardiac sarcoidosis with normal coronary arteries. His ejection fraction was low and he was discharged on treatment with steroids as well as a biventricular ICD pacemaker. 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 About 2 weeks ago his ramipril dose was escalated to 5 mg. He woke this morning ate breakfast and was feeling fine and was standing shaving at the sink when he developed profuse sweating, diaphoresis, felt faint and felt as if things were leaving him. He had to be assisted over toward the chair and EMS was called. Blood pressure was somewhat low when they arrived it was lower when he stood up and he was transported to the emergency room. Interrogation of his defibrillator did not show any significant cardiac arrhythmias and he has felt better since he was here. He has complained of some shoulder pain since the defibrillator insertion. He denies angina and has no PND, orthopnea or edema.   He was admitted for observation.  Ramipril was decreased to 2.5mg . Interrogation of his device did not show any arrhythmias to account for his symptoms nor were there events on telemetry overnight.  His presyncope was thought to be vagal.  Echocardiogram revealed and EF of 55-60%, mild LVH, mild dilation of RV.   DDimer was elevated.  CTA chest was negative for PE,  however, two of the previously seen nodular opacities appeared slightly larger than before.  Follow up CT recommended.  The patient was seen by Dr. Shirlee Latch who felt he was stable for DC home.  Consults: NONE  Significant Diagnostic Studies:   Echo Study Conclusions  - Left ventricle: The cavity size was normal. Wall thickness was increased in a pattern of mild LVH. Systolic function was normal. The estimated ejection fraction was in the range of 55% to 60%. - Right ventricle: The cavity size was mildly dilated.   CT ANGIOGRAPHY CHEST WITH CONTRAST  TECHNIQUE: Multidetector CT imaging of the chest was performed using the standard protocol during bolus administration of intravenous contrast. Multiplanar CT image reconstructions and MIPs were obtained to evaluate the vascular anatomy.  CONTRAST: OMNIPAQUE IOHEXOL 350 MG/ML SOLN  COMPARISON: Chest CT November 29, 2011; chest radiograph April 20, 2014  FINDINGS: There is no demonstrable pulmonary embolus. There is no thoracic aortic aneurysm or dissection.  There is stable cicatrization in the anterior segment of the left upper lobe compared to the prior study. There is a nodular opacity in the posterior segment of the left lower lobe measuring 8 x 6 mm, slightly increased in size compared to the previous study. This finding is seen on slice 72 series 6. On axial slice 24 series 6, there is a 3 mm nodular opacity in the anterior segment of the right upper lobe, not seen previously. On axial slice 24 series 6, there is a stable 3 mm nodular opacity in the posterior segment of the  right upper lobe. There is a small area of scarring in the periphery of the anterior segment of the right upper lobe seen on slice 26 series 6, stable. There is a stable 3 mm nodular opacity in the anterior segment right upper lobe seen on slice 30 series 6. There is a stable 4 mm nodular opacity on slice 33 series 6 in the superior  segment of the right upper lobe. There is a 6 mm nodular opacity in the posterior segment of the right upper lobe seen on axial slice 40 series 6, slightly larger than on the previous study. Scattered lower lobe 1-2 mm nodular opacities are felt to be unchanged. There is a stable 5 mm nodular opacity in the inferior lingula seen on axial slice 56 series 6.  Multiple small mediastinal lymph nodes remain stable. The largest lymph node is in the sub- carinal region measuring 1.2 x 0.8 cm, smaller than on the previous study. No new adenopathy.  The pericardium is not appreciably thickened. Note that there is a pacemaker now present with leads attached to the right heart. Pericardium is not thickened.  Visualized upper abdominal structures appear unremarkable. There are no appreciable blastic or lytic bone lesions. Thyroid appears unremarkable.  Review of the MIP images confirms the above findings.  IMPRESSION: No demonstrable pulmonary embolus.  Stable area of cicatrization left upper lobe. Multiple small nodular opacities, for the most part unchanged. Two of these nodular opacities do appear slightly larger compared to the previous study. Given this change, a followup CT in 3 months to assess for stability advised.  Multiple small lymph nodes without frank adenopathy by size criteria. The largest lymph node noted previously was in the sub- carinal region. This lymph node is slightly smaller at this time compared to prior study.  Treatments: See Above  Discharge Exam: Blood pressure 134/86, pulse 59, temperature 98 F (36.7 C), temperature source Oral, resp. rate 16, height 5\' 11"  (1.803 m), weight 190 lb 6.4 oz (86.365 kg), SpO2 98 %.   Disposition: 01-Home or Self Care      Discharge Instructions    Diet - low sodium heart healthy    Complete by:  As directed      Increase activity slowly    Complete by:  As directed             Medication List    TAKE  these medications        amiodarone 400 MG tablet  Commonly known as:  PACERONE  Take 1 tablet (400 mg total) by mouth daily.     carvedilol 6.25 MG tablet  Commonly known as:  COREG  Take 1 tablet (6.25 mg total) by mouth 2 (two) times daily with a meal.     pantoprazole 40 MG tablet  Commonly known as:  PROTONIX  Take 40 mg by mouth 2 (two) times daily.     predniSONE 20 MG tablet  Commonly known as:  DELTASONE  Take 3 tablets (60 mg total) by mouth daily with breakfast.     ramipril 2.5 MG capsule  Commonly known as:  ALTACE  Take 1 capsule (2.5 mg total) by mouth daily.     zolpidem 10 MG tablet  Commonly known as:  AMBIEN  Take 1 tablet (10 mg total) by mouth at bedtime as needed for sleep.       Follow-up Information    Follow up with Marca Ancona, MD.   Specialty:  Cardiology   Why:  Follow up as scheduled previously.    Contact information:   [address] [place] [address] [phone]      Greater than 30 minutes was spent completing the patient's discharge.    SignedWilburt Finlay, PAC 04/21/2014, 1:52 PM

## 2014-04-21 NOTE — Progress Notes (Signed)
Patient ID: Christopher Patrick, male   DOB: 12/28/1965, 49 y.o.   MRN: [id number]   SUBJECTIVE: No further complaints, feels back to normal.  Telemetry over night showed A-BiV sequential pacing, no events.   Of note, D dimer was elevated.   Scheduled Meds: . amiodarone  400 mg Oral Daily  . carvedilol  6.25 mg Oral BID WC  . enoxaparin (LOVENOX) injection  40 mg Subcutaneous Q24H  . pantoprazole  40 mg Oral BID  . predniSONE  60 mg Oral Q breakfast  . ramipril  2.5 mg Oral Daily   Continuous Infusions:  PRN Meds:.acetaminophen, nitroGLYCERIN, ondansetron (ZOFRAN) IV    Filed Vitals:   04/20/14 1645 04/20/14 1723 04/20/14 2000 04/21/14 0400  BP: 121/78 152/78 108/52 134/86  Pulse: 60 60 60 59  Temp:  98.7 F (37.1 C) 97.9 F (36.6 C) 98 F (36.7 C)  TempSrc:  Oral Oral Oral  Resp: 14 16    Height:  5\' 11"  (1.803 m)  5\' 11"  (1.803 m)  Weight:  190 lb (86.183 kg)  190 lb 6.4 oz (86.365 kg)  SpO2: 99% 99% 98% 98%    Intake/Output Summary (Last 24 hours) at 04/21/14 4098 Last data filed at 04/21/14 0500  Gross per 24 hour  Intake    240 ml  Output      0 ml  Net    240 ml    LABS: Basic Metabolic Panel:  Recent Labs  11/91/47 1200  NA 137  K 3.8  CL 105  CO2 22  GLUCOSE 106*  BUN 16  CREATININE 1.19  CALCIUM 9.2   Liver Function Tests:  Recent Labs  04/20/14 1200  AST 17  ALT 19  ALKPHOS 58  BILITOT 1.2  PROT 5.5*  ALBUMIN 3.4*   No results for input(s): LIPASE, AMYLASE in the last 72 hours. CBC:  Recent Labs  04/20/14 1200  WBC 12.9*  HGB 14.7  HCT 42.5  MCV 88.9  PLT 98*   Cardiac Enzymes: No results for input(s): CKTOTAL, CKMB, CKMBINDEX, TROPONINI in the last 72 hours. BNP: Invalid input(s): POCBNP D-Dimer:  Recent Labs  04/20/14 1623  DDIMER 0.91*   Hemoglobin A1C: No results for input(s): HGBA1C in the last 72 hours. Fasting Lipid Panel: No results for input(s): CHOL, HDL, LDLCALC, TRIG, CHOLHDL, LDLDIRECT in the last 72  hours. Thyroid Function Tests: No results for input(s): TSH, T4TOTAL, T3FREE, THYROIDAB in the last 72 hours.  Invalid input(s): FREET3 Anemia Panel: No results for input(s): VITAMINB12, FOLATE, FERRITIN, TIBC, IRON, RETICCTPCT in the last 72 hours.  RADIOLOGY: Dg Chest Port 1 View  04/20/2014   CLINICAL DATA:  Near syncope.  Pacer placed 1 month prior.  EXAM: PORTABLE CHEST - 1 VIEW  COMPARISON:  03/18/2014  FINDINGS: Left-sided AICD device is present, leads are stable in position. Stable enlarged cardiac and mediastinal contours. No consolidative or nodular pulmonary opacities. No pleural effusion or pneumothorax.  IMPRESSION: No acute cardiopulmonary process.   Electronically Signed   By: Annia Belt M.D.   On: 04/20/2014 13:21    PHYSICAL EXAM General: NAD Neck: No JVD, no thyromegaly or thyroid nodule.  Lungs: Clear to auscultation bilaterally with normal respiratory effort. CV: Nondisplaced PMI.  Heart regular S1/S2, no S3/S4, no murmur.  No peripheral edema.  No carotid bruit.  Normal pedal pulses.  Abdomen: Soft, nontender, no hepatosplenomegaly, no distention.  Neurologic: Alert and oriented x 3.  Psych: Normal affect. Extremities: No clubbing or cyanosis.  TELEMETRY: Reviewed telemetry pt in A-BiV sequential pacing  ASSESSMENT AND PLAN: 49 yo with pulmonary and cardiac sarcoidosis presented with presyncopal episode.  1. Presyncope: Patient was standing at mirror shaving and noted severe lightheadedness, profound diaphoresis.  He had to lie down but did not pass out.  When EMS arrived, BP was low.  CRT-D interrogation showed no arrhythmic events and overnight telemetry was ok.  I suspect that this was a vagal event.  - Agree with decreasing ramipril dose back to 2.5 mg daily for now.   - D dimer sent last night and was elevated.  Will arrange for CTA chest to rule out PE.  This can also be used to followup on pulmonary sarcoidosis (planned for CT chest later in the month by  pulmonary).  - Echo ordered to rule out pericardial effusion.  - If echo and CT ok, may go home today.  2. Cardiac sarcoidosis: I suspect this is the etiology of his cardiomyopathy, VT, and complete heart block. He is on prednisone 60 mg daily. He will need a very slow taper of this over a year or so. This will be followed by Dr. Craige Cotta. We will not be able to do another MRI on him but will follow LV function by echo.  3. Chronic systolic CHF: Initial EF 15-20% in 1/16, suspect this was due to stunning from his prior cardiac arrest. Cardiac MRI later in 1/16 showed LV EF 46% with moderate RV systolic dysfunction. He looks euvolemic on exam today. NYHA class I-II symptoms.  - Continue current Coreg - Given preysyncope, we decreased ramipril back to 2.5 mg daily.  4. VT: Has ICD, on amiodarone. No recurrence on device interrogation.  5. Complete heart block: St Jude CRT.   Marca Ancona 04/21/2014 9:29 AM

## 2014-04-21 NOTE — Progress Notes (Signed)
  Echocardiogram 2D Echocardiogram has been performed.  Christopher Patrick 04/21/2014, 10:00 AM

## 2014-04-23 ENCOUNTER — Other Ambulatory Visit (HOSPITAL_COMMUNITY): Payer: 59

## 2014-04-25 ENCOUNTER — Telehealth: Payer: Self-pay | Admitting: Pulmonary Disease

## 2014-04-25 NOTE — Telephone Encounter (Signed)
Called spoke with Stacy. Pt had CTA done 04/21/14. Pt is scheduled for CT chest (ordered by TP) on 04/29/14 and scheduled to see VS 05/01/14. Is CT chest still needed? Please advise TP thanks

## 2014-04-25 NOTE — Telephone Encounter (Signed)
No he does not need CT chest  Keep .follow up with Dr. Craige Cotta

## 2014-04-28 NOTE — Telephone Encounter (Signed)
Called and spoke to Blaine at [facility]. Informed Stacy the pt's CT is no longer needed. Stacy verbalized understanding. Called and spoke to pt. Informed pt that he does not need the upcoming CT but to keep f/u and PFT with VS. Pt verbalized understanding and denied any further questions or concerns at this time.

## 2014-04-29 ENCOUNTER — Inpatient Hospital Stay: Admission: RE | Admit: 2014-04-29 | Payer: 59 | Source: Ambulatory Visit

## 2014-04-30 ENCOUNTER — Other Ambulatory Visit: Payer: Self-pay | Admitting: Pulmonary Disease

## 2014-04-30 DIAGNOSIS — R0600 Dyspnea, unspecified: Secondary | ICD-10-CM

## 2014-05-01 ENCOUNTER — Ambulatory Visit (INDEPENDENT_AMBULATORY_CARE_PROVIDER_SITE_OTHER): Payer: 59 | Admitting: Pulmonary Disease

## 2014-05-01 ENCOUNTER — Encounter: Payer: Self-pay | Admitting: Pulmonary Disease

## 2014-05-01 VITALS — BP 128/74 | HR 62 | Temp 97.7°F | Ht 71.0 in | Wt 201.6 lb

## 2014-05-01 DIAGNOSIS — D860 Sarcoidosis of lung: Secondary | ICD-10-CM

## 2014-05-01 DIAGNOSIS — R05 Cough: Secondary | ICD-10-CM

## 2014-05-01 DIAGNOSIS — R0600 Dyspnea, unspecified: Secondary | ICD-10-CM

## 2014-05-01 DIAGNOSIS — R058 Other specified cough: Secondary | ICD-10-CM

## 2014-05-01 LAB — PULMONARY FUNCTION TEST
DL/VA % PRED: 85 %
DL/VA: 4.02 ml/min/mmHg/L
DLCO UNC % PRED: 75 %
DLCO UNC: 25.45 ml/min/mmHg
FEF 25-75 PRE: 2.47 L/s
FEF 25-75 Post: 2.68 L/sec
FEF2575-%Change-Post: 8 %
FEF2575-%PRED-PRE: 70 %
FEF2575-%Pred-Post: 76 %
FEV1-%CHANGE-POST: 2 %
FEV1-%Pred-Post: 90 %
FEV1-%Pred-Pre: 88 %
FEV1-PRE: 3.56 L
FEV1-Post: 3.63 L
FEV1FVC-%Change-Post: 3 %
FEV1FVC-%Pred-Pre: 92 %
FEV6-%CHANGE-POST: -1 %
FEV6-%PRED-POST: 95 %
FEV6-%Pred-Pre: 97 %
FEV6-POST: 4.82 L
FEV6-PRE: 4.88 L
FEV6FVC-%CHANGE-POST: 0 %
FEV6FVC-%PRED-POST: 102 %
FEV6FVC-%PRED-PRE: 101 %
FVC-%CHANGE-POST: -1 %
FVC-%PRED-POST: 93 %
FVC-%PRED-PRE: 95 %
FVC-POST: 4.87 L
FVC-Pre: 4.94 L
POST FEV6/FVC RATIO: 99 %
Post FEV1/FVC ratio: 75 %
Pre FEV1/FVC ratio: 72 %
Pre FEV6/FVC Ratio: 99 %
RV % pred: 66 %
RV: 1.41 L
TLC % PRED: 87 %
TLC: 6.27 L

## 2014-05-01 NOTE — Patient Instructions (Signed)
Avoid clearing your throat Sip on water when you have urge to cough Do salt water gargles twice per day Try albuterol two puffs up to four times per day for cough or chest congestion Follow up in 6 weeks with Dr. Craige Cotta or Tammy Parrett

## 2014-05-01 NOTE — Progress Notes (Deleted)
   Subjective:    Patient ID: Christopher Patrick, male    DOB: Feb 03, 1966, 49 y.o.   MRN: [id number]  HPI    Review of Systems  Constitutional: Negative for fever and unexpected weight change.  HENT: Negative for congestion, dental problem, ear pain, nosebleeds, postnasal drip, rhinorrhea, sinus pressure, sneezing, sore throat and trouble swallowing.   Eyes: Negative for redness and itching.  Respiratory: Positive for cough. Negative for chest tightness, shortness of breath and wheezing.   Cardiovascular: Negative for palpitations and leg swelling.  Gastrointestinal: Negative for nausea and vomiting.  Genitourinary: Negative for dysuria.  Musculoskeletal: Negative for joint swelling.  Skin: Negative for rash.  Neurological: Negative for headaches.  Hematological: Does not bruise/bleed easily.  Psychiatric/Behavioral: Negative for dysphoric mood. The patient is not nervous/anxious.        Objective:   Physical Exam        Assessment & Plan:

## 2014-05-01 NOTE — Progress Notes (Signed)
Chief Complaint  Patient presents with  . PULMONARY CONSULT    Referred by Carlean Jews, [facility]. PFT today.     History of Present Illness: Christopher Patrick is a 49 y.o. male with pulmonary/cardiac sarcoidosis.  He was in hospital again with syncope.  He had Echo and CT chest.  He has globus sensation.  He will sometimes bring up clear to yellow/green sputum.  Usually he will just clear his throat.  He denies post nasal drip, but does get allergies.  He was started on protonix BID.  He remains on 60 mg prednisone.  He has albuterol inhaler, but has not been using it.  He does not feel short of breath.  He still has soreness in his chest from prior CPR.   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   Physical Exam: Blood pressure 128/74, pulse 62, temperature 97.7 F (36.5 C), temperature source Oral, height 5\' 11"  (1.803 m), weight 201 lb 9.6 oz (91.445 kg), SpO2 96 %. Body mass index is 28.13 kg/(m^2).  General - No distress ENT - No sinus tenderness, no oral exudate, no LAN Cardiac - s1s2 regular, no murmur Chest - No wheeze/rales/dullness Back - No focal tenderness Abd - Soft, non-tender Ext - No edema Neuro - Normal strength Skin - No rashes Psych - normal mood, and behavior   Assessment/Plan:  Pulmonary sarcoidosis. Discussion: He has minimal respiratory symptoms, and relatively normal PFT's.  He has mild findings on recent CT chest. Plan: - based on pulmonary findings he could start to wean prednisone  Cardiac  sarcoidosis. Plan: - he has f/u with cardiology schedule >> defer to cardiology about when he can have his prednisone reduced  Upper airway cough syndrome. Plan: - advised him to avoid throat clearing - he can use use salt water gargles bid - sip water when urge to cough - will change protonix to 40 mg daily >> advised him to take 30 minutes before first meal of the day - if his symptoms persists, then he might need to have ramipril changed to ARB >> defer to cardiology - he can try using albuterol as needed   Coralyn Helling, MD [facility] Pager:  [phone]

## 2014-05-01 NOTE — Progress Notes (Signed)
PFT done today. 

## 2014-05-05 ENCOUNTER — Encounter (HOSPITAL_BASED_OUTPATIENT_CLINIC_OR_DEPARTMENT_OTHER): Payer: Self-pay | Admitting: *Deleted

## 2014-05-05 ENCOUNTER — Emergency Department (HOSPITAL_BASED_OUTPATIENT_CLINIC_OR_DEPARTMENT_OTHER)
Admission: EM | Admit: 2014-05-05 | Discharge: 2014-05-05 | Disposition: A | Discharge status: Home / Self Care | Payer: 59 | Attending: Emergency Medicine | Admitting: Emergency Medicine

## 2014-05-05 ENCOUNTER — Emergency Department (HOSPITAL_BASED_OUTPATIENT_CLINIC_OR_DEPARTMENT_OTHER): Payer: 59

## 2014-05-05 ENCOUNTER — Encounter (HOSPITAL_COMMUNITY): Payer: Self-pay

## 2014-05-05 ENCOUNTER — Ambulatory Visit (HOSPITAL_COMMUNITY)
Admission: RE | Admit: 2014-05-05 | Discharge: 2014-05-05 | Disposition: A | Discharge status: Home / Self Care | Payer: 59 | Source: Ambulatory Visit | Attending: Internal Medicine | Admitting: Internal Medicine

## 2014-05-05 VITALS — BP 122/68 | HR 60 | Wt 202.5 lb

## 2014-05-05 DIAGNOSIS — Z9889 Other specified postprocedural states: Secondary | ICD-10-CM | POA: Insufficient documentation

## 2014-05-05 DIAGNOSIS — Z95810 Presence of automatic (implantable) cardiac defibrillator: Secondary | ICD-10-CM | POA: Diagnosis not present

## 2014-05-05 DIAGNOSIS — D8685 Sarcoid myocarditis: Secondary | ICD-10-CM

## 2014-05-05 DIAGNOSIS — D8689 Sarcoidosis of other sites: Secondary | ICD-10-CM | POA: Insufficient documentation

## 2014-05-05 DIAGNOSIS — K219 Gastro-esophageal reflux disease without esophagitis: Secondary | ICD-10-CM | POA: Diagnosis not present

## 2014-05-05 DIAGNOSIS — D860 Sarcoidosis of lung: Secondary | ICD-10-CM | POA: Insufficient documentation

## 2014-05-05 DIAGNOSIS — I428 Other cardiomyopathies: Secondary | ICD-10-CM

## 2014-05-05 DIAGNOSIS — I472 Ventricular tachycardia: Secondary | ICD-10-CM | POA: Diagnosis not present

## 2014-05-05 DIAGNOSIS — Z79899 Other long term (current) drug therapy: Secondary | ICD-10-CM | POA: Insufficient documentation

## 2014-05-05 DIAGNOSIS — N1330 Unspecified hydronephrosis: Secondary | ICD-10-CM | POA: Diagnosis not present

## 2014-05-05 DIAGNOSIS — Z7952 Long term (current) use of systemic steroids: Secondary | ICD-10-CM | POA: Insufficient documentation

## 2014-05-05 DIAGNOSIS — Z87448 Personal history of other diseases of urinary system: Secondary | ICD-10-CM | POA: Insufficient documentation

## 2014-05-05 DIAGNOSIS — I442 Atrioventricular block, complete: Secondary | ICD-10-CM | POA: Insufficient documentation

## 2014-05-05 DIAGNOSIS — I5022 Chronic systolic (congestive) heart failure: Secondary | ICD-10-CM | POA: Diagnosis not present

## 2014-05-05 DIAGNOSIS — Z8679 Personal history of other diseases of the circulatory system: Secondary | ICD-10-CM | POA: Diagnosis not present

## 2014-05-05 DIAGNOSIS — Z862 Personal history of diseases of the blood and blood-forming organs and certain disorders involving the immune mechanism: Secondary | ICD-10-CM | POA: Diagnosis not present

## 2014-05-05 DIAGNOSIS — R55 Syncope and collapse: Secondary | ICD-10-CM | POA: Insufficient documentation

## 2014-05-05 DIAGNOSIS — I429 Cardiomyopathy, unspecified: Secondary | ICD-10-CM

## 2014-05-05 DIAGNOSIS — N132 Hydronephrosis with renal and ureteral calculous obstruction: Secondary | ICD-10-CM

## 2014-05-05 DIAGNOSIS — R109 Unspecified abdominal pain: Secondary | ICD-10-CM | POA: Diagnosis present

## 2014-05-05 DIAGNOSIS — N201 Calculus of ureter: Secondary | ICD-10-CM | POA: Diagnosis not present

## 2014-05-05 LAB — BASIC METABOLIC PANEL
ANION GAP: 7 (ref 5–15)
Anion gap: 1 — ABNORMAL LOW (ref 5–15)
BUN: 17 mg/dL (ref 6–23)
BUN: 22 mg/dL (ref 6–23)
CALCIUM: 9.3 mg/dL (ref 8.4–10.5)
CHLORIDE: 101 mmol/L (ref 96–112)
CO2: 28 mmol/L (ref 19–32)
CO2: 30 mmol/L (ref 19–32)
CREATININE: 1.15 mg/dL (ref 0.50–1.35)
Calcium: 8.6 mg/dL (ref 8.4–10.5)
Chloride: 102 mmol/L (ref 96–112)
Creatinine, Ser: 1.27 mg/dL (ref 0.50–1.35)
GFR calc non Af Amer: 64 mL/min — ABNORMAL LOW (ref >90)
GFR, EST AFRICAN AMERICAN: 83 mL/min — AB (ref >90)
GFR, EST AFRICAN AMERICAN: 74 mL/min — AB (ref >90)
GFR, EST NON AFRICAN AMERICAN: 72 mL/min — AB (ref >90)
Glucose, Bld: 129 mg/dL — ABNORMAL HIGH (ref 70–99)
Glucose, Bld: 141 mg/dL — ABNORMAL HIGH (ref 70–99)
Potassium: 4.4 mmol/L (ref 3.5–5.1)
Potassium: 4.3 mmol/L (ref 3.5–5.1)
SODIUM: 132 mmol/L — AB (ref 135–145)
Sodium: 137 mmol/L (ref 135–145)

## 2014-05-05 LAB — LIPID PANEL
CHOL/HDL RATIO: 2.7 ratio
Cholesterol: 229 mg/dL — ABNORMAL HIGH (ref 0–200)
HDL: 84 mg/dL (ref >39)
LDL Cholesterol: 129 mg/dL — ABNORMAL HIGH (ref 0–99)
Triglycerides: 80 mg/dL (ref <150)
VLDL: 16 mg/dL (ref 0–40)

## 2014-05-05 LAB — URINALYSIS, ROUTINE W REFLEX MICROSCOPIC
Bilirubin Urine: NEGATIVE
GLUCOSE, UA: NEGATIVE mg/dL
Ketones, ur: NEGATIVE mg/dL
Leukocytes, UA: NEGATIVE
Nitrite: NEGATIVE
Protein, ur: NEGATIVE mg/dL
Specific Gravity, Urine: 1.021 (ref 1.005–1.030)
Urobilinogen, UA: 0.2 mg/dL (ref 0.0–1.0)
pH: 5 (ref 5.0–8.0)

## 2014-05-05 LAB — CBC WITH DIFFERENTIAL/PLATELET
BASOS PCT: 0 % (ref 0–1)
Basophils Absolute: 0 10*3/uL (ref 0.0–0.1)
EOS ABS: 0 10*3/uL (ref 0.0–0.7)
EOS PCT: 0 % (ref 0–5)
HCT: 40.1 % (ref 39.0–52.0)
HEMOGLOBIN: 13.5 g/dL (ref 13.0–17.0)
LYMPHS PCT: 2 % — AB (ref 12–46)
Lymphs Abs: 0.3 10*3/uL — ABNORMAL LOW (ref 0.7–4.0)
MCH: 31.3 pg (ref 26.0–34.0)
MCHC: 33.7 g/dL (ref 30.0–36.0)
MCV: 92.8 fL (ref 78.0–100.0)
Monocytes Absolute: 0.8 10*3/uL (ref 0.1–1.0)
Monocytes Relative: 5 % (ref 3–12)
Neutro Abs: 13.9 10*3/uL — ABNORMAL HIGH (ref 1.7–7.7)
Neutrophils Relative %: 93 % — ABNORMAL HIGH (ref 43–77)
Platelets: 142 10*3/uL — ABNORMAL LOW (ref 150–400)
RBC: 4.32 MIL/uL (ref 4.22–5.81)
RDW: 15 % (ref 11.5–15.5)
WBC: 15 10*3/uL — ABNORMAL HIGH (ref 4.0–10.5)

## 2014-05-05 LAB — URINE MICROSCOPIC-ADD ON

## 2014-05-05 LAB — TSH: TSH: 3.57 u[IU]/mL (ref 0.350–4.500)

## 2014-05-05 MED ORDER — HYDROMORPHONE HCL 1 MG/ML IJ SOLN
1.0000 mg | Freq: Once | INTRAMUSCULAR | Status: AC
  Administered 2014-05-05: 1 mg via INTRAVENOUS
  Filled 2014-05-05: qty 1

## 2014-05-05 MED ORDER — KETOROLAC TROMETHAMINE 30 MG/ML IJ SOLN
30.0000 mg | Freq: Once | INTRAMUSCULAR | Status: AC
  Administered 2014-05-05: 30 mg via INTRAVENOUS
  Filled 2014-05-05: qty 1

## 2014-05-05 MED ORDER — PREDNISONE 20 MG PO TABS: ORAL_TABLET | ORAL | Status: DC

## 2014-05-05 MED ORDER — TAMSULOSIN HCL 0.4 MG PO CAPS: 0.4000 mg | ORAL_CAPSULE | Freq: Every day | ORAL | Status: DC

## 2014-05-05 MED ORDER — ONDANSETRON HCL 4 MG/2ML IJ SOLN
4.0000 mg | Freq: Once | INTRAMUSCULAR | Status: AC
  Administered 2014-05-05: 4 mg via INTRAVENOUS
  Filled 2014-05-05: qty 2

## 2014-05-05 MED ORDER — AMIODARONE HCL 400 MG PO TABS: 200.0000 mg | ORAL_TABLET | Freq: Every day | ORAL | Status: DC

## 2014-05-05 MED ORDER — OXYCODONE-ACETAMINOPHEN 5-325 MG PO TABS: 1.0000 | ORAL_TABLET | ORAL | Status: DC | PRN

## 2014-05-05 NOTE — ED Notes (Signed)
Patient transported to CT via stretcher per tech. 

## 2014-05-05 NOTE — ED Notes (Signed)
Pt c/oleft flank pain and radiates to left lower abd pain x 12 hrs

## 2014-05-05 NOTE — ED Notes (Signed)
np at bedside

## 2014-05-05 NOTE — Patient Instructions (Signed)
DECREASE Amiodarone to 200 mg daily DECREASE Prednisone to 40 mg daily for one week, then 30 mg daily for one week, then 20 mg daily for one week, then 10 mg daily thereafter  Labs today  Your physician recommends that you schedule a follow-up appointment in: 2 months  Do the following things EVERYDAY: 1) Weigh yourself in the morning before breakfast. Write it down and keep it in a log. 2) Take your medicines as prescribed 3) Eat low salt foods-Limit salt (sodium) to 2000 mg per day.  4) Stay as active as you can everyday 5) Limit all fluids for the day to less than 2 liters 6)

## 2014-05-05 NOTE — Progress Notes (Addendum)
Patient ID: Christopher Patrick, male   DOB: October 28, 1965, 49 y.o.   MRN: [id number] PCP: Dr. Leonette Most  49 yo with history of pulmonary sarcoidosis was admitted to [facility] in 1/16 after a VT arrest with 20 minutes CPR and defibrillation x 3.  He had therapeutic hypothermia.  He had good neurologic recovery.  He developed VT storm and ended up getting St Jude BiV ICD and starting amiodarone.  LHC showed no significant coronary disease.  Echo initially after arrest showed EF 15-20%, but cardiac MRI a week or so later showed EF 46% with LGE pattern concerning for cardiac sarcoidosis.  Patient also had complete heart block (treated with St Jude BiV ICD).  The cause of VT and CHB was thought to be cardiac sarcoidosis.  He was started on prednisone 60 mg daily and is following with pulmonary (Dr Craige Cotta).    He was readmitted in 2/16 with presyncope. After extensive evaluation, this was thought to be vagal in origin most likely.  No evidence for arrhythmia by device interrogation.  Echo showed EF improved back to 55-60% with mild RV dilation.  CTA chest showed no PE. Ramipril was decreased to 2.5 mg daily.   Patient has had no further syncope and no lightheadedness.  He is doing well, no exertional dypsnea and no chest pain.  He saw Dr. Craige Cotta recently and has been ok'd from a pulmonary standpoint to taper steroids. PFTs in 2/16 were minimally abnormal.   Labs (1/16): K 4.1, creatinine 1.18, LFTs normal, HCT 42.5, plts 91K Labs (2/16): LFTs normal, K 3.8, creatinine 1.19  PMH: 1. Pulmonary sarcoidosis: Diagnosed around 2011.  PFTs (2/16) with minimal abnormality.  2. 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   - Echo (1/16) with EF 15-20%, diffuse hypokinesis, RV moderately dilated with severe decreased systolic function (initially post-cardiac arrest).  - Cardiac MRI (1/16) with EF 46%, basal to mid anteroseptal and apical inferior hypokinesis, RV moderately dilated with mild to moderately  decreased systolic function, RV EF 33%, patchy LGE in a pattern concerning for cardiac sarcoidosis.  - Echo (2/16) with EF 55-60%, mild RV dilation.  3. Ventricular tachycardia: Admission 1/16 with cardiac arrest and VT storm. Suspect due to cardiac sarcoidosis.  - LHC (1/16) with normal coronaries.  - St Jude BiV ICD placed, amiodarone started.  4. Complete heart block: Likely due to cardiac sarcoidosis.  St Jude BiV ICD placed.  5. GERD 6. Pre-syncope: 2/16, possibly vagal event.   SH: Emergency planning/management officer, lives in [place], nonsmoker, married  FH: Grandfather with CHF  ROS: All systems reviewed and negative except as per HPI.   Current Outpatient Prescriptions  Medication Sig Dispense Refill  . amiodarone (PACERONE) 400 MG tablet Take 0.5 tablets (200 mg total) by mouth daily. 30 tablet 5  . carvedilol (COREG) 6.25 MG tablet Take 1 tablet (6.25 mg total) by mouth 2 (two) times daily with a meal. 60 tablet 11  . pantoprazole (PROTONIX) 40 MG tablet Take 40 mg by mouth daily before breakfast.    . predniSONE (DELTASONE) 20 MG tablet Decrease to 40 mg x 1 week, then 30 mg x 1 week, then 20 mg x 1 week, then 10 mg daily thereafter 90 tablet 2  . ramipril (ALTACE) 2.5 MG capsule Take 1 capsule (2.5 mg total) by mouth daily. 30 capsule 5  . zolpidem (AMBIEN) 10 MG tablet Take 1 tablet (10 mg total) by mouth at bedtime as needed for sleep. 30 tablet 0  .  oxyCODONE-acetaminophen (PERCOCET/ROXICET) 5-325 MG per tablet Take 1-2 tablets by mouth every 4 (four) hours as needed for severe pain. 20 tablet 0  . tamsulosin (FLOMAX) 0.4 MG CAPS capsule Take 1 capsule (0.4 mg total) by mouth daily. 30 capsule 0   No current facility-administered medications for this encounter.   BP 122/68 mmHg  Pulse 60  Wt 202 lb 8 oz (91.853 kg)  SpO2 96% General: NAD Neck: No JVD, no thyromegaly or thyroid nodule.  Lungs: Clear to auscultation bilaterally with normal respiratory effort. CV: Nondisplaced PMI.  Heart  regular S1/S2, no S3/S4, no murmur.  No peripheral edema.  No carotid bruit.  Normal pedal pulses.  Abdomen: Soft, nontender, no hepatosplenomegaly, no distention.  Skin: Intact without lesions or rashes.  Neurologic: Alert and oriented x 3.  Psych: Normal affect. Extremities: No clubbing or cyanosis.  HEENT: Normal.   Assessment/Plan: 1. Cardiac sarcoidosis: I suspect this is the etiology of cardiomyopathy, VT, and complete heart block.  He is on prednisone 60 mg daily. He will need a slow taper of this over a year or so.  Most recent echo in 2/16 showed EF improved to normal range.  Pulmonary sarcoidosis is quiescent. - I will have him decrease prednisone to 40 mg daily today.  After 1 week, he will decrease to 30 mg daily then after 1 week 20 mg daily then after 1 week 10 mg daily.  He will continue 10 mg daily likely for a year or so.   2. Chronic systolic CHF: Initial EF 15-20% in hospital, suspect this was due to stunning from cardiac arrest.  Cardiac MRI a week or so later showed LV EF 46% with moderate RV systolic dysfunction. EF back to 55-60% on most recent echo in 2/16. He looks euvolemic on exam today.  NYHA class I-II symptoms.  - Continue current Coreg and ramipril. 3. VT: Has ICD, on amiodarone.  Recent LFTs normal.  Will need to check TSH.  Will need at least yearly eye exam while on amiodarone. Decreased amiodarone to 200 mg daily.  Finally, no driving for 6 months (until 7/16) given cardiac arrest with syncope.   4. Complete heart block: St Jude CRT, to be followed by Dr Ladona Ridgel.   Marca Ancona 05/05/2014

## 2014-05-05 NOTE — Discharge Instructions (Signed)
As discussed you need to follow up for continued or worsening symptom. If you develop a fever you need to be seen Kidney Stones Kidney stones (urolithiasis) are deposits that form inside your kidneys. The intense pain is caused by the stone moving through the urinary tract. When the stone moves, the ureter goes into spasm around the stone. The stone is usually passed in the urine.  CAUSES   A disorder that makes certain neck glands produce too much parathyroid hormone (primary hyperparathyroidism).  A buildup of uric acid crystals, similar to gout in your joints.  Narrowing (stricture) of the ureter.  A kidney obstruction present at birth (congenital obstruction).  Previous surgery on the kidney or ureters.  Numerous kidney infections. SYMPTOMS   Feeling sick to your stomach (nauseous).  Throwing up (vomiting).  Blood in the urine (hematuria).  Pain that usually spreads (radiates) to the groin.  Frequency or urgency of urination. DIAGNOSIS   Taking a history and physical exam.  Blood or urine tests.  CT scan.  Occasionally, an examination of the inside of the urinary bladder (cystoscopy) is performed. TREATMENT   Observation.  Increasing your fluid intake.  Extracorporeal shock wave lithotripsy--This is a noninvasive procedure that uses shock waves to break up kidney stones.  Surgery may be needed if you have severe pain or persistent obstruction. There are various surgical procedures. Most of the procedures are performed with the use of small instruments. Only small incisions are needed to accommodate these instruments, so recovery time is minimized. The size, location, and chemical composition are all important variables that will determine the proper choice of action for you. Talk to your health care provider to better understand your situation so that you will minimize the risk of injury to yourself and your kidney.  HOME CARE INSTRUCTIONS   Drink enough water and  fluids to keep your urine clear or pale yellow. This will help you to pass the stone or stone fragments.  Strain all urine through the provided strainer. Keep all particulate matter and stones for your health care provider to see. The stone causing the pain may be as small as a grain of salt. It is very important to use the strainer each and every time you pass your urine. The collection of your stone will allow your health care provider to analyze it and verify that a stone has actually passed. The stone analysis will often identify what you can do to reduce the incidence of recurrences.  Only take over-the-counter or prescription medicines for pain, discomfort, or fever as directed by your health care provider.  Make a follow-up appointment with your health care provider as directed.  Get follow-up X-rays if required. The absence of pain does not always mean that the stone has passed. It may have only stopped moving. If the urine remains completely obstructed, it can cause loss of kidney function or even complete destruction of the kidney. It is your responsibility to make sure X-rays and follow-ups are completed. Ultrasounds of the kidney can show blockages and the status of the kidney. Ultrasounds are not associated with any radiation and can be performed easily in a matter of minutes. SEEK MEDICAL CARE IF:  You experience pain that is progressive and unresponsive to any pain medicine you have been prescribed. SEEK IMMEDIATE MEDICAL CARE IF:   Pain cannot be controlled with the prescribed medicine.  You have a fever or shaking chills.  The severity or intensity of pain increases over 18 hours and  is not relieved by pain medicine.  You develop a new onset of abdominal pain.  You feel faint or pass out.  You are unable to urinate. MAKE SURE YOU:   Understand these instructions.  Will watch your condition.  Will get help right away if you are not doing well or get worse. Document  Released: 02/21/2005 Document Revised: 10/24/2012 Document Reviewed: 07/25/2012 [facility] Patient Information 2015 [place], [place]. This information is not intended to replace advice given to you by your health care provider. Make sure you discuss any questions you have with your health care provider.

## 2014-05-05 NOTE — ED Provider Notes (Signed)
CSN: [id number]     Arrival date & time 05/05/14  1515 History   First MD Initiated Contact with Patient 05/05/14 1527     Chief Complaint  Patient presents with  . Flank Pain     (Consider location/radiation/quality/duration/timing/severity/associated sxs/prior Treatment) HPI Comments: Left flank pain radiating to abdomen. Minimal urination in the last 8 hour:last kidney stone was 6 years ago  Patient is a 49 y.o. male presenting with flank pain. The history is provided by the patient. No language interpreter was used.  Flank Pain This is a new problem. The current episode started today. The problem occurs constantly. The problem has been unchanged. Pertinent negatives include no congestion, fever or vomiting. Nothing aggravates the symptoms. He has tried nothing for the symptoms.    Past Medical History  Diagnosis Date  . Pulmonary sarcoidosis   . GERD (gastroesophageal reflux disease)   . Cardiac arrest     a. VT arrest with recurrent VT 03/2014, s/p CPR, multiple defib. Ultimately felt 2/2 cardiac sarcoid. Placed on amiodarone. Hospitalization complicated by circulatory shock, VDRF, encephalopathy, AKI, and hiccups which improved by discharge.  . Recurrent ventricular tachycardia   . Complete heart block     a. s/p St Jude BiV ICD/pacemaker placement w/ BS leads 03/17/14  . Cardiac sarcoidosis   . Non-ischemic cardiomyopathy     a. EF15-20% by echo 03/12/14 - s/p BiV-ICD placement. Normal cors 03/2014.  Marland Kitchen RBBB   . Acute renal insufficiency     a. During adm 03/2014 for VT arrest.   Past Surgical History  Procedure Laterality Date  . Left heart catheterization with coronary angiogram N/A 03/12/2014    Procedure: LEFT HEART CATHETERIZATION WITH CORONARY ANGIOGRAM;  Surgeon: Iran Ouch, MD;  Location: [facility] CATH LAB;  Service: Cardiovascular;  Laterality: N/A;  . Bi-ventricular implantable cardioverter defibrillator N/A 03/17/2014    Procedure: BI-VENTRICULAR IMPLANTABLE  CARDIOVERTER DEFIBRILLATOR  (CRT-D);  Surgeon: Marinus Maw, MD;  Location: [facility] CATH LAB;  Service: Cardiovascular;  Laterality: N/A;   Family History  Problem Relation Age of Onset  . Hypertension Maternal Grandfather   . Stroke Father   . Hypertension Maternal Grandmother    History  Substance Use Topics  . Smoking status: Passive Smoke Exposure - Never Smoker  . Smokeless tobacco: Never Used     Comment: second hand as child  . Alcohol Use: No    Review of Systems  Constitutional: Negative for fever.  HENT: Negative for congestion.   Gastrointestinal: Negative for vomiting.  Genitourinary: Positive for flank pain.  All other systems reviewed and are negative.     Allergies  Benadryl  Home Medications   Prior to Admission medications   Medication Sig Start Date End Date Taking? Authorizing Provider  amiodarone (PACERONE) 400 MG tablet Take 0.5 tablets (200 mg total) by mouth daily. 05/05/14   Laurey Morale, MD  carvedilol (COREG) 6.25 MG tablet Take 1 tablet (6.25 mg total) by mouth 2 (two) times daily with a meal. 03/20/14   Janetta Hora, PA-C  pantoprazole (PROTONIX) 40 MG tablet Take 40 mg by mouth daily before breakfast.    Historical Provider, MD  predniSONE (DELTASONE) 20 MG tablet Decrease to 40 mg x 1 week, then 30 mg x 1 week, then 20 mg x 1 week, then 10 mg daily thereafter 05/05/14   Laurey Morale, MD  ramipril (ALTACE) 2.5 MG capsule Take 1 capsule (2.5 mg total) by mouth daily. 04/21/14   Kelle Darting  Hager, PA-C  zolpidem (AMBIEN) 10 MG tablet Take 1 tablet (10 mg total) by mouth at bedtime as needed for sleep. 04/15/14 05/15/14  Laurey Morale, MD   BP 135/87 mmHg  Pulse 60  Temp(Src) 97.6 F (36.4 C)  Resp 16  SpO2 100% Physical Exam  Constitutional: He is oriented to person, place, and time. He appears well-developed and well-nourished.  Cardiovascular: Normal rate and regular rhythm.   Pulmonary/Chest: Effort normal and breath sounds normal.   Abdominal: Soft. Bowel sounds are normal. There is no tenderness.  Musculoskeletal: Normal range of motion.  Neurological: He is alert and oriented to person, place, and time.  Skin: Skin is warm and dry.  Psychiatric: He has a normal mood and affect.  Nursing note and vitals reviewed.   ED Course  Procedures (including critical care time) Labs Review Labs Reviewed  URINALYSIS, ROUTINE W REFLEX MICROSCOPIC - Abnormal; Notable for the following:    Hgb urine dipstick LARGE (*)    All other components within normal limits  CBC WITH DIFFERENTIAL/PLATELET - Abnormal; Notable for the following:    WBC 15.0 (*)    Platelets 142 (*)    Neutrophils Relative % 93 (*)    Lymphocytes Relative 2 (*)    Neutro Abs 13.9 (*)    Lymphs Abs 0.3 (*)    All other components within normal limits  BASIC METABOLIC PANEL - Abnormal; Notable for the following:    Sodium 132 (*)    Glucose, Bld 141 (*)    GFR calc non Af Amer 64 (*)    GFR calc Af Amer 74 (*)    Anion gap 1 (*)    All other components within normal limits  URINE MICROSCOPIC-ADD ON    Imaging Review Ct Renal Stone Study  05/05/2014   CLINICAL DATA:  Left-sided flank pain for 12 hours, dysuria  EXAM: CT ABDOMEN AND PELVIS WITHOUT CONTRAST  TECHNIQUE: Multidetector CT imaging of the abdomen and pelvis was performed following the standard protocol without IV contrast.  COMPARISON:  07/11/2008, chest CT 11/29/2011  FINDINGS: Lower chest: Cardiac pacer leads partly visualized. Heart size upper limits of normal. Trace pericardial fluid is present. Left lower lobe 4 mm nodule is reidentified, not significantly changed from 11/29/2011 exam, compatible with a benign process.  Hepatobiliary: The liver and decompressed gallbladder are unremarkable.  Pancreas: Normal  Spleen: Normal  Adrenals/Urinary Tract: Adrenal glands and right kidney appear normal. There is moderate left hydroureteronephrosis to the level of a 5 mm distal left ureteral calculus  image 63. Mild left perinephric stranding/ fluid is identified. The bladder appears normal. No other radiopaque renal or right ureteral calculus is identified.  Stomach/Bowel: Normal  Vascular/Lymphatic: Mild atheromatous aortic calcification without aneurysm. No lymphadenopathy.  Reproductive: Normal  Other: No free fluid or free air.  Musculoskeletal: No acute osseous abnormality.  IMPRESSION: 5 mm distal left ureteral calculus producing moderate proximal left hydroureteronephrosis   Electronically Signed   By: Christiana Pellant M.D.   On: 05/05/2014 16:27     EKG Interpretation None      MDM   Final diagnoses:  Ureteral stone with hydronephrosis    No infection noted in urine. Discussed with pt return precaution and maybe a stone that won't pass. Pt verbalized understanding. Pt requesting one more dose of pain medication here. Will do and then send home with dilaudid and toradol. Pt given urology follow up with DR. Coralie Carpen, NP 05/05/14 1652  Riley Lam  [place], MD 05/06/14 4098

## 2014-05-07 ENCOUNTER — Encounter (HOSPITAL_COMMUNITY): Payer: Self-pay | Admitting: *Deleted

## 2014-06-13 ENCOUNTER — Ambulatory Visit (INDEPENDENT_AMBULATORY_CARE_PROVIDER_SITE_OTHER): Payer: 59 | Admitting: Adult Health

## 2014-06-13 ENCOUNTER — Encounter: Payer: Self-pay | Admitting: Adult Health

## 2014-06-13 VITALS — BP 124/72 | HR 58 | Temp 98.1°F | Ht 71.0 in | Wt 204.0 lb

## 2014-06-13 DIAGNOSIS — D860 Sarcoidosis of lung: Secondary | ICD-10-CM | POA: Diagnosis not present

## 2014-06-13 DIAGNOSIS — R059 Cough, unspecified: Secondary | ICD-10-CM

## 2014-06-13 DIAGNOSIS — D8685 Sarcoid myocarditis: Secondary | ICD-10-CM | POA: Diagnosis not present

## 2014-06-13 DIAGNOSIS — R05 Cough: Secondary | ICD-10-CM

## 2014-06-13 NOTE — Patient Instructions (Signed)
Continue on prednisone as directed from Cardiology .  Can discuss with Cardiology regarding cough from ACE Inhibitor -Altace.  Follow up Dr. Craige Cotta  In 2-3 months and As needed

## 2014-06-13 NOTE — Assessment & Plan Note (Signed)
Dry cough /throat clearing ? AR +/- ACE  May benefit from changing ACE to ARB , will leave to cards  Cough prevention regimen discussed

## 2014-06-13 NOTE — Assessment & Plan Note (Signed)
Doing well on current regimen  Cont prednisone per cards  follow up as planned

## 2014-06-13 NOTE — Progress Notes (Signed)
Reviewed and agree with assessment/plan. 

## 2014-06-13 NOTE — Progress Notes (Signed)
Subjective:    Patient ID: Christopher Patrick, male    DOB: 1965-10-18, 49 y.o.   MRN: [id number]  HPI  History of Present Illness: Christopher Patrick is a 49 y.o. male with pulmonary/cardiac sarcoidosis. S/p ICD from VT storm and cardiac arrest 03/2014   TESTS: Cardiac MRI 03/17/14 >> concerning for cardiac sarcoidosis Echo 04/21/14 >> mild LVH, EF 55 to 60% CT chest 04/21/14 >> 8 mm LLL nodule, 3 mm nodule RUL, 3 mm nodule RUL, 4 mm nodule RUL, 6 mm nodule RUL, 5 mm nodule lingula PFT 05/01/2014 >> FEV1 3.63 (90%), FEV1% 75, TLC 6.27 (87%), DLCO 75%, no BD Echo (1/16) with EF 15-20%, diffuse hypokinesis, RV moderately dilated with severe decreased systolic function (initially post-cardiac arrest).  - Cardiac MRI (1/16) with EF 46%, basal to mid anteroseptal and apical inferior hypokinesis, RV moderately dilated with mild to moderately decreased systolic function, RV EF 33%, patchy LGE in a pattern concerning for cardiac sarcoidosis.   06/13/2014 Follow up : pulmonary /cardiac sarcoid  Pt returns for 1 month follow up  Says he is doing well  Overall .  Seen by cards, Begun slow pred taper over last month, down to 10mg  daily . Plans to keep on 10mg  daily for at least 1 year per cards.  Does continue on to have minimal dry cough most days with throat clearing. No fever, discolored mucus , chest pain, dypsnea, rash .  Does still get tired easily . Starting to exercise lightly. No syncope or palpitations.  Had eye exam this month.  On Amiodarone and coreg per cards.   Review of Systems Constitutional:   No  weight loss, night sweats,  Fevers, chills, fatigue, or  lassitude.  HEENT:   No headaches,  Difficulty swallowing,  Tooth/dental problems, or  Sore throat,                No sneezing, itching, ear ache, nasal congestion,  +post nasal drip,   CV:  No chest pain,  Orthopnea, PND, swelling in lower extremities, anasarca, dizziness, palpitations, syncope.   GI  No heartburn,  indigestion, abdominal pain, nausea, vomiting, diarrhea, change in bowel habits, loss of appetite, bloody stools.   Resp: No shortness of breath with exertion or at rest.   No coughing up of blood.  No change in color of mucus.  No wheezing.  No chest wall deformity  Skin: no rash or lesions.  GU: no dysuria, change in color of urine, no urgency or frequency.  No flank pain, no hematuria   MS:  No joint pain or swelling.  No decreased range of motion.  No back pain.  Psych:  No change in mood or affect. No depression or anxiety.  No memory loss.         Objective:   Physical Exam GEN: A/Ox3; pleasant , NAD, well nourished   HEENT:  [place]/AT,  EACs-clear, TMs-wnl, NOSE-clear, THROAT-clear, no lesions, no postnasal drip or exudate noted.   NECK:  Supple w/ fair ROM; no JVD; normal carotid impulses w/o bruits; no thyromegaly or nodules palpated; no lymphadenopathy.  RESP  Clear  P & A; w/o, wheezes/ rales/ or rhonchi.no accessory muscle use, no dullness to percussion  CARD:  RRR, no m/r/g  , no peripheral edema, pulses intact, no cyanosis or clubbing.  GI:   Soft & nt; nml bowel sounds; no organomegaly or masses detected.  Musco: Warm bil, no deformities or joint swelling noted.   Neuro: alert, no  focal deficits noted.    Skin: Warm, no lesions or rashes        Assessment & Plan:

## 2014-06-13 NOTE — Assessment & Plan Note (Addendum)
Overall doing well  PFT show nml lung function  CT chest w/mild findings -consider repeat in 6 months   Plan  Cont current regimen  follow up in 2-3 months and As needed

## 2014-06-23 ENCOUNTER — Ambulatory Visit (INDEPENDENT_AMBULATORY_CARE_PROVIDER_SITE_OTHER): Payer: 59 | Admitting: Internal Medicine

## 2014-06-23 ENCOUNTER — Encounter: Payer: Self-pay | Admitting: Internal Medicine

## 2014-06-23 VITALS — BP 142/88 | HR 61 | Ht 71.0 in | Wt 209.4 lb

## 2014-06-23 DIAGNOSIS — Z95810 Presence of automatic (implantable) cardiac defibrillator: Secondary | ICD-10-CM

## 2014-06-23 DIAGNOSIS — I429 Cardiomyopathy, unspecified: Secondary | ICD-10-CM

## 2014-06-23 DIAGNOSIS — I472 Ventricular tachycardia, unspecified: Secondary | ICD-10-CM

## 2014-06-23 DIAGNOSIS — I442 Atrioventricular block, complete: Secondary | ICD-10-CM | POA: Diagnosis not present

## 2014-06-23 DIAGNOSIS — I469 Cardiac arrest, cause unspecified: Secondary | ICD-10-CM | POA: Diagnosis not present

## 2014-06-23 DIAGNOSIS — I428 Other cardiomyopathies: Secondary | ICD-10-CM

## 2014-06-23 LAB — MDC_IDC_ENUM_SESS_TYPE_INCLINIC
Battery Remaining Longevity: 70.8 mo
Brady Statistic RA Percent Paced: 87 %
Brady Statistic RV Percent Paced: 95 %
Date Time Interrogation Session: 20160418162642
HIGH POWER IMPEDANCE MEASURED VALUE: 57.375
Lead Channel Impedance Value: 400 Ohm
Lead Channel Impedance Value: 550 Ohm
Lead Channel Pacing Threshold Amplitude: 0.75 V
Lead Channel Pacing Threshold Amplitude: 1.25 V
Lead Channel Pacing Threshold Pulse Width: 0.5 ms
Lead Channel Sensing Intrinsic Amplitude: 11.4 mV
Lead Channel Setting Pacing Amplitude: 2 V
Lead Channel Setting Pacing Pulse Width: 0.5 ms
Lead Channel Setting Pacing Pulse Width: 0.5 ms
Lead Channel Setting Sensing Sensitivity: 2 mV
MDC IDC MSMT LEADCHNL LV PACING THRESHOLD AMPLITUDE: 0.75 V
MDC IDC MSMT LEADCHNL LV PACING THRESHOLD PULSEWIDTH: 0.5 ms
MDC IDC MSMT LEADCHNL RA IMPEDANCE VALUE: 375 Ohm
MDC IDC MSMT LEADCHNL RA SENSING INTR AMPL: 5 mV
MDC IDC MSMT LEADCHNL RV PACING THRESHOLD PULSEWIDTH: 0.5 ms
MDC IDC PG SERIAL: 7210309
MDC IDC SET LEADCHNL RA PACING AMPLITUDE: 2.375
MDC IDC SET LEADCHNL RV PACING AMPLITUDE: 2.5 V
MDC IDC SET ZONE DETECTION INTERVAL: 315 ms
Zone Setting Detection Interval: 260 ms
Zone Setting Detection Interval: 425 ms

## 2014-06-23 MED ORDER — AMIODARONE HCL 200 MG PO TABS: ORAL_TABLET | ORAL | Status: DC

## 2014-06-23 MED ORDER — RAMIPRIL 2.5 MG PO CAPS: 2.5000 mg | ORAL_CAPSULE | Freq: Every day | ORAL | Status: DC

## 2014-06-23 MED ORDER — CARVEDILOL 6.25 MG PO TABS: 6.2500 mg | ORAL_TABLET | Freq: Two times a day (BID) | ORAL | Status: DC

## 2014-06-23 NOTE — Assessment & Plan Note (Signed)
He has had no recurrent VT. He will reduce his amio to 200 mg Mon-Sat., and none on Sunday.

## 2014-06-23 NOTE — Patient Instructions (Signed)
Medication Instructions:  Your physician has recommended you make the following change in your medication: Don't take Amiodarone on Sundays.   Labwork: none  Testing/Procedures: none  Follow-Up: Your physician wants you to follow-up in: 3 months with Dr. Ladona Ridgel. You will receive a reminder letter in the mail two months in advance. If you don't receive a letter, please call our office to schedule the follow-up appointment.   Marland Kitchen

## 2014-06-23 NOTE — Assessment & Plan Note (Signed)
He appears to be conducting normally today. Would consider turning biV pacing off in the future.

## 2014-06-23 NOTE — Progress Notes (Signed)
HPI Mr. Dileonardo returns today for ongoing followup of his ICD. 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 He was initially found to have complete heart block and his VT was incessant and associated with severe LV dysfunction. He underwent ICD insertion and after amiodarone, he eventually settle down and his LV function improved. He has done well in the interim. He denies chest pain or sob. He does complain of fatigue and tiredness. He has returned to work. 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  Allergies  Allergen Reactions  . Benadryl [Diphenhydramine Hcl (Sleep)] Anaphylaxis     Current Outpatient Prescriptions  Medication Sig Dispense Refill  . amiodarone (PACERONE) 200 MG tablet Take one tablet by mouth daily except none on Sundays 90 tablet 3  . carvedilol (COREG) 6.25 MG tablet Take 1 tablet (6.25 mg total) by mouth 2 (two) times daily with a meal. 180 tablet 3  . pantoprazole (PROTONIX) 40 MG tablet Take 40 mg by mouth daily before breakfast.    . predniSONE (DELTASONE) 10 MG tablet Take 10 mg by mouth daily with breakfast.    . ramipril (ALTACE) 2.5 MG capsule Take 1 capsule (2.5 mg total) by mouth daily. 90 capsule 3   No current facility-administered medications for this visit.     Past Medical History  Diagnosis Date  . Pulmonary sarcoidosis   . GERD (gastroesophageal reflux disease)   . Cardiac arrest     a. VT arrest with recurrent VT 03/2014, s/p CPR, multiple defib. Ultimately felt 2/2 cardiac sarcoid. Placed on amiodarone. Hospitalization complicated by circulatory shock, VDRF, encephalopathy, AKI, and hiccups which improved by discharge.  . Recurrent ventricular tachycardia   . Complete heart block     a. s/p St Jude BiV ICD/pacemaker placement w/ BS leads 03/17/14  . Cardiac sarcoidosis   . Non-ischemic cardiomyopathy     a. EF15-20% by echo 03/12/14 - s/p BiV-ICD placement. Normal cors 03/2014.  Marland Kitchen RBBB     . Acute renal insufficiency     a. During adm 03/2014 for VT arrest.    ROS:   All systems reviewed and negative except as noted in the HPI.   Past Surgical History  Procedure Laterality Date  . Left heart catheterization with coronary angiogram N/A 03/12/2014    Procedure: LEFT HEART CATHETERIZATION WITH CORONARY ANGIOGRAM;  Surgeon: Iran Ouch, MD;  Location: [facility] CATH LAB;  Service: Cardiovascular;  Laterality: N/A;  . Bi-ventricular implantable cardioverter defibrillator N/A 03/17/2014    Procedure: BI-VENTRICULAR IMPLANTABLE CARDIOVERTER DEFIBRILLATOR  (CRT-D);  Surgeon: Marinus Maw, MD;  Location: [facility] CATH LAB;  Service: Cardiovascular;  Laterality: N/A;     Family History  Problem Relation Age of Onset  . Hypertension Maternal Grandfather   . Stroke Father   . Hypertension Maternal Grandmother      History   Social History  . Marital Status: Married    Spouse Name: N/A  . Number of Children: N/A  . Years of Education: N/A   Occupational History  . Emergency planning/management officer    Social History Main Topics  . Smoking status: Passive Smoke Exposure - Never Smoker  . Smokeless tobacco: Never Used     Comment: second hand as child  . Alcohol Use: No  . Drug Use: No  . Sexual Activity: Not on file   Other Topics Concern  . Not on file   Social History  Narrative     BP 142/88 mmHg  Pulse 61  Ht  (1.803 m)  Wt 209 lb 6.4 oz (94.983 kg)  BMI 29.22 kg/m2  SpO2 97%  Physical Exam:  Well appearing NAD HEENT: Unremarkable Neck:  No JVD, no thyromegally Back:  No CVA tenderness Lungs:  Clear with scattered rales, no wheezes or rhonchi HEART:  Regular rate rhythm, no murmurs, no rubs, no clicks Abd:  soft, positive bowel sounds, no organomegally, no rebound, no guarding Ext:  2 plus pulses, no edema, no cyanosis, no clubbing Skin:  No rashes no nodules Neuro:  CN II through XII intact, motor grossly intact  DEVICE  Normal device function.  See PaceArt for  details.   Assess/Plan:

## 2014-06-23 NOTE — Assessment & Plan Note (Signed)
His St. Jude ICD is working normally. Will recheck in several months.  

## 2014-06-23 NOTE — Assessment & Plan Note (Signed)
Repeat echo demonstrated normalization of his LV function. He will continue his current meds.

## 2014-07-30 ENCOUNTER — Telehealth: Payer: Self-pay | Admitting: Internal Medicine

## 2014-07-30 NOTE — Telephone Encounter (Signed)
Spoke with patient and he needs note to return to work.  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  Let him know I would  Have letter ready tomorrow

## 2014-07-30 NOTE — Telephone Encounter (Signed)
New Message   Patient states that he has been waiting for a clearance note for work. Please give patient a call.   [phone]

## 2014-08-01 ENCOUNTER — Encounter: Payer: Self-pay | Admitting: *Deleted

## 2014-09-12 ENCOUNTER — Ambulatory Visit (INDEPENDENT_AMBULATORY_CARE_PROVIDER_SITE_OTHER): Payer: Commercial Managed Care - HMO | Admitting: Pulmonary Disease

## 2014-09-12 ENCOUNTER — Encounter: Payer: Self-pay | Admitting: Pulmonary Disease

## 2014-09-12 VITALS — BP 132/78 | HR 60 | Temp 97.0°F | Ht 71.0 in | Wt 217.2 lb

## 2014-09-12 DIAGNOSIS — D860 Sarcoidosis of lung: Secondary | ICD-10-CM

## 2014-09-12 NOTE — Patient Instructions (Addendum)
Ask Dr. Shirlee Latch about whether there is another option besides altace Follow up in 4 months

## 2014-09-12 NOTE — Progress Notes (Signed)
Chief Complaint  Patient presents with  . Follow-up    Pt reports breathing is stable. Pt c/o prod cough (yellow-green phlem) but does not get phlem up all the time. Her has been on 3 rounds of amox since last seen 10 days each round. C/o wheezing as well.     History of Present Illness: Christopher Patrick is a 49 y.o. male with pulmonary/cardiac sarcoidosis.  He was doing okay until the past few weeks.  He got a bronchitis.  He has been on several course of Abx from PCP.  He has improved.  He still has a cough.  He was bringing up green/brown sputum and getting wheeze from his chest.  He denies fever, ear pain, sore throat, or abdominal symptoms.  He remains on prednisone 10 mg daily.  He denies rash, swelling, or joint pain.  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   Physical Exam: BP 132/78 mmHg  Pulse 60  Temp(Src) 97 F (36.1 C) (Oral)  Ht 5\' 11"  (1.803 m)  Wt 217 lb 3.2 oz (98.521 kg)  BMI 30.31 kg/m2  SpO2 97%  General - No distress ENT - No sinus tenderness, no oral exudate, no LAN Cardiac - s1s2 regular, no murmur Chest - No wheeze/rales/dullness Back - No focal tenderness Abd - Soft, non-tender Ext - No edema Neuro - Normal strength Skin - No rashes Psych - normal mood, and behavior   Assessment/Plan:  Pulmonary sarcoidosis. Does not seem to be active issue. Plan: - will discuss when to f/u imaging, pulmonary function testing at next visit  Cardiac sarcoidosis. Plan: - he will arrange for f/u with cardiology to discuss plans  for prednisone taper  Acute bronchitis. Improving. Plan: - defer additional abx at present - don't think he needs a chest xray at this time  Cough ?related to ACE inhibitor. Plan: - advised him to d/w cardiology about whether his ACE inhibitor can be changed   Coralyn Helling, MD [facility] Pager:  [phone]

## 2014-09-14 ENCOUNTER — Other Ambulatory Visit: Payer: Self-pay | Admitting: Physician Assistant

## 2014-09-15 ENCOUNTER — Other Ambulatory Visit: Payer: Self-pay

## 2014-09-15 DIAGNOSIS — I428 Other cardiomyopathies: Secondary | ICD-10-CM

## 2014-09-15 MED ORDER — RAMIPRIL 2.5 MG PO CAPS: 2.5000 mg | ORAL_CAPSULE | Freq: Every day | ORAL | Status: DC

## 2014-10-09 ENCOUNTER — Encounter (HOSPITAL_COMMUNITY): Payer: Self-pay

## 2014-10-09 ENCOUNTER — Encounter: Payer: Self-pay | Admitting: Internal Medicine

## 2014-10-09 ENCOUNTER — Ambulatory Visit (HOSPITAL_COMMUNITY)
Admission: RE | Admit: 2014-10-09 | Discharge: 2014-10-09 | Disposition: A | Discharge status: Home / Self Care | Payer: Commercial Managed Care - HMO | Source: Ambulatory Visit | Attending: Cardiology | Admitting: Cardiology

## 2014-10-09 VITALS — BP 152/104 | HR 92 | Wt 220.4 lb

## 2014-10-09 DIAGNOSIS — I472 Ventricular tachycardia, unspecified: Secondary | ICD-10-CM

## 2014-10-09 DIAGNOSIS — D8685 Sarcoid myocarditis: Secondary | ICD-10-CM | POA: Diagnosis not present

## 2014-10-09 DIAGNOSIS — Z7952 Long term (current) use of systemic steroids: Secondary | ICD-10-CM | POA: Insufficient documentation

## 2014-10-09 DIAGNOSIS — I5022 Chronic systolic (congestive) heart failure: Secondary | ICD-10-CM | POA: Diagnosis not present

## 2014-10-09 DIAGNOSIS — Z8674 Personal history of sudden cardiac arrest: Secondary | ICD-10-CM | POA: Diagnosis not present

## 2014-10-09 DIAGNOSIS — Z79899 Other long term (current) drug therapy: Secondary | ICD-10-CM | POA: Diagnosis not present

## 2014-10-09 DIAGNOSIS — K219 Gastro-esophageal reflux disease without esophagitis: Secondary | ICD-10-CM | POA: Diagnosis not present

## 2014-10-09 DIAGNOSIS — I428 Other cardiomyopathies: Secondary | ICD-10-CM

## 2014-10-09 DIAGNOSIS — D8689 Sarcoidosis of other sites: Secondary | ICD-10-CM | POA: Diagnosis present

## 2014-10-09 DIAGNOSIS — Z95810 Presence of automatic (implantable) cardiac defibrillator: Secondary | ICD-10-CM | POA: Diagnosis not present

## 2014-10-09 DIAGNOSIS — I429 Cardiomyopathy, unspecified: Secondary | ICD-10-CM | POA: Insufficient documentation

## 2014-10-09 DIAGNOSIS — I442 Atrioventricular block, complete: Secondary | ICD-10-CM | POA: Diagnosis not present

## 2014-10-09 LAB — COMPREHENSIVE METABOLIC PANEL
ALT: 20 U/L (ref 17–63)
ANION GAP: 7 (ref 5–15)
AST: 22 U/L (ref 15–41)
Albumin: 3.9 g/dL (ref 3.5–5.0)
Alkaline Phosphatase: 49 U/L (ref 38–126)
BUN: 18 mg/dL (ref 6–20)
CO2: 30 mmol/L (ref 22–32)
Calcium: 9.3 mg/dL (ref 8.9–10.3)
Chloride: 104 mmol/L (ref 101–111)
Creatinine, Ser: 1.16 mg/dL (ref 0.61–1.24)
GFR calc Af Amer: >60 mL/min (ref >60)
GFR calc non Af Amer: >60 mL/min (ref >60)
GLUCOSE: 109 mg/dL — AB (ref 65–99)
POTASSIUM: 4.7 mmol/L (ref 3.5–5.1)
SODIUM: 141 mmol/L (ref 135–145)
TOTAL PROTEIN: 5.9 g/dL — AB (ref 6.5–8.1)
Total Bilirubin: 1 mg/dL (ref 0.3–1.2)

## 2014-10-09 LAB — TSH: TSH: 2.168 u[IU]/mL (ref 0.350–4.500)

## 2014-10-09 NOTE — Patient Instructions (Signed)
Labs today  Your physician recommends that you schedule a follow-up appointment and echocardiogram in: Feb 2017

## 2014-10-11 NOTE — Progress Notes (Signed)
Patient ID: Christopher Patrick, male   DOB: 1965/12/13, 49 y.o.   MRN: [id number] PCP: Dr. Leonette Most  49 yo with history of pulmonary sarcoidosis was admitted to [facility] in 1/16 after a VT arrest with 20 minutes CPR and defibrillation x 3.  He had therapeutic hypothermia.  He had good neurologic recovery.  He developed VT storm and ended up getting St Jude BiV ICD and starting amiodarone.  LHC showed no significant coronary disease.  Echo initially after arrest showed EF 15-20%, but cardiac MRI a week or so later showed EF 46% with LGE pattern concerning for cardiac sarcoidosis.  Patient also had complete heart block (treated with St Jude BiV ICD).  The cause of VT and CHB was thought to be cardiac sarcoidosis.  He was started on prednisone 60 mg daily and is following with pulmonary (Dr Craige Cotta).    He was readmitted in 2/16 with presyncope. After extensive evaluation, this was thought to be vagal in origin most likely.  No evidence for arrhythmia by device interrogation.  Echo showed EF improved back to 55-60% with mild RV dilation.  CTA chest showed no PE. Ramipril was decreased to 2.5 mg daily.   Patient has had no further syncope and no lightheadedness.  He is doing well, no exertional dypsnea and no chest pain.  PFTs in 2/16 were minimally abnormal.  He has gained some weight with prednisone use (it has been cut back to 10 mg daily).    Labs (1/16): K 4.1, creatinine 1.18, LFTs normal, HCT 42.5, plts 91K Labs (2/16): LFTs normal, K 3.8, creatinine 1.19, TSH normal, LDL 129  PMH: 1. Pulmonary sarcoidosis: Diagnosed around 2011.  PFTs (2/16) with minimal abnormality.  2. 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   - Echo (1/16) with EF 15-20%, diffuse hypokinesis, RV moderately dilated with severe decreased systolic function (initially post-cardiac arrest).  - Cardiac MRI (1/16) with EF 46%, basal to mid anteroseptal and apical inferior hypokinesis, RV moderately dilated with  mild to moderately decreased systolic function, RV EF 33%, patchy LGE in a pattern concerning for cardiac sarcoidosis.  - Echo (2/16) with EF 55-60%, mild RV dilation.  3. Ventricular tachycardia: Admission 1/16 with cardiac arrest and VT storm. Suspect due to cardiac sarcoidosis.  - LHC (1/16) with normal coronaries.  - St Jude BiV ICD placed, amiodarone started.  4. Complete heart block: Likely due to cardiac sarcoidosis.  St Jude BiV ICD placed.  5. GERD 6. Pre-syncope: 2/16, possibly vagal event.   SH: Emergency planning/management officer, lives in [place], nonsmoker, married  FH: Grandfather with CHF  ROS: All systems reviewed and negative except as per HPI.   Current Outpatient Prescriptions  Medication Sig Dispense Refill  . amiodarone (PACERONE) 200 MG tablet Take one tablet by mouth daily except none on Sundays 90 tablet 3  . carvedilol (COREG) 6.25 MG tablet Take 1 tablet (6.25 mg total) by mouth 2 (two) times daily with a meal. 180 tablet 3  . predniSONE (DELTASONE) 10 MG tablet Take 10 mg by mouth daily with breakfast.    . ramipril (ALTACE) 2.5 MG capsule Take 1 capsule (2.5 mg total) by mouth daily. 90 capsule 3   No current facility-administered medications for this encounter.   BP 152/104 mmHg  Pulse 92  Wt 220 lb 6.4 oz (99.973 kg)  SpO2 99% General: NAD Neck: No JVD, no thyromegaly or thyroid nodule.  Lungs: Clear to auscultation bilaterally with normal respiratory effort. CV: Nondisplaced PMI.  Heart regular S1/S2, no S3/S4, no murmur.  No peripheral edema.  No carotid bruit.  Normal pedal pulses.  Abdomen: Soft, nontender, no hepatosplenomegaly, no distention.  Skin: Intact without lesions or rashes.  Neurologic: Alert and oriented x 3.  Psych: Normal affect. Extremities: No clubbing or cyanosis.  HEENT: Normal.   Assessment/Plan: 1. Cardiac sarcoidosis: I suspect this is the etiology of cardiomyopathy, VT, and complete heart block.  He is on prednisone 10 mg daily now. Most  recent echo in 2/16 showed EF improved to normal range.  Pulmonary sarcoidosis is quiescent. - Continue prednisone at 10 mg daily until 2/17.  At that time, I will repeat an echo.  If EF remains normal, I will likely have him taper off steroids.   2. Chronic systolic CHF: Initial EF 15-20% in hospital, suspect this was due to stunning from cardiac arrest.  Cardiac MRI a week or so later showed LV EF 46% with moderate RV systolic dysfunction. EF back to 55-60% on most recent echo in 2/16. He looks euvolemic on exam today.  NYHA class I-II symptoms.  - Continue current Coreg and ramipril. 3. VT: Has ICD, on amiodarone.  Check LFTs and TSH.  Will need at least yearly eye exam while on amiodarone. Continue amiodarone 200 mg daily.   4. Complete heart block: St Jude CRT, to be followed by Dr Ladona Ridgel.   Marca Ancona 10/11/2014

## 2014-12-11 ENCOUNTER — Other Ambulatory Visit: Payer: Self-pay | Admitting: Physician Assistant

## 2014-12-11 ENCOUNTER — Telehealth: Payer: Self-pay | Admitting: Pulmonary Disease

## 2014-12-11 DIAGNOSIS — I428 Other cardiomyopathies: Secondary | ICD-10-CM

## 2014-12-11 MED ORDER — PREDNISONE 10 MG PO TABS: 10.0000 mg | ORAL_TABLET | Freq: Every day | ORAL | Status: DC

## 2014-12-11 NOTE — Telephone Encounter (Signed)
Spoke with pt, states that after calling us he realized this med was filled by cardio and not pulm.  Nothing further needed.

## 2014-12-11 NOTE — Telephone Encounter (Signed)
lmtcb x1 for pt. 

## 2014-12-11 NOTE — Telephone Encounter (Signed)
Re-ordered prednisone 10 mg daily # 90 two refills

## 2014-12-11 NOTE — Telephone Encounter (Signed)
Pt returned call.Christopher Patrick ° °

## 2014-12-23 ENCOUNTER — Encounter: Payer: Self-pay | Admitting: Internal Medicine

## 2014-12-25 ENCOUNTER — Ambulatory Visit (INDEPENDENT_AMBULATORY_CARE_PROVIDER_SITE_OTHER): Payer: Commercial Managed Care - HMO | Admitting: *Deleted

## 2014-12-25 DIAGNOSIS — I428 Other cardiomyopathies: Secondary | ICD-10-CM

## 2014-12-25 DIAGNOSIS — I429 Cardiomyopathy, unspecified: Secondary | ICD-10-CM

## 2014-12-25 DIAGNOSIS — I469 Cardiac arrest, cause unspecified: Secondary | ICD-10-CM | POA: Diagnosis not present

## 2014-12-25 DIAGNOSIS — I472 Ventricular tachycardia, unspecified: Secondary | ICD-10-CM

## 2014-12-26 ENCOUNTER — Telehealth: Payer: Self-pay | Admitting: *Deleted

## 2014-12-26 NOTE — Telephone Encounter (Signed)
LMOVM w/ direct # to device clinic.   Re: ATPx2 on 12/25/14 at 15:15.

## 2014-12-26 NOTE — Telephone Encounter (Signed)
Pt was using a chainsaw at time of episode. Asymptomatic. No SOB, presyncope, or palps. I advised pt we highly recommend never using a chainsaw again. I stated he can split wood but not use a chainsaw. Pt expressed understanding. Pt did receive appropriate ATPx2. Pt did not feel any delivered Tx. We had a discussion about primary & secondary prevention as to why his VT occurred and why he was implanted.   Pt aware no driving H7CB. Pt aware we will call him back if programming changes or medicinal changes are necessary.

## 2014-12-29 NOTE — Progress Notes (Signed)
Remote ICD transmission.   

## 2014-12-31 ENCOUNTER — Encounter: Payer: Self-pay | Admitting: Cardiology

## 2014-12-31 LAB — CUP PACEART REMOTE DEVICE CHECK
Battery Remaining Longevity: 68 mo
Battery Voltage: 3.01 V
Brady Statistic AP VP Percent: 89 %
Brady Statistic AP VS Percent: 1.8 %
Brady Statistic AS VP Percent: 2.6 %
Brady Statistic RA Percent Paced: 87 %
Date Time Interrogation Session: 20161020221112
HighPow Impedance: 73 Ohm
HighPow Impedance: 73 Ohm
Implantable Lead Implant Date: 20160111
Implantable Lead Implant Date: 20160111
Implantable Lead Implant Date: 20160111
Implantable Lead Location: 753859
Implantable Lead Location: 753860
Implantable Lead Serial Number: 326242
Lead Channel Impedance Value: 400 Ohm
Lead Channel Impedance Value: 460 Ohm
Lead Channel Impedance Value: 590 Ohm
Lead Channel Pacing Threshold Pulse Width: 0.5 ms
Lead Channel Sensing Intrinsic Amplitude: 10.9 mV
Lead Channel Setting Pacing Amplitude: 2 V
Lead Channel Setting Pacing Amplitude: 2.5 V
Lead Channel Setting Pacing Amplitude: 2.5 V
Lead Channel Setting Pacing Pulse Width: 0.5 ms
MDC IDC LEAD LOCATION: 753858
MDC IDC LEAD MODEL: 181
MDC IDC MSMT BATTERY REMAINING PERCENTAGE: 84 %
MDC IDC MSMT LEADCHNL RA PACING THRESHOLD AMPLITUDE: 1.5 V
MDC IDC MSMT LEADCHNL RA SENSING INTR AMPL: 5 mV
MDC IDC SET LEADCHNL RV PACING PULSEWIDTH: 0.5 ms
MDC IDC SET LEADCHNL RV SENSING SENSITIVITY: 2 mV
MDC IDC STAT BRADY AS VS PERCENT: 1.8 %
Pulse Gen Serial Number: 7210309

## 2015-01-07 ENCOUNTER — Encounter: Payer: Self-pay | Admitting: Internal Medicine

## 2015-01-07 ENCOUNTER — Ambulatory Visit (HOSPITAL_COMMUNITY)
Admission: RE | Admit: 2015-01-07 | Discharge: 2015-01-07 | Disposition: A | Discharge status: Home / Self Care | Payer: Commercial Managed Care - HMO | Source: Ambulatory Visit | Attending: Cardiology | Admitting: Cardiology

## 2015-01-07 DIAGNOSIS — I5022 Chronic systolic (congestive) heart failure: Secondary | ICD-10-CM

## 2015-01-07 DIAGNOSIS — I428 Other cardiomyopathies: Secondary | ICD-10-CM | POA: Diagnosis not present

## 2015-01-07 LAB — HEPATIC FUNCTION PANEL
ALT: 20 U/L (ref 17–63)
AST: 22 U/L (ref 15–41)
Albumin: 3.6 g/dL (ref 3.5–5.0)
Alkaline Phosphatase: 45 U/L (ref 38–126)
BILIRUBIN INDIRECT: 0.9 mg/dL (ref 0.3–0.9)
Bilirubin, Direct: 0.1 mg/dL (ref 0.1–0.5)
TOTAL PROTEIN: 5.6 g/dL — AB (ref 6.5–8.1)
Total Bilirubin: 1 mg/dL (ref 0.3–1.2)

## 2015-01-07 LAB — TSH: TSH: 3.598 u[IU]/mL (ref 0.350–4.500)

## 2015-01-14 ENCOUNTER — Encounter: Payer: Self-pay | Admitting: Cardiology

## 2015-02-24 ENCOUNTER — Encounter: Payer: Self-pay | Admitting: Internal Medicine

## 2015-02-24 ENCOUNTER — Other Ambulatory Visit: Payer: Self-pay

## 2015-02-24 ENCOUNTER — Ambulatory Visit (INDEPENDENT_AMBULATORY_CARE_PROVIDER_SITE_OTHER): Payer: Commercial Managed Care - HMO | Admitting: Internal Medicine

## 2015-02-24 VITALS — BP 138/86 | HR 60 | Ht 71.0 in | Wt 228.2 lb

## 2015-02-24 DIAGNOSIS — I472 Ventricular tachycardia, unspecified: Secondary | ICD-10-CM

## 2015-02-24 DIAGNOSIS — I429 Cardiomyopathy, unspecified: Secondary | ICD-10-CM

## 2015-02-24 DIAGNOSIS — I469 Cardiac arrest, cause unspecified: Secondary | ICD-10-CM

## 2015-02-24 DIAGNOSIS — I442 Atrioventricular block, complete: Secondary | ICD-10-CM

## 2015-02-24 DIAGNOSIS — D8685 Sarcoid myocarditis: Secondary | ICD-10-CM

## 2015-02-24 DIAGNOSIS — Z95810 Presence of automatic (implantable) cardiac defibrillator: Secondary | ICD-10-CM

## 2015-02-24 DIAGNOSIS — I428 Other cardiomyopathies: Secondary | ICD-10-CM

## 2015-02-24 LAB — CUP PACEART INCLINIC DEVICE CHECK
Brady Statistic RV Percent Paced: 92 %
Date Time Interrogation Session: 20161220170400
HIGH POWER IMPEDANCE MEASURED VALUE: 78.75 Ohm
Implantable Lead Implant Date: 20160111
Implantable Lead Location: 753858
Implantable Lead Location: 753859
Implantable Lead Location: 753860
Implantable Lead Model: 181
Implantable Lead Serial Number: 326242
Lead Channel Impedance Value: 550 Ohm
Lead Channel Impedance Value: 587.5 Ohm
Lead Channel Pacing Threshold Amplitude: 0.75 V
Lead Channel Pacing Threshold Amplitude: 1 V
Lead Channel Pacing Threshold Amplitude: 1 V
Lead Channel Pacing Threshold Pulse Width: 0.5 ms
Lead Channel Pacing Threshold Pulse Width: 0.5 ms
Lead Channel Pacing Threshold Pulse Width: 0.5 ms
Lead Channel Pacing Threshold Pulse Width: 0.5 ms
Lead Channel Sensing Intrinsic Amplitude: 11.4 mV
Lead Channel Sensing Intrinsic Amplitude: 5 mV
Lead Channel Setting Pacing Amplitude: 2 V
Lead Channel Setting Pacing Amplitude: 2.25 V
Lead Channel Setting Pacing Pulse Width: 0.5 ms
Lead Channel Setting Sensing Sensitivity: 2 mV
MDC IDC LEAD IMPLANT DT: 20160111
MDC IDC LEAD IMPLANT DT: 20160111
MDC IDC MSMT BATTERY REMAINING LONGEVITY: 66
MDC IDC MSMT LEADCHNL RA IMPEDANCE VALUE: 412.5 Ohm
MDC IDC MSMT LEADCHNL RA PACING THRESHOLD AMPLITUDE: 1.25 V
MDC IDC MSMT LEADCHNL RV PACING THRESHOLD AMPLITUDE: 0.75 V
MDC IDC MSMT LEADCHNL RV PACING THRESHOLD PULSEWIDTH: 0.5 ms
MDC IDC SET LEADCHNL RV PACING AMPLITUDE: 2.5 V
MDC IDC SET LEADCHNL RV PACING PULSEWIDTH: 0.5 ms
MDC IDC STAT BRADY RA PERCENT PACED: 85 %
Pulse Gen Serial Number: 7210309

## 2015-02-24 MED ORDER — CARVEDILOL 6.25 MG PO TABS: 6.2500 mg | ORAL_TABLET | Freq: Two times a day (BID) | ORAL | Status: DC

## 2015-02-24 MED ORDER — AMIODARONE HCL 200 MG PO TABS: ORAL_TABLET | ORAL | Status: DC

## 2015-02-24 MED ORDER — RAMIPRIL 2.5 MG PO CAPS: 2.5000 mg | ORAL_CAPSULE | Freq: Every day | ORAL | Status: DC

## 2015-02-24 NOTE — Assessment & Plan Note (Signed)
His St. Jude device is working normally. Will recheck in several months.  

## 2015-02-24 NOTE — Assessment & Plan Note (Signed)
He has had one episode of VT since his device implant with successful ATP. He will continue his amio and I have cautioned him that he is not supposed to drive for 6 months from the episode in October.

## 2015-02-24 NOTE — Progress Notes (Signed)
HPI Christopher Patrick returns today for ongoing followup of his ICD. 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 He was initially found to have complete heart block and his VT was incessant and associated with severe LV dysfunction. He underwent ICD insertion and after amiodarone, he eventually settle down and his LV function improved. He has done well in the interim. He denies chest pain or sob. He does complain of fatigue and tiredness. He has returned to work. 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 He was working in his yard and has felt dizzy. He was noted to have VT which was successfully terminated with ATP. No syncope. Allergies  Allergen Reactions  . Benadryl [Diphenhydramine Hcl (Sleep)] Anaphylaxis  . Diphenhydramine Swelling     Current Outpatient Prescriptions  Medication Sig Dispense Refill  . amiodarone (PACERONE) 200 MG tablet Take one tablet by mouth daily except none on Sundays 90 tablet 3  . carvedilol (COREG) 6.25 MG tablet Take 1 tablet (6.25 mg total) by mouth 2 (two) times daily with a meal. 180 tablet 3  . predniSONE (DELTASONE) 10 MG tablet Take 1 tablet (10 mg total) by mouth daily with breakfast. 90 tablet 2  . ramipril (ALTACE) 2.5 MG capsule Take 1 capsule (2.5 mg total) by mouth daily. 90 capsule 3   No current facility-administered medications for this visit.     Past Medical History  Diagnosis Date  . Pulmonary sarcoidosis (HCC)   . GERD (gastroesophageal reflux disease)   . Cardiac arrest (HCC)     a. VT arrest with recurrent VT 03/2014, s/p CPR, multiple defib. Ultimately felt 2/2 cardiac sarcoid. Placed on amiodarone. Hospitalization complicated by circulatory shock, VDRF, encephalopathy, AKI, and hiccups which improved by discharge.  . Recurrent ventricular tachycardia (HCC)   . Complete heart block (HCC)     a. s/p St Jude BiV ICD/pacemaker placement w/ BS leads 03/17/14  . Cardiac sarcoidosis (HCC)    . Non-ischemic cardiomyopathy (HCC)     a. EF15-20% by echo 03/12/14 - s/p BiV-ICD placement. Normal cors 03/2014.  Marland Kitchen RBBB   . Acute renal insufficiency     a. During adm 03/2014 for VT arrest.    ROS:   All systems reviewed and negative except as noted in the HPI.   Past Surgical History  Procedure Laterality Date  . Left heart catheterization with coronary angiogram N/A 03/12/2014    Procedure: LEFT HEART CATHETERIZATION WITH CORONARY ANGIOGRAM;  Surgeon: Iran Ouch, MD;  Location: [facility] CATH LAB;  Service: Cardiovascular;  Laterality: N/A;  . Bi-ventricular implantable cardioverter defibrillator N/A 03/17/2014    Procedure: BI-VENTRICULAR IMPLANTABLE CARDIOVERTER DEFIBRILLATOR  (CRT-D);  Surgeon: Marinus Maw, MD;  Location: [facility] CATH LAB;  Service: Cardiovascular;  Laterality: N/A;     Family History  Problem Relation Age of Onset  . Hypertension Maternal Grandfather   . Stroke Father   . Hypertension Maternal Grandmother      Social History   Social History  . Marital Status: Married    Spouse Name: N/A  . Number of Children: N/A  . Years of Education: N/A   Occupational History  . Emergency planning/management officer    Social History Main Topics  . Smoking status: Passive Smoke Exposure - Never Smoker  . Smokeless tobacco: Never Used     Comment: second hand as child  . Alcohol Use: No  . Drug Use: No  .  Sexual Activity: Not on file   Other Topics Concern  . Not on file   Social History Narrative     BP 138/86 mmHg  Pulse 60  Ht  (1.803 m)  Wt 228 lb 3.2 oz (103.511 kg)  BMI 31.84 kg/m2  Physical Exam:  Well appearing 49 yo man, NAD HEENT: Unremarkable Neck:  6 cm JVD, no thyromegally Back:  No CVA tenderness Lungs:  Clear with scattered rales, no wheezes or rhonchi HEART:  Regular rate rhythm, no murmurs, no rubs, no clicks Abd:  soft, positive bowel sounds, no organomegally, no rebound, no guarding Ext:  2 plus pulses, no edema, no cyanosis, no  clubbing Skin:  No rashes no nodules Neuro:  CN II through XII intact, motor grossly intact  DEVICE  Normal device function.  See PaceArt for details.   Assess/Plan:

## 2015-02-24 NOTE — Patient Instructions (Signed)
Medication Instructions:  Your physician recommends that you continue on your current medications as directed. Please refer to the Current Medication list given to you today.   Labwork: None ordered   Testing/Procedures: None ordered   Follow-Up: Remote monitoring is used to monitor your ICD from home. This monitoring reduces the number of office visits required to check your device to one time per year. It allows [place] to keep an eye on the functioning of your device to ensure it is working properly. You are scheduled for a device check from home on 05/26/15. You may send your transmission at any time that day. If you have a wireless device, the transmission will be sent automatically. After your physician reviews your transmission, you will receive a postcard with your next transmission date.   Your physician wants you to follow-up in: 12 months with Dr Court Joy will receive a reminder letter in the mail two months in advance. If you don't receive a letter, please call our office to schedule the follow-up appointment.   Any Other Special Instructions Will Be Listed Below (If Applicable).     If you need a refill on your cardiac medications before your next appointment, please call your pharmacy.

## 2015-02-24 NOTE — Assessment & Plan Note (Signed)
He has had improvement in his own conduction. Will follow.

## 2015-02-24 NOTE — Assessment & Plan Note (Signed)
He remains on prednisone. I will defer the decision to stop prednisone to Dr. Shirlee Latch.

## 2015-03-16 ENCOUNTER — Encounter: Payer: Self-pay | Admitting: Internal Medicine

## 2015-03-23 ENCOUNTER — Telehealth: Payer: Self-pay | Admitting: Cardiology

## 2015-03-23 NOTE — Telephone Encounter (Signed)
Spoke w/ pt and requested that he send a manual transmission b/c his home monitor has not updated in at least 8 days. Pt verbalized understanding.   

## 2015-03-24 ENCOUNTER — Telehealth: Payer: Self-pay | Admitting: *Deleted

## 2015-03-24 DIAGNOSIS — I472 Ventricular tachycardia, unspecified: Secondary | ICD-10-CM

## 2015-03-24 MED ORDER — AMIODARONE HCL 200 MG PO TABS: ORAL_TABLET | ORAL | Status: DC

## 2015-03-24 NOTE — Telephone Encounter (Signed)
Reviewed episode with Dr. Ladona Ridgel- he gave verbal order to increase amiodarone to 200mg  daily 7 days/week. Called patient and made him aware- he verbalizes understanding.

## 2015-03-24 NOTE — Telephone Encounter (Signed)
Spoke with patient regarding transmission received yesterday. Episode on the evening of 03/15/15 requiring ATP- successful. Pt made aware of driving restrictions x 6 months. He reports no symptoms, not missing any doses of medication. Will review with Dr. Ladona Ridgel and call pt back with any recommendations.

## 2015-03-27 ENCOUNTER — Ambulatory Visit (INDEPENDENT_AMBULATORY_CARE_PROVIDER_SITE_OTHER): Payer: Commercial Managed Care - HMO | Admitting: Pulmonary Disease

## 2015-03-27 ENCOUNTER — Encounter: Payer: Self-pay | Admitting: Pulmonary Disease

## 2015-03-27 VITALS — BP 128/82 | HR 74 | Ht 71.0 in | Wt 225.0 lb

## 2015-03-27 DIAGNOSIS — D860 Sarcoidosis of lung: Secondary | ICD-10-CM

## 2015-03-27 NOTE — Progress Notes (Signed)
Current Outpatient Prescriptions on File Prior to Visit  Medication Sig  . amiodarone (PACERONE) 200 MG tablet Take one tablet by mouth daily  . carvedilol (COREG) 6.25 MG tablet Take 1 tablet (6.25 mg total) by mouth 2 (two) times daily with a meal.  . predniSONE (DELTASONE) 10 MG tablet Take 1 tablet (10 mg total) by mouth daily with breakfast.  . ramipril (ALTACE) 2.5 MG capsule Take 1 capsule (2.5 mg total) by mouth daily.   No current facility-administered medications on file prior to visit.     Chief Complaint  Patient presents with  . Follow-up    Pt c/o URI x 1 week ago - cough with green mucus production. Currently taking Pred 10mg  daily. Denies SOB, chest tightness/CP, wheeze.      Tests Cardiac MRI 03/17/14 >> concerning for cardiac sarcoidosis Echo 04/21/14 >> mild LVH, EF 55 to 60% CT chest 04/21/14 >> 8 mm LLL nodule, 3 mm nodule RUL, 3 mm nodule RUL, 4 mm nodule RUL, 6 mm nodule RUL, 5 mm nodule lingula PFT 05/01/2014 >> FEV1 3.63 (90%), FEV1% 75, TLC 6.27 (87%), DLCO 75%, no BD  Past medical hx GERD, VT, Heart block s/p ICD/PM, non ischemic CM from sarcoidosis, RBBB  Past surgical hx, Allergies, Family hx, Social hx all reviewed.  Vital Signs BP 128/82 mmHg  Pulse 74  Ht 5\' 11"  (1.803 m)  Wt 225 lb (102.059 kg)  BMI 31.39 kg/m2  SpO2 96%  History of Present Illness Christopher Patrick is a 50 y.o. male with pulmonary and cardiac sarcoidosis.  He was doing well until about a week ago.  He developed sinus congestion, post nasal drip, cough, and clear to green sputum.  He has not had fever, chest pain, or wheeze.  He remains on prednisone 10 mg daily.  Physical Exam  General - No distress ENT - No sinus tenderness, no oral exudate, no LAN Cardiac - s1s2 regular, no murmur Chest - No wheeze/rales/dullness Back - No focal tenderness Abd - Soft, non-tender Ext - No edema Neuro - Normal strength Skin - No rashes Psych - normal mood, and  behavior   Assessment/Plan  Upper respiratory infection >> likely viral. Plan: - continue symptomatic therapy  Pulmonary sarcoidosis. Plan: - defer prednisone to cardiology  Cardiac sarcoidosis. Plan: - he will call to schedule follow up with Dr. Shirlee Latch in February   Patient Instructions  Follow up in 6 months     Coralyn Helling, MD [facility] Pager:  [phone]

## 2015-03-27 NOTE — Patient Instructions (Signed)
Follow up in 6 months 

## 2015-04-08 ENCOUNTER — Telehealth (HOSPITAL_COMMUNITY): Payer: Self-pay

## 2015-04-08 NOTE — Telephone Encounter (Signed)
Ok to continue until office visit

## 2015-04-08 NOTE — Telephone Encounter (Signed)
Patient has been on prednisone over a year Recalls he was told he only needed to be on it a year. OV in 3 weeks. The patient ask, "Is it ok that he continue it for another 3 weeks"?

## 2015-04-09 NOTE — Telephone Encounter (Signed)
Patient told that Dr. Shirlee Latch said that it is ok to continue prednisone until office visit in 3 weeks

## 2015-04-25 ENCOUNTER — Encounter: Payer: Self-pay | Admitting: Internal Medicine

## 2015-04-27 ENCOUNTER — Telehealth: Payer: Self-pay | Admitting: *Deleted

## 2015-04-27 ENCOUNTER — Ambulatory Visit (HOSPITAL_BASED_OUTPATIENT_CLINIC_OR_DEPARTMENT_OTHER)
Admission: RE | Admit: 2015-04-27 | Discharge: 2015-04-27 | Disposition: A | Discharge status: Home / Self Care | Payer: Commercial Managed Care - HMO | Source: Ambulatory Visit | Attending: Cardiology | Admitting: Cardiology

## 2015-04-27 ENCOUNTER — Ambulatory Visit (HOSPITAL_COMMUNITY)
Admission: RE | Admit: 2015-04-27 | Discharge: 2015-04-27 | Disposition: A | Discharge status: Home / Self Care | Payer: Commercial Managed Care - HMO | Source: Ambulatory Visit | Attending: Cardiology | Admitting: Cardiology

## 2015-04-27 ENCOUNTER — Encounter (HOSPITAL_COMMUNITY): Payer: Self-pay

## 2015-04-27 VITALS — BP 124/62 | HR 60 | Resp 18 | Wt 225.2 lb

## 2015-04-27 DIAGNOSIS — I340 Nonrheumatic mitral (valve) insufficiency: Secondary | ICD-10-CM | POA: Diagnosis not present

## 2015-04-27 DIAGNOSIS — D8685 Sarcoid myocarditis: Secondary | ICD-10-CM

## 2015-04-27 DIAGNOSIS — I351 Nonrheumatic aortic (valve) insufficiency: Secondary | ICD-10-CM | POA: Insufficient documentation

## 2015-04-27 DIAGNOSIS — I429 Cardiomyopathy, unspecified: Secondary | ICD-10-CM

## 2015-04-27 DIAGNOSIS — I428 Other cardiomyopathies: Secondary | ICD-10-CM

## 2015-04-27 DIAGNOSIS — I472 Ventricular tachycardia, unspecified: Secondary | ICD-10-CM

## 2015-04-27 DIAGNOSIS — I517 Cardiomegaly: Secondary | ICD-10-CM | POA: Diagnosis not present

## 2015-04-27 DIAGNOSIS — I509 Heart failure, unspecified: Secondary | ICD-10-CM | POA: Insufficient documentation

## 2015-04-27 LAB — COMPREHENSIVE METABOLIC PANEL
ALT: 22 U/L (ref 17–63)
AST: 20 U/L (ref 15–41)
Albumin: 4.2 g/dL (ref 3.5–5.0)
Alkaline Phosphatase: 47 U/L (ref 38–126)
Anion gap: 12 (ref 5–15)
BILIRUBIN TOTAL: 0.8 mg/dL (ref 0.3–1.2)
BUN: 19 mg/dL (ref 6–20)
CO2: 24 mmol/L (ref 22–32)
CREATININE: 1.21 mg/dL (ref 0.61–1.24)
Calcium: 9.7 mg/dL (ref 8.9–10.3)
Chloride: 105 mmol/L (ref 101–111)
GFR calc Af Amer: >60 mL/min (ref >60)
GFR calc non Af Amer: >60 mL/min (ref >60)
GLUCOSE: 89 mg/dL (ref 65–99)
Potassium: 4 mmol/L (ref 3.5–5.1)
Sodium: 141 mmol/L (ref 135–145)
TOTAL PROTEIN: 6.3 g/dL — AB (ref 6.5–8.1)

## 2015-04-27 LAB — TSH: TSH: 2.1 u[IU]/mL (ref 0.350–4.500)

## 2015-04-27 MED ORDER — PREDNISONE 5 MG PO TABS: 5.0000 mg | ORAL_TABLET | Freq: Every day | ORAL | Status: DC

## 2015-04-27 MED ORDER — CARVEDILOL 6.25 MG PO TABS: 9.3750 mg | ORAL_TABLET | Freq: Two times a day (BID) | ORAL | Status: DC

## 2015-04-27 NOTE — Patient Instructions (Signed)
Increase Carvedilol to 9.375 mg (1 & 1/2 tabs) Twice daily   Decrease Prednisone to 5 mg daily for 1 month then decrease to 2.5 mg (1/2 tab) daily until you return for next appointment  Your physician recommends that you schedule a follow-up appointment in: 2 months

## 2015-04-27 NOTE — Progress Notes (Signed)
  Echocardiogram 2D Echocardiogram has been performed.  Cathie Beams 04/27/2015, 3:09 PM

## 2015-04-27 NOTE — Telephone Encounter (Signed)
Patient made aware of VT episode 04/19/15 requiring ATP. He does not recall symptoms. I advised him to make Dr. Shirlee Latch aware at appt this afternoon- he is agreeable. Made aware of driving restrictions x 6 months. He requests that we not call him for these episodes- he feels as if they are being blown out of proportion due to him not being symptomatic. I told him that we will make note of this and that we are liable to make him aware of driving restrictions, but that we will not call him until August if these driving restrictions are extended by recent episodes. He verbalizes understanding. I will have GT review episode when he is in the office.

## 2015-04-28 NOTE — Progress Notes (Signed)
Patient ID: Christopher Patrick, male   DOB: December 27, 1965, 50 y.o.   MRN: [id number] PCP: Dr. Leonette Most  50 yo with history of pulmonary sarcoidosis was admitted to [facility] in 1/16 after a VT arrest with 20 minutes CPR and defibrillation x 3.  He had therapeutic hypothermia.  He had good neurologic recovery.  He developed VT storm and ended up getting St Jude BiV ICD and starting amiodarone.  LHC showed no significant coronary disease.  Echo initially after arrest showed EF 15-20%, but cardiac MRI a week or so later showed EF 46% with LGE pattern concerning for cardiac sarcoidosis.  Patient also had complete heart block (treated with St Jude BiV ICD).  The cause of VT and CHB was thought to be cardiac sarcoidosis.  He was started on prednisone 60 mg daily and is following with pulmonary (Dr Craige Cotta).    He was readmitted in 2/16 with presyncope. After extensive evaluation, this was thought to be vagal in origin most likely.  No evidence for arrhythmia by device interrogation.  Echo showed EF improved back to 55-60% with mild RV dilation.  CTA chest showed no PE. Ramipril was decreased to 2.5 mg daily.   Patient has had no further syncope and no lightheadedness.  He is doing well, no exertional dypsnea and no chest pain.  He has had several episodes of VT terminated by ATP, but he has not noticed.  He had an episode in 10/16, again in 1/17 and in 2/17.  These were seen at remote monitoring incidentally. I reviewed today's echo with EF 50%, normal RV size and systolic function.     Labs (1/16): K 4.1, creatinine 1.18, LFTs normal, HCT 42.5, plts 91K Labs (2/16): LFTs normal, K 3.8, creatinine 1.19, TSH normal, LDL 129 Labs (11/16): LFTs normal, TSH normal  PMH: 1. Pulmonary sarcoidosis: Diagnosed around 2011.  PFTs (2/16) with minimal abnormality.  2. 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   - Echo (1/16) with EF 15-20%, diffuse hypokinesis, RV moderately dilated with severe  decreased systolic function (initially post-cardiac arrest).  - Cardiac MRI (1/16) with EF 46%, basal to mid anteroseptal and apical inferior hypokinesis, RV moderately dilated with mild to moderately decreased systolic function, RV EF 33%, patchy LGE in a pattern concerning for cardiac sarcoidosis.  - Echo (2/16) with EF 55-60%, mild RV dilation.  - Echo (2/17) with EF 50-55%, mildly 3. Ventricular tachycardia: Admission 1/16 with cardiac arrest and VT storm. Suspect due to cardiac sarcoidosis.  - LHC (1/16) with normal coronaries.  - St Jude BiV ICD placed, amiodarone started.  4. Complete heart block: Likely due to cardiac sarcoidosis.  St Jude BiV ICD placed.  5. GERD 6. Pre-syncope: 2/16, possibly vagal event.   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   Current Outpatient Prescriptions  Medication Sig Dispense Refill  . amiodarone (PACERONE) 200 MG tablet Take one tablet by mouth daily 90 tablet 3  . carvedilol (COREG) 6.25 MG tablet Take 1.5 tablets (9.375 mg total) by mouth 2 (two) times daily with a meal. 180 tablet 3  . predniSONE (DELTASONE) 5 MG tablet Take 1 tablet (5 mg total) by mouth daily with breakfast. 30 tablet 1  . ramipril (ALTACE) 2.5 MG capsule Take 1 capsule (2.5 mg total) by mouth daily. 90 capsule 3   No current facility-administered medications for this  encounter.   BP 124/62 mmHg  Pulse 60  Resp 18  Wt 225 lb 4 oz (102.173 kg)  SpO2 95% General: NAD Neck: No JVD, no thyromegaly or thyroid nodule.  Lungs: Clear to auscultation bilaterally with normal respiratory effort. CV: Nondisplaced PMI.  Heart regular S1/S2, no S3/S4, no murmur.  No peripheral edema.  No carotid bruit.  Normal pedal pulses.  Abdomen: Soft, nontender, no hepatosplenomegaly, no distention.  Skin: Intact without lesions or rashes.  Neurologic: Alert and oriented x 3.   Psych: Normal affect. Extremities: No clubbing or cyanosis.  HEENT: Normal.   Assessment/Plan: 1. Cardiac sarcoidosis: I suspect this is the etiology of cardiomyopathy, VT, and complete heart block.  He is on prednisone 10 mg daily now.  Today's echo shows low normal to mildly impaired systolic function.  Pulmonary sarcoidosis has been quiescent. - I am going to decrease prednisone to 5 mg daily x 1 month and then for 2.5 mg daily x 1 month.  I will meet with him after 2 months and reassess his device.  If he is having more frequent VT or NSVT runs, may need ongoing prednisone.  If not, stop prednisone.  2. Chronic systolic CHF: Initial EF 15-20% in hospital, suspect this was due to stunning from cardiac arrest.  Cardiac MRI a week or so later showed LV EF 46% with moderate RV systolic dysfunction. EF back to 50-55% on most recent echo in 2/17. He looks euvolemic on exam today.  NYHA class I-II symptoms.  - Continue ramipril and will increase Coreg to 9.375 mg bid given VT history.  3. VT: Has ICD, on amiodarone.  Check LFTs and TSH.  Will need at least yearly eye exam while on amiodarone. Continue amiodarone 200 mg daily.   4. Complete heart block: St Jude CRT, to be followed by Dr Ladona Ridgel.   Marca Ancona 04/28/2015

## 2015-05-26 ENCOUNTER — Ambulatory Visit (INDEPENDENT_AMBULATORY_CARE_PROVIDER_SITE_OTHER): Payer: Commercial Managed Care - HMO | Admitting: *Deleted

## 2015-05-26 ENCOUNTER — Encounter: Payer: Self-pay | Admitting: Internal Medicine

## 2015-05-26 DIAGNOSIS — I429 Cardiomyopathy, unspecified: Secondary | ICD-10-CM | POA: Diagnosis not present

## 2015-05-26 DIAGNOSIS — I469 Cardiac arrest, cause unspecified: Secondary | ICD-10-CM

## 2015-05-26 DIAGNOSIS — I428 Other cardiomyopathies: Secondary | ICD-10-CM

## 2015-05-27 NOTE — Progress Notes (Signed)
Remote ICD transmission.   

## 2015-06-01 ENCOUNTER — Other Ambulatory Visit (HOSPITAL_COMMUNITY): Payer: Self-pay | Admitting: Cardiology

## 2015-06-01 DIAGNOSIS — I428 Other cardiomyopathies: Secondary | ICD-10-CM

## 2015-06-01 MED ORDER — CARVEDILOL 6.25 MG PO TABS: 9.3750 mg | ORAL_TABLET | Freq: Two times a day (BID) | ORAL | Status: DC

## 2015-06-25 ENCOUNTER — Encounter (HOSPITAL_COMMUNITY): Payer: Self-pay

## 2015-06-25 ENCOUNTER — Ambulatory Visit (HOSPITAL_COMMUNITY)
Admission: RE | Admit: 2015-06-25 | Discharge: 2015-06-25 | Disposition: A | Discharge status: Home / Self Care | Payer: Commercial Managed Care - HMO | Source: Ambulatory Visit | Attending: Cardiology | Admitting: Cardiology

## 2015-06-25 VITALS — BP 140/88 | HR 77 | Wt 227.8 lb

## 2015-06-25 DIAGNOSIS — I472 Ventricular tachycardia, unspecified: Secondary | ICD-10-CM

## 2015-06-25 DIAGNOSIS — I5022 Chronic systolic (congestive) heart failure: Secondary | ICD-10-CM | POA: Insufficient documentation

## 2015-06-25 DIAGNOSIS — I429 Cardiomyopathy, unspecified: Secondary | ICD-10-CM | POA: Diagnosis not present

## 2015-06-25 DIAGNOSIS — Z79899 Other long term (current) drug therapy: Secondary | ICD-10-CM | POA: Insufficient documentation

## 2015-06-25 DIAGNOSIS — D860 Sarcoidosis of lung: Secondary | ICD-10-CM | POA: Insufficient documentation

## 2015-06-25 DIAGNOSIS — D8685 Sarcoid myocarditis: Secondary | ICD-10-CM | POA: Insufficient documentation

## 2015-06-25 DIAGNOSIS — I428 Other cardiomyopathies: Secondary | ICD-10-CM

## 2015-06-25 DIAGNOSIS — K219 Gastro-esophageal reflux disease without esophagitis: Secondary | ICD-10-CM | POA: Diagnosis not present

## 2015-06-25 DIAGNOSIS — Z8674 Personal history of sudden cardiac arrest: Secondary | ICD-10-CM | POA: Diagnosis not present

## 2015-06-25 DIAGNOSIS — I442 Atrioventricular block, complete: Secondary | ICD-10-CM | POA: Diagnosis not present

## 2015-06-25 DIAGNOSIS — Z7952 Long term (current) use of systemic steroids: Secondary | ICD-10-CM | POA: Diagnosis not present

## 2015-06-25 DIAGNOSIS — Z95810 Presence of automatic (implantable) cardiac defibrillator: Secondary | ICD-10-CM | POA: Insufficient documentation

## 2015-06-25 MED ORDER — CARVEDILOL 12.5 MG PO TABS: 12.5000 mg | ORAL_TABLET | Freq: Two times a day (BID) | ORAL | Status: DC

## 2015-06-25 NOTE — Patient Instructions (Signed)
Increase Carvedilol to 12.5 mg Twice daily   Fasting Labs in 2 months  We will contact you in 6 months to schedule your next appointment.

## 2015-06-25 NOTE — Progress Notes (Signed)
Advanced Heart Failure Medication Review by a Pharmacist  Does the patient  feel that his/her medications are working for him/her?  yes  Has the patient been experiencing any side effects to the medications prescribed?  no  Does the patient measure his/her own blood pressure or blood glucose at home?  yes   Does the patient have any problems obtaining medications due to transportation or finances?   no  Understanding of regimen: good Understanding of indications: good Potential of compliance: excellent Patient understands to avoid NSAIDs. Patient understands to avoid decongestants.  Issues to address at subsequent visits: None   Pharmacist comments:  Christopher Patrick is a pleasant 50 yo M presenting without a medication list but with excellent recall of his regimen. He reports perfect compliance with his regimen and did not have any specific medication-related questions or concerns for me at this time.   Tyler Deis. Bonnye Fava, PharmD, BCPS, CPP Clinical Pharmacist Pager: [phone] Phone: [phone] 06/25/2015 10:37 AM      Time with patient: 10 minutes Preparation and documentation time: 2 minutes Total time: 12 minutes

## 2015-06-27 NOTE — Progress Notes (Signed)
Patient ID: Christopher Patrick, male   DOB: Jun 16, 1965, 51 y.o.   MRN: [id number] PCP: Dr. Leonette Most  50 yo with history of pulmonary sarcoidosis was admitted to [facility] in 1/16 after a VT arrest with 20 minutes CPR and defibrillation x 3.  He had therapeutic hypothermia.  He had good neurologic recovery.  He developed VT storm and ended up getting St Jude BiV ICD and starting amiodarone.  LHC showed no significant coronary disease.  Echo initially after arrest showed EF 15-20%, but cardiac MRI a week or so later showed EF 46% with LGE pattern concerning for cardiac sarcoidosis.  Patient also had complete heart block (treated with St Jude BiV ICD).  The cause of VT and CHB was thought to be cardiac sarcoidosis.  He was started on prednisone 60 mg daily and is following with pulmonary (Dr Craige Cotta).    He was readmitted in 2/16 with presyncope. After extensive evaluation, this was thought to be vagal in origin most likely.  No evidence for arrhythmia by device interrogation.  Echo showed EF improved back to 55-60% with mild RV dilation.  CTA chest showed no PE. Ramipril was decreased to 2.5 mg daily.   Patient has had no further syncope and no lightheadedness.  He is doing well, no exertional dypsnea and no chest pain.  No further VT on ICD interrogation today.  Corevue doe snot suggest volume overload.  Prednisone is down to 1.25 mg daily.   Labs (1/16): K 4.1, creatinine 1.18, LFTs normal, HCT 42.5, plts 91K Labs (2/16): LFTs normal, K 3.8, creatinine 1.19, TSH normal, LDL 129 Labs (11/16): LFTs normal, TSH normal Labs (2/17): LFTs normal, TSH normal, K 4, creatinine 1.21  PMH: 1. Pulmonary sarcoidosis: Diagnosed around 2011.  PFTs (2/16) with minimal abnormality.  2. 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   - Echo (1/16) with EF 15-20%, diffuse hypokinesis, RV moderately dilated with severe decreased systolic function (initially post-cardiac arrest).  - Cardiac MRI (1/16)  with EF 46%, basal to mid anteroseptal and apical inferior hypokinesis, RV moderately dilated with mild to moderately decreased systolic function, RV EF 33%, patchy LGE in a pattern concerning for cardiac sarcoidosis.  - Echo (2/16) with EF 55-60%, mild RV dilation.  - Echo (2/17) with EF 50-55%, mildly 3. Ventricular tachycardia: Admission 1/16 with cardiac arrest and VT storm. Suspect due to cardiac sarcoidosis.  - LHC (1/16) with normal coronaries.  - St Jude BiV ICD placed, amiodarone started.  4. Complete heart block: Likely due to cardiac sarcoidosis.  St Jude BiV ICD placed.  5. GERD 6. Pre-syncope: 2/16, possibly vagal event.   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   Current Outpatient Prescriptions  Medication Sig Dispense Refill  . amiodarone (PACERONE) 200 MG tablet Take one tablet by mouth daily 90 tablet 3  . carvedilol (COREG) 12.5 MG tablet Take 1 tablet (12.5 mg total) by mouth 2 (two) times daily with a meal. 60 tablet 6  . Multiple Vitamins-Minerals (MULTIVITAMIN WITH MINERALS) tablet Take 1 tablet by mouth daily.    . predniSONE (DELTASONE) 5 MG tablet Take 1.25 mg by mouth daily with breakfast.    . ramipril (ALTACE) 2.5 MG capsule Take 1 capsule (2.5 mg total) by mouth daily. 90 capsule 3  . zolpidem (AMBIEN) 10 MG tablet Take 10 mg by mouth at bedtime as  needed for sleep.      No current facility-administered medications for this encounter.   BP 140/88 mmHg  Pulse 77  Wt 227 lb 12 oz (103.307 kg)  SpO2 99% General: NAD Neck: No JVD, no thyromegaly or thyroid nodule.  Lungs: Clear to auscultation bilaterally with normal respiratory effort. CV: Nondisplaced PMI.  Heart regular S1/S2, no S3/S4, no murmur.  No peripheral edema.  No carotid bruit.  Normal pedal pulses.  Abdomen: Soft, nontender, no hepatosplenomegaly, no distention.  Skin:  Intact without lesions or rashes.  Neurologic: Alert and oriented x 3.  Psych: Normal affect. Extremities: No clubbing or cyanosis.  HEENT: Normal.   Assessment/Plan: 1. Cardiac sarcoidosis: I suspect this is the etiology of cardiomyopathy, VT, and complete heart block.  Echo 2/17 with EF 50-55%.  Pulmonary sarcoidosis has been quiescent. - He can stop prednisone today.  2. Chronic systolic CHF: Initial EF 15-20% in hospital, suspect this was due to stunning from cardiac arrest.  Cardiac MRI a week or so later showed LV EF 46% with moderate RV systolic dysfunction. EF back to 50-55% on most recent echo in 2/17. He looks euvolemic on exam today.  NYHA class I-II symptoms.  - Continue ramipril and will increase Coreg to 12.5 mg bid given VT history.  3. VT: Has ICD, on amiodarone.  Check LFTs and TSH.  Will need at least yearly eye exam while on amiodarone. He will need PFTs through pulmonary (follows with Dr. Craige Cotta).  Continue amiodarone 200 mg daily.   4. Complete heart block: St Jude CRT, to be followed by Dr Ladona Ridgel.   Marca Ancona 06/27/2015

## 2015-06-30 ENCOUNTER — Encounter: Payer: Self-pay | Admitting: Internal Medicine

## 2015-07-07 ENCOUNTER — Encounter: Payer: Self-pay | Admitting: Cardiology

## 2015-07-07 LAB — CUP PACEART REMOTE DEVICE CHECK
Brady Statistic AP VS Percent: 1 %
Brady Statistic AS VP Percent: 10 %
Brady Statistic AS VS Percent: 1 %
Brady Statistic RA Percent Paced: 79 %
Date Time Interrogation Session: 20170321060027
HighPow Impedance: 66 Ohm
HighPow Impedance: 66 Ohm
Implantable Lead Implant Date: 20160111
Implantable Lead Implant Date: 20160111
Implantable Lead Location: 753858
Implantable Lead Serial Number: 326242
Lead Channel Impedance Value: 410 Ohm
Lead Channel Impedance Value: 510 Ohm
Lead Channel Impedance Value: 590 Ohm
Lead Channel Pacing Threshold Amplitude: 1.375 V
Lead Channel Pacing Threshold Pulse Width: 0.5 ms
Lead Channel Sensing Intrinsic Amplitude: 5 mV
Lead Channel Setting Pacing Amplitude: 2.375
Lead Channel Setting Pacing Amplitude: 2.5 V
Lead Channel Setting Sensing Sensitivity: 2 mV
MDC IDC LEAD IMPLANT DT: 20160111
MDC IDC LEAD LOCATION: 753859
MDC IDC LEAD LOCATION: 753860
MDC IDC LEAD MODEL: 181
MDC IDC MSMT BATTERY REMAINING LONGEVITY: 65 mo
MDC IDC MSMT BATTERY REMAINING PERCENTAGE: 79 %
MDC IDC MSMT BATTERY VOLTAGE: 2.98 V
MDC IDC MSMT LEADCHNL RV SENSING INTR AMPL: 4.6 mV
MDC IDC SET LEADCHNL LV PACING AMPLITUDE: 2 V
MDC IDC SET LEADCHNL LV PACING PULSEWIDTH: 0.5 ms
MDC IDC SET LEADCHNL RV PACING PULSEWIDTH: 0.5 ms
MDC IDC STAT BRADY AP VP PERCENT: 83 %
Pulse Gen Serial Number: 7210309

## 2015-07-21 ENCOUNTER — Encounter: Payer: Self-pay | Admitting: Cardiology

## 2015-08-25 ENCOUNTER — Ambulatory Visit (INDEPENDENT_AMBULATORY_CARE_PROVIDER_SITE_OTHER): Payer: Commercial Managed Care - HMO | Admitting: *Deleted

## 2015-08-25 DIAGNOSIS — I428 Other cardiomyopathies: Secondary | ICD-10-CM

## 2015-08-25 DIAGNOSIS — I429 Cardiomyopathy, unspecified: Secondary | ICD-10-CM | POA: Diagnosis not present

## 2015-08-25 DIAGNOSIS — Z95810 Presence of automatic (implantable) cardiac defibrillator: Secondary | ICD-10-CM

## 2015-08-25 NOTE — Progress Notes (Signed)
Remote ICD transmission.   

## 2015-08-26 LAB — CUP PACEART REMOTE DEVICE CHECK
Battery Remaining Longevity: 63 mo
Battery Remaining Percentage: 76 %
Battery Voltage: 2.98 V
Brady Statistic AP VP Percent: 85 %
Brady Statistic AP VS Percent: 1 %
Brady Statistic RA Percent Paced: 82 %
HIGH POWER IMPEDANCE MEASURED VALUE: 66 Ohm
HighPow Impedance: 66 Ohm
Implantable Lead Implant Date: 20160111
Implantable Lead Location: 753858
Implantable Lead Location: 753859
Implantable Lead Location: 753860
Implantable Lead Model: 181
Implantable Lead Serial Number: 326242
Lead Channel Impedance Value: 400 Ohm
Lead Channel Impedance Value: 590 Ohm
Lead Channel Impedance Value: 590 Ohm
Lead Channel Pacing Threshold Amplitude: 0.875 V
Lead Channel Pacing Threshold Pulse Width: 0.5 ms
Lead Channel Sensing Intrinsic Amplitude: 11.5 mV
Lead Channel Setting Pacing Amplitude: 2.5 V
Lead Channel Setting Pacing Pulse Width: 0.5 ms
Lead Channel Setting Sensing Sensitivity: 2 mV
MDC IDC LEAD IMPLANT DT: 20160111
MDC IDC LEAD IMPLANT DT: 20160111
MDC IDC MSMT LEADCHNL RA SENSING INTR AMPL: 5 mV
MDC IDC SESS DTM: 20170620060019
MDC IDC SET LEADCHNL LV PACING AMPLITUDE: 2 V
MDC IDC SET LEADCHNL LV PACING PULSEWIDTH: 0.5 ms
MDC IDC SET LEADCHNL RA PACING AMPLITUDE: 1.875
MDC IDC STAT BRADY AS VP PERCENT: 10 %
MDC IDC STAT BRADY AS VS PERCENT: 1 %
Pulse Gen Serial Number: 7210309

## 2015-08-28 ENCOUNTER — Encounter: Payer: Self-pay | Admitting: Cardiology

## 2015-08-31 ENCOUNTER — Ambulatory Visit (HOSPITAL_COMMUNITY)
Admission: RE | Admit: 2015-08-31 | Discharge: 2015-08-31 | Disposition: A | Discharge status: Home / Self Care | Payer: Commercial Managed Care - HMO | Source: Ambulatory Visit | Attending: Cardiology | Admitting: Cardiology

## 2015-08-31 DIAGNOSIS — I472 Ventricular tachycardia, unspecified: Secondary | ICD-10-CM

## 2015-08-31 DIAGNOSIS — I428 Other cardiomyopathies: Secondary | ICD-10-CM

## 2015-08-31 DIAGNOSIS — I429 Cardiomyopathy, unspecified: Secondary | ICD-10-CM | POA: Diagnosis not present

## 2015-08-31 LAB — LIPID PANEL
CHOL/HDL RATIO: 3.4 ratio
CHOLESTEROL: 164 mg/dL (ref 0–200)
HDL: 48 mg/dL (ref >40)
LDL Cholesterol: 101 mg/dL — ABNORMAL HIGH (ref 0–99)
Triglycerides: 75 mg/dL (ref <150)
VLDL: 15 mg/dL (ref 0–40)

## 2015-08-31 LAB — TSH: TSH: 4.547 u[IU]/mL — ABNORMAL HIGH (ref 0.350–4.500)

## 2015-08-31 LAB — COMPREHENSIVE METABOLIC PANEL
ALBUMIN: 3.9 g/dL (ref 3.5–5.0)
ALT: 21 U/L (ref 17–63)
ANION GAP: 4 — AB (ref 5–15)
AST: 25 U/L (ref 15–41)
Alkaline Phosphatase: 66 U/L (ref 38–126)
BILIRUBIN TOTAL: 0.7 mg/dL (ref 0.3–1.2)
BUN: 16 mg/dL (ref 6–20)
CO2: 29 mmol/L (ref 22–32)
Calcium: 9.4 mg/dL (ref 8.9–10.3)
Chloride: 109 mmol/L (ref 101–111)
Creatinine, Ser: 1.39 mg/dL — ABNORMAL HIGH (ref 0.61–1.24)
GFR calc Af Amer: >60 mL/min (ref >60)
GFR calc non Af Amer: 57 mL/min — ABNORMAL LOW (ref >60)
GLUCOSE: 80 mg/dL (ref 65–99)
POTASSIUM: 4.6 mmol/L (ref 3.5–5.1)
Sodium: 142 mmol/L (ref 135–145)
TOTAL PROTEIN: 6.3 g/dL — AB (ref 6.5–8.1)

## 2015-09-11 ENCOUNTER — Telehealth (HOSPITAL_COMMUNITY): Payer: Self-pay | Admitting: Cardiology

## 2015-09-11 ENCOUNTER — Encounter: Payer: Self-pay | Admitting: Cardiology

## 2015-09-11 DIAGNOSIS — I428 Other cardiomyopathies: Secondary | ICD-10-CM

## 2015-09-11 NOTE — Telephone Encounter (Signed)
-----   Message from Laurey Morale, MD sent at 09/06/2015 10:16 PM EDT ----- Good lipids but mildly elevated TSH.  Repeat TSH and draw free T3 and free T4.

## 2015-09-14 ENCOUNTER — Ambulatory Visit (HOSPITAL_COMMUNITY)
Admission: RE | Admit: 2015-09-14 | Discharge: 2015-09-14 | Disposition: A | Discharge status: Home / Self Care | Payer: Commercial Managed Care - HMO | Source: Ambulatory Visit | Attending: Cardiology | Admitting: Cardiology

## 2015-09-14 DIAGNOSIS — I428 Other cardiomyopathies: Secondary | ICD-10-CM

## 2015-09-14 DIAGNOSIS — I429 Cardiomyopathy, unspecified: Secondary | ICD-10-CM | POA: Insufficient documentation

## 2015-09-14 LAB — TSH: TSH: 4.264 u[IU]/mL (ref 0.350–4.500)

## 2015-09-14 LAB — T4, FREE: Free T4: 1.29 ng/dL — ABNORMAL HIGH (ref 0.61–1.12)

## 2015-09-15 LAB — T3, FREE: T3, Free: 2.9 pg/mL (ref 2.0–4.4)

## 2015-10-28 ENCOUNTER — Ambulatory Visit (INDEPENDENT_AMBULATORY_CARE_PROVIDER_SITE_OTHER): Payer: Commercial Managed Care - HMO | Admitting: Pulmonary Disease

## 2015-10-28 ENCOUNTER — Encounter: Payer: Self-pay | Admitting: Pulmonary Disease

## 2015-10-28 VITALS — BP 138/84 | HR 64 | Ht 72.0 in | Wt 228.0 lb

## 2015-10-28 DIAGNOSIS — R918 Other nonspecific abnormal finding of lung field: Secondary | ICD-10-CM | POA: Diagnosis not present

## 2015-10-28 DIAGNOSIS — D860 Sarcoidosis of lung: Secondary | ICD-10-CM

## 2015-10-28 DIAGNOSIS — R05 Cough: Secondary | ICD-10-CM

## 2015-10-28 DIAGNOSIS — R058 Other specified cough: Secondary | ICD-10-CM

## 2015-10-28 NOTE — Patient Instructions (Signed)
Will schedule CT chest and call with results  Follow up in 1 year 

## 2015-10-28 NOTE — Progress Notes (Signed)
Current Outpatient Prescriptions on File Prior to Visit  Medication Sig  . amiodarone (PACERONE) 200 MG tablet Take one tablet by mouth daily  . carvedilol (COREG) 12.5 MG tablet Take 1 tablet (12.5 mg total) by mouth 2 (two) times daily with a meal.  . Multiple Vitamins-Minerals (MULTIVITAMIN WITH MINERALS) tablet Take 1 tablet by mouth daily.  . ramipril (ALTACE) 2.5 MG capsule Take 1 capsule (2.5 mg total) by mouth daily.  Marland Kitchen zolpidem (AMBIEN) 10 MG tablet Take 10 mg by mouth at bedtime as needed for sleep.    No current facility-administered medications on file prior to visit.      Chief Complaint  Patient presents with  . Follow-up    Pt denies any increased breathing issues. Pt reports having some increased cough and congestion x 1 week ago - allergy meds seem to help to reduce symptoms.      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  Vital Signs BP 138/84 (BP Location: Left Arm, Cuff Size: Normal)   Pulse 64   Ht 6' (1.829 m)   Wt 228 lb (103.4 kg)   SpO2 98%   BMI 30.92 kg/m   History of Present Illness Christopher Patrick is a 50 y.o. male with pulmonary and cardiac sarcoidosis.  He has been off prednisone for several months.  Was doing well until this past week.  Has sinus congestion, post nasal drip, and cough.  Has been taking OTC allergy medications and feels better.  Denies fever, chest pain, wheeze, hemoptysis, dyspnea, skin rash or leg swelling.  Physical Exam  General - No distress ENT - No  sinus tenderness, no oral exudate, no LAN Cardiac - s1s2 regular, no murmur Chest - No wheeze/rales/dullness Back - No focal tenderness Abd - Soft, non-tender Ext - No edema Neuro - Normal strength Skin - No rashes Psych - normal mood, and behavior   Assessment/Plan  Upper airway cough syndrome. - continue symptomatic therapy  Pulmonary sarcoidosis. Lung nodules. - repeat CT chest w/o contrast  Cardiac sarcoidosis. - f/u with cardiology   Patient Instructions  Will schedule CT chest and call with results  Follow up in 1 year    Coralyn Helling, MD [facility] Pager:  [phone] 10/28/2015, 9:49 AM

## 2015-10-30 IMAGING — CT CT RENAL STONE PROTOCOL
2 of 4 series · 16 of 46 positions shown, 18 images · non-contrast
Comparison: 07/11/2008, chest CT 11/29/2011

CLINICAL DATA: Left-sided flank pain for 12 hours, dysuria

EXAM:
CT ABDOMEN AND PELVIS WITHOUT CONTRAST
TECHNIQUE: Multidetector CT imaging of the abdomen and pelvis was performed
following the standard protocol without IV contrast.

[Series 2: renal stone > 200 lbs 5.0 b31f · axial · 0.78mm/px · z∈[-495,-85]mm · 13 of 90 slices shown, 15 images]
[im 4/90  soft-tissue]
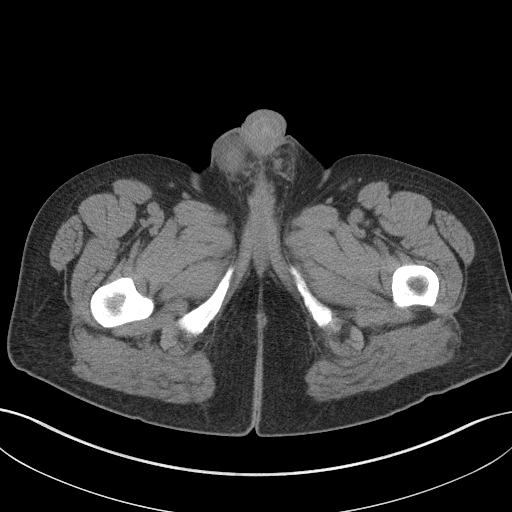
[im 4/90  bone]
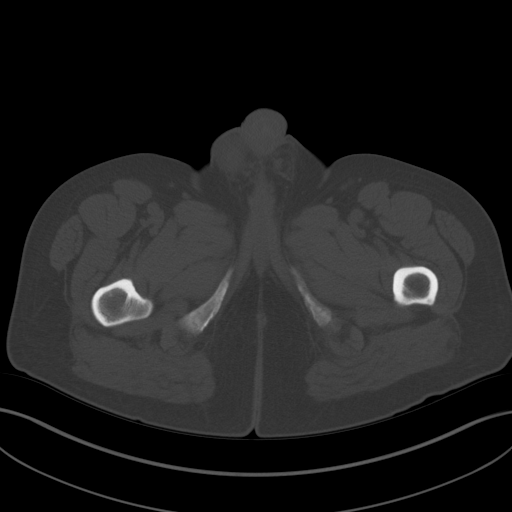
[im 12/90  soft-tissue]
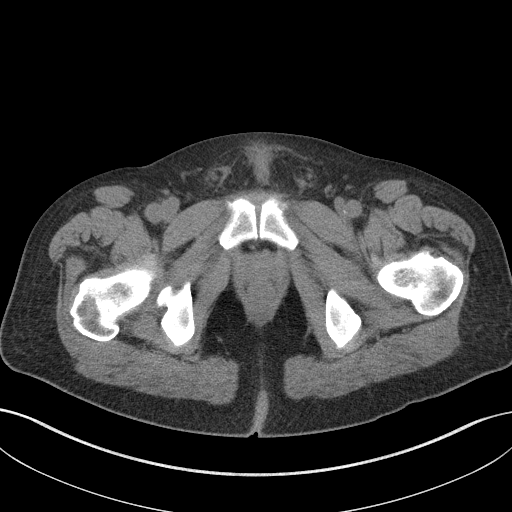
[im 19/90  soft-tissue]
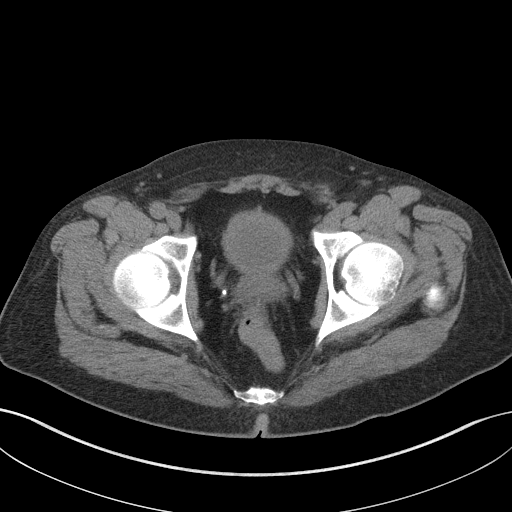
[im 26/90  soft-tissue]
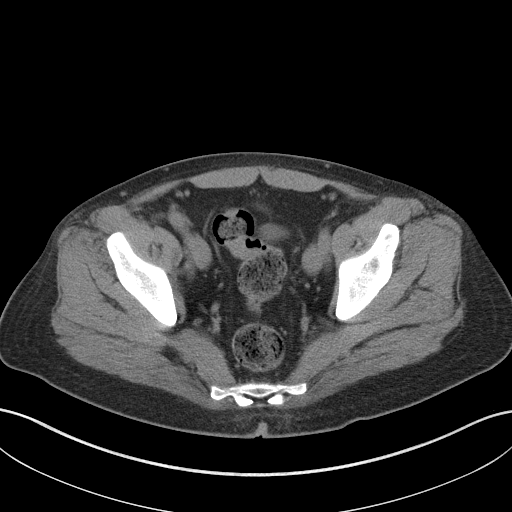
[im 30/90  soft-tissue]
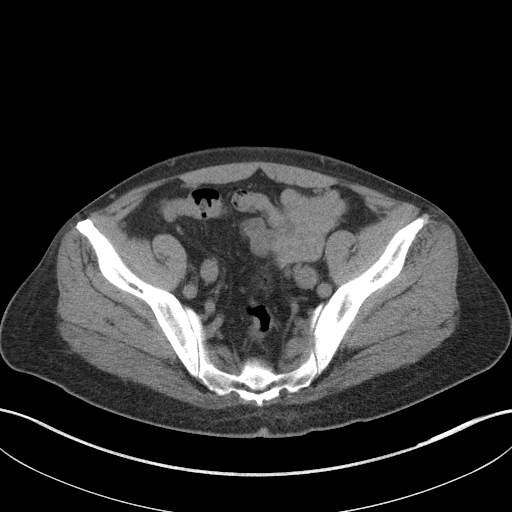
[im 38/90  soft-tissue]
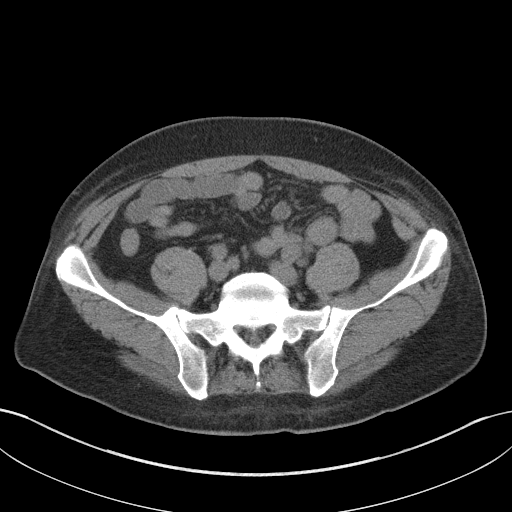
[im 45/90  soft-tissue]
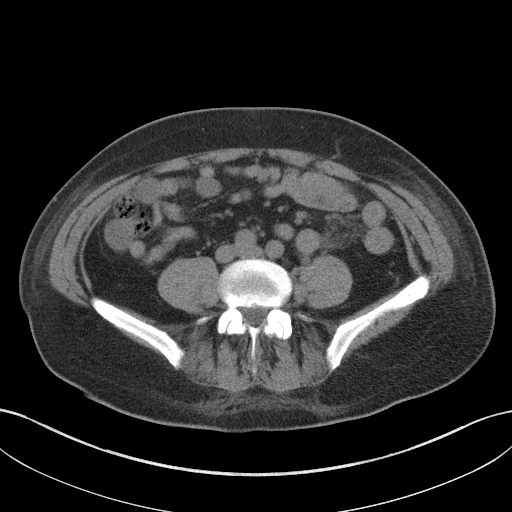
[im 52/90  soft-tissue]
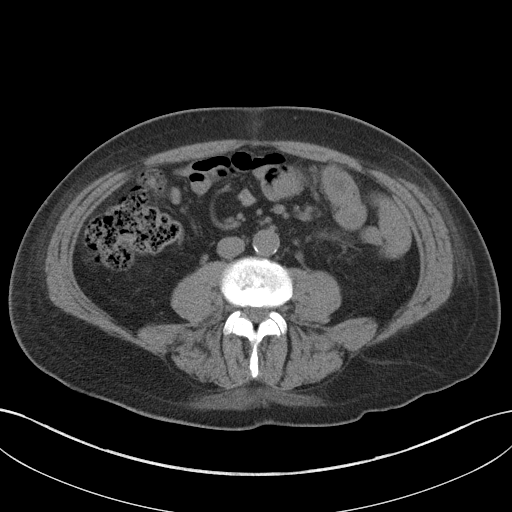
[im 60/90  soft-tissue]
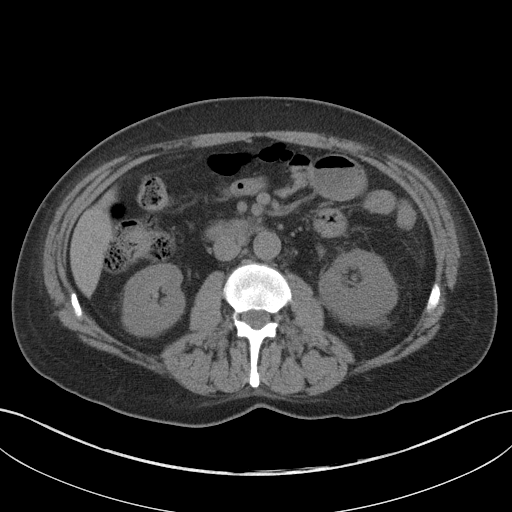
[im 60/90  bone]
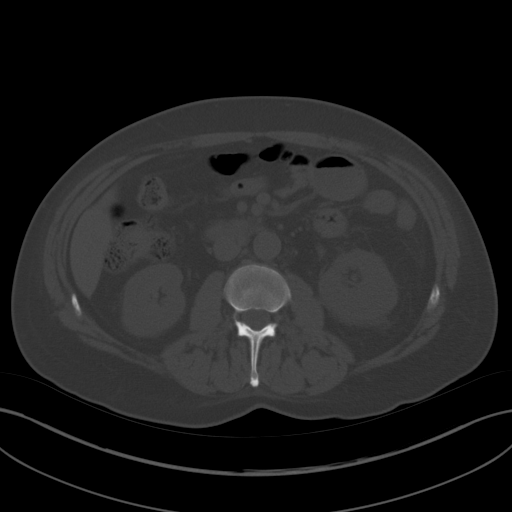
[im 64/90  soft-tissue]
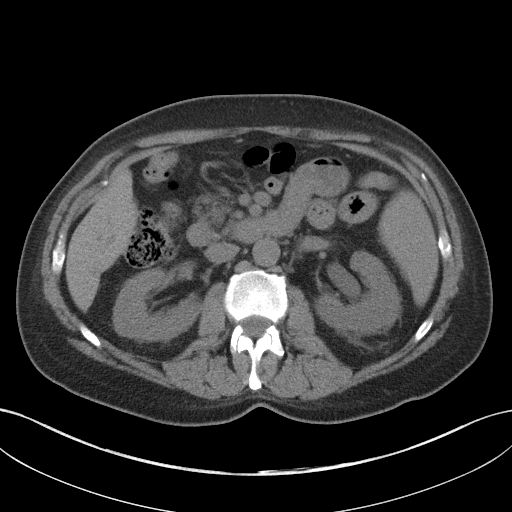
[im 71/90  soft-tissue]
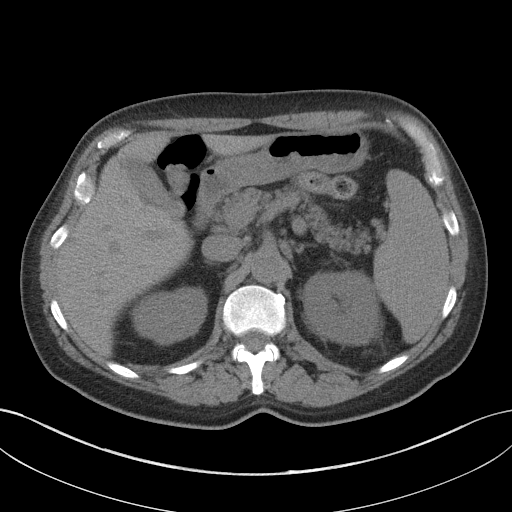
[im 78/90  soft-tissue]
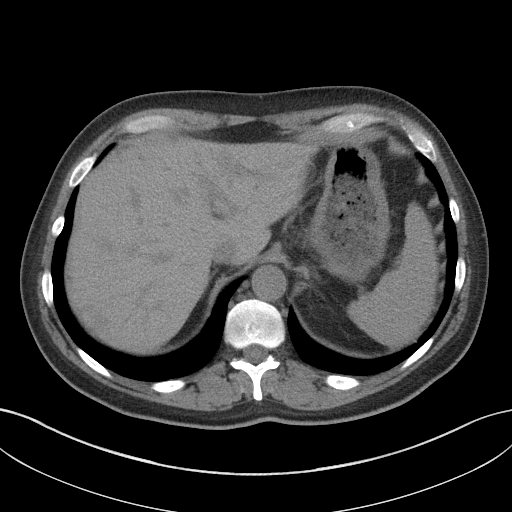
[im 86/90  soft-tissue]
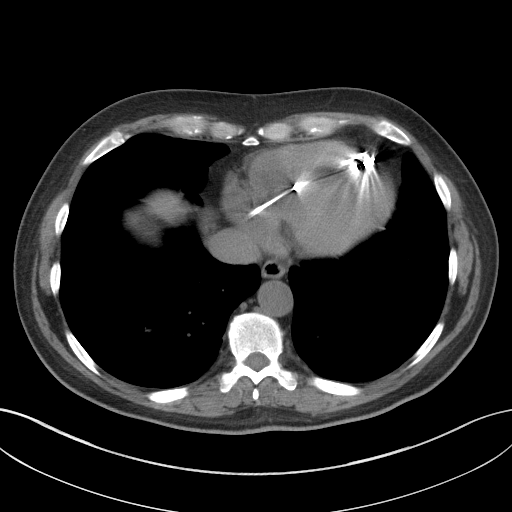

[Series 5: renal stone 3.0 coronal · coronal · 0.75mm/px · 3 of 85 slices shown]
[im 29/85  soft-tissue]
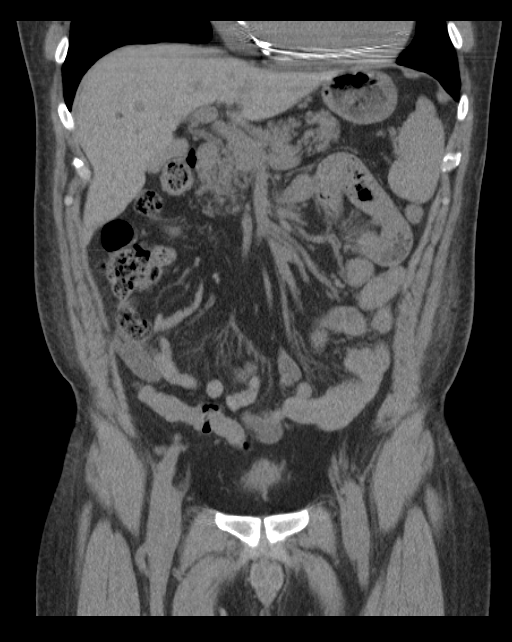
[im 38/85  soft-tissue]
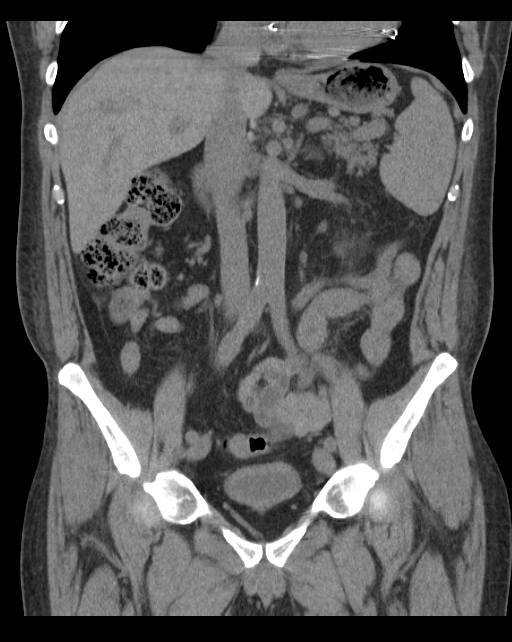
[im 47/85  soft-tissue]
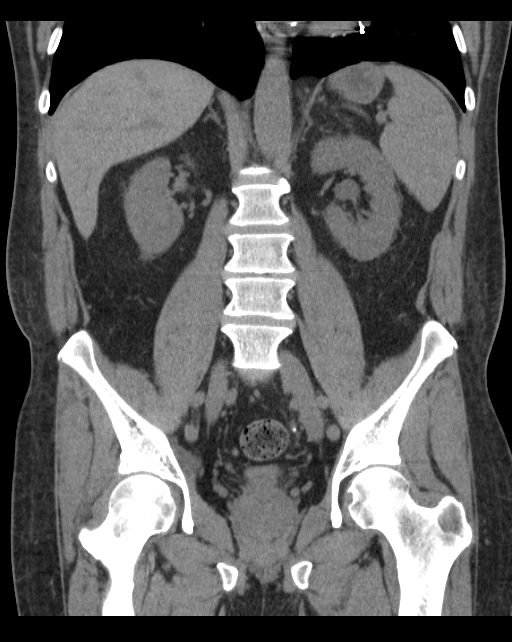

[16 of 46 positions shown; findings below may reference images not displayed]

FINDINGS: Lower chest: Cardiac pacer leads partly visualized. Heart size upper
limits of normal. Trace pericardial fluid is present. Left lower
lobe 4 mm nodule is reidentified, not significantly changed from
11/29/2011 exam, compatible with a benign process.

Hepatobiliary: The liver and decompressed gallbladder are
unremarkable.

Pancreas: Normal

Spleen: Normal

Adrenals/Urinary Tract: Adrenal glands and right kidney appear
normal. There is moderate left hydroureteronephrosis to the level of
a 5 mm distal left ureteral calculus image 63. Mild left perinephric
stranding/ fluid is identified. The bladder appears normal. No other
radiopaque renal or right ureteral calculus is identified.

Stomach/Bowel: Normal

Vascular/Lymphatic: Mild atheromatous aortic calcification without
aneurysm. No lymphadenopathy.

Reproductive: Normal

Other: No free fluid or free air.

Musculoskeletal: No acute osseous abnormality.
IMPRESSION: 5 mm distal left ureteral calculus producing moderate proximal left
hydroureteronephrosis

## 2015-11-02 ENCOUNTER — Ambulatory Visit (INDEPENDENT_AMBULATORY_CARE_PROVIDER_SITE_OTHER)
Admission: RE | Admit: 2015-11-02 | Discharge: 2015-11-02 | Disposition: A | Discharge status: Home / Self Care | Payer: Commercial Managed Care - HMO | Source: Ambulatory Visit | Attending: Pulmonary Disease | Admitting: Pulmonary Disease

## 2015-11-02 DIAGNOSIS — D860 Sarcoidosis of lung: Secondary | ICD-10-CM

## 2015-11-02 DIAGNOSIS — R918 Other nonspecific abnormal finding of lung field: Secondary | ICD-10-CM

## 2015-11-04 ENCOUNTER — Telehealth: Payer: Self-pay | Admitting: Pulmonary Disease

## 2015-11-04 NOTE — Telephone Encounter (Signed)
CT chest 11/02/15 >> b/l mediastinal and hilar calcified nodes, Lt apical scarring, decreased number/size of lung nodules, b/l gynecomastia   Will have my nurse inform pt that CT chest looks better.  No evidence for progression of sarcoidosis in his lungs.

## 2015-11-06 NOTE — Telephone Encounter (Signed)
LM x 1 

## 2015-11-10 NOTE — Telephone Encounter (Signed)
Patient returning our call - pr  °

## 2015-11-10 NOTE — Telephone Encounter (Signed)
Spoke with pt, aware of results/recs.  Nothing further needed.  

## 2015-11-24 ENCOUNTER — Telehealth: Payer: Self-pay | Admitting: Cardiology

## 2015-11-24 ENCOUNTER — Ambulatory Visit (INDEPENDENT_AMBULATORY_CARE_PROVIDER_SITE_OTHER): Payer: Commercial Managed Care - HMO | Admitting: *Deleted

## 2015-11-24 DIAGNOSIS — I429 Cardiomyopathy, unspecified: Secondary | ICD-10-CM | POA: Diagnosis not present

## 2015-11-24 DIAGNOSIS — I428 Other cardiomyopathies: Secondary | ICD-10-CM

## 2015-11-24 NOTE — Telephone Encounter (Signed)
Spoke with pt and reminded pt of remote transmission that is due today. Pt verbalized understanding.   

## 2015-11-25 ENCOUNTER — Encounter: Payer: Self-pay | Admitting: Cardiology

## 2015-11-25 NOTE — Progress Notes (Signed)
Remote ICD transmission.   

## 2015-12-07 ENCOUNTER — Encounter: Payer: Self-pay | Admitting: Cardiology

## 2015-12-18 LAB — CUP PACEART REMOTE DEVICE CHECK
Battery Remaining Longevity: 61 mo
Brady Statistic AP VS Percent: 1 %
Brady Statistic AS VS Percent: 1 %
Date Time Interrogation Session: 20170919201923
HIGH POWER IMPEDANCE MEASURED VALUE: 68 Ohm
HighPow Impedance: 68 Ohm
Implantable Lead Implant Date: 20160111
Implantable Lead Implant Date: 20160111
Implantable Lead Location: 753859
Implantable Lead Location: 753860
Lead Channel Impedance Value: 410 Ohm
Lead Channel Impedance Value: 590 Ohm
Lead Channel Pacing Threshold Pulse Width: 0.5 ms
Lead Channel Sensing Intrinsic Amplitude: 11.4 mV
Lead Channel Sensing Intrinsic Amplitude: 5 mV
Lead Channel Setting Pacing Amplitude: 2 V
Lead Channel Setting Pacing Pulse Width: 0.5 ms
MDC IDC LEAD IMPLANT DT: 20160111
MDC IDC LEAD LOCATION: 753858
MDC IDC LEAD MODEL: 181
MDC IDC LEAD SERIAL: 326242
MDC IDC MSMT BATTERY REMAINING PERCENTAGE: 73 %
MDC IDC MSMT BATTERY VOLTAGE: 2.96 V
MDC IDC MSMT LEADCHNL LV PACING THRESHOLD AMPLITUDE: 1 V
MDC IDC MSMT LEADCHNL LV PACING THRESHOLD PULSEWIDTH: 0.5 ms
MDC IDC MSMT LEADCHNL RA PACING THRESHOLD AMPLITUDE: 1.125 V
MDC IDC MSMT LEADCHNL RV IMPEDANCE VALUE: 580 Ohm
MDC IDC MSMT LEADCHNL RV PACING THRESHOLD AMPLITUDE: 0.75 V
MDC IDC MSMT LEADCHNL RV PACING THRESHOLD PULSEWIDTH: 0.5 ms
MDC IDC PG SERIAL: 7210309
MDC IDC SET LEADCHNL LV PACING PULSEWIDTH: 0.5 ms
MDC IDC SET LEADCHNL RA PACING AMPLITUDE: 2.125
MDC IDC SET LEADCHNL RV PACING AMPLITUDE: 2.5 V
MDC IDC SET LEADCHNL RV SENSING SENSITIVITY: 2 mV
MDC IDC STAT BRADY AP VP PERCENT: 87 %
MDC IDC STAT BRADY AS VP PERCENT: 8.6 %
MDC IDC STAT BRADY RA PERCENT PACED: 83 %

## 2016-02-07 ENCOUNTER — Other Ambulatory Visit (HOSPITAL_COMMUNITY): Payer: Self-pay | Admitting: Cardiology

## 2016-02-07 DIAGNOSIS — I428 Other cardiomyopathies: Secondary | ICD-10-CM

## 2016-02-18 ENCOUNTER — Encounter: Payer: Self-pay | Admitting: Internal Medicine

## 2016-03-03 ENCOUNTER — Encounter: Payer: Self-pay | Admitting: Internal Medicine

## 2016-03-03 ENCOUNTER — Ambulatory Visit (INDEPENDENT_AMBULATORY_CARE_PROVIDER_SITE_OTHER): Payer: Commercial Managed Care - HMO | Admitting: Internal Medicine

## 2016-03-03 VITALS — BP 130/86 | HR 64 | Ht 72.0 in | Wt 233.0 lb

## 2016-03-03 DIAGNOSIS — I472 Ventricular tachycardia, unspecified: Secondary | ICD-10-CM

## 2016-03-03 DIAGNOSIS — I428 Other cardiomyopathies: Secondary | ICD-10-CM | POA: Diagnosis not present

## 2016-03-03 DIAGNOSIS — D8685 Sarcoid myocarditis: Secondary | ICD-10-CM | POA: Diagnosis not present

## 2016-03-03 DIAGNOSIS — Z95810 Presence of automatic (implantable) cardiac defibrillator: Secondary | ICD-10-CM

## 2016-03-03 LAB — CUP PACEART INCLINIC DEVICE CHECK
Brady Statistic RA Percent Paced: 85 %
Brady Statistic RV Percent Paced: 96 %
HIGH POWER IMPEDANCE MEASURED VALUE: 69 Ohm
Implantable Lead Implant Date: 20160111
Implantable Lead Implant Date: 20160111
Implantable Lead Location: 753858
Implantable Lead Location: 753859
Implantable Lead Serial Number: 326242
Implantable Pulse Generator Implant Date: 20160111
Lead Channel Impedance Value: 560 Ohm
Lead Channel Impedance Value: 590 Ohm
Lead Channel Pacing Threshold Amplitude: 1 V
Lead Channel Pacing Threshold Pulse Width: 0.5 ms
Lead Channel Pacing Threshold Pulse Width: 0.5 ms
Lead Channel Sensing Intrinsic Amplitude: 11.4 mV
Lead Channel Setting Pacing Amplitude: 2.5 V
Lead Channel Setting Pacing Pulse Width: 0.5 ms
Lead Channel Setting Pacing Pulse Width: 0.5 ms
MDC IDC LEAD IMPLANT DT: 20160111
MDC IDC LEAD LOCATION: 753860
MDC IDC LEAD MODEL: 181
MDC IDC MSMT LEADCHNL LV PACING THRESHOLD AMPLITUDE: 1.5 V
MDC IDC MSMT LEADCHNL LV PACING THRESHOLD PULSEWIDTH: 0.5 ms
MDC IDC MSMT LEADCHNL RA IMPEDANCE VALUE: 430 Ohm
MDC IDC MSMT LEADCHNL RA PACING THRESHOLD AMPLITUDE: 1.125 V
MDC IDC MSMT LEADCHNL RA SENSING INTR AMPL: 5 mV
MDC IDC PG SERIAL: 7210309
MDC IDC SESS DTM: 20171228132928
MDC IDC SET LEADCHNL LV PACING AMPLITUDE: 2.5 V
MDC IDC SET LEADCHNL RV SENSING SENSITIVITY: 2 mV

## 2016-03-03 MED ORDER — AMIODARONE HCL 200 MG PO TABS: ORAL_TABLET | ORAL | 3 refills | Status: DC

## 2016-03-03 MED ORDER — RAMIPRIL 2.5 MG PO CAPS: 2.5000 mg | ORAL_CAPSULE | Freq: Every day | ORAL | 3 refills | Status: DC

## 2016-03-03 NOTE — Patient Instructions (Addendum)
Medication Instructions:  Your physician recommends that you continue on your current medications as directed. Please refer to the Current Medication list given to you today.   Labwork: None Ordered   Testing/Procedures: None Ordered   Follow-Up: Your physician wants you to follow-up in: 1 year with Dr. Taylor.  You will receive a reminder letter in the mail two months in advance. If you don't receive a letter, please call our office to schedule the follow-up appointment.  Remote monitoring is used to monitor your ICD from home. This monitoring reduces the number of office visits required to check your device to one time per year. It allows us to keep an eye on the functioning of your device to ensure it is working properly. You are scheduled for a device check from home on 06/02/16. You may send your transmission at any time that day. If you have a wireless device, the transmission will be sent automatically. After your physician reviews your transmission, you will receive a postcard with your next transmission date.    Any Other Special Instructions Will Be Listed Below (If Applicable).     If you need a refill on your cardiac medications before your next appointment, please call your pharmacy.   

## 2016-03-03 NOTE — Progress Notes (Signed)
HPI Christopher Patrick returns today for ongoing followup of his ICD. 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 He was initially found to have complete heart block and his VT was incessant and associated with severe LV dysfunction. He has done well in the interim. He is back to work. No device therapies. Allergies  Allergen Reactions  . Benadryl [Diphenhydramine Hcl (Sleep)] Anaphylaxis  . Diphenhydramine Swelling     Current Outpatient Prescriptions  Medication Sig Dispense Refill  . amiodarone (PACERONE) 200 MG tablet Take one tablet by mouth daily 90 tablet 3  . carvedilol (COREG) 12.5 MG tablet TAKE 1 TABLET BY MOUTH 2 TIMES DAILY WITH A MEAL. 60 tablet 5  . Multiple Vitamins-Minerals (MULTIVITAMIN WITH MINERALS) tablet Take 1 tablet by mouth daily.    . ramipril (ALTACE) 2.5 MG capsule Take 1 capsule (2.5 mg total) by mouth daily. 90 capsule 3  . zolpidem (AMBIEN) 10 MG tablet Take 10 mg by mouth at bedtime as needed for sleep.      No current facility-administered medications for this visit.      Past Medical History:  Diagnosis Date  . Acute renal insufficiency    a. During adm 03/2014 for VT arrest.  . Cardiac arrest (HCC)    a. VT arrest with recurrent VT 03/2014, s/p CPR, multiple defib. Ultimately felt 2/2 cardiac sarcoid. Placed on amiodarone. Hospitalization complicated by circulatory shock, VDRF, encephalopathy, AKI, and hiccups which improved by discharge.  . Cardiac sarcoidosis (HCC)   . Complete heart block (HCC)    a. s/p St Jude BiV ICD/pacemaker placement w/ BS leads 03/17/14  . GERD (gastroesophageal reflux disease)   . Non-ischemic cardiomyopathy (HCC)    a. EF15-20% by echo 03/12/14 - s/p BiV-ICD placement. Normal cors 03/2014.  . Pulmonary sarcoidosis (HCC)   . RBBB   . Recurrent ventricular tachycardia (HCC)     ROS:   All systems reviewed and negative except as noted in the HPI.   Past Surgical  History:  Procedure Laterality Date  . BI-VENTRICULAR IMPLANTABLE CARDIOVERTER DEFIBRILLATOR N/A 03/17/2014   Procedure: BI-VENTRICULAR IMPLANTABLE CARDIOVERTER DEFIBRILLATOR  (CRT-D);  Surgeon: Marinus Maw, MD;  Location: [facility] CATH LAB;  Service: Cardiovascular;  Laterality: N/A;  . LEFT HEART CATHETERIZATION WITH CORONARY ANGIOGRAM N/A 03/12/2014   Procedure: LEFT HEART CATHETERIZATION WITH CORONARY ANGIOGRAM;  Surgeon: Iran Ouch, MD;  Location: [facility] CATH LAB;  Service: Cardiovascular;  Laterality: N/A;     Family History  Problem Relation Age of Onset  . Stroke Father   . Hypertension Maternal Grandfather   . Hypertension Maternal Grandmother      Social History   Social History  . Marital status: Married    Spouse name: N/A  . Number of children: N/A  . Years of education: N/A   Occupational History  . Emergency planning/management officer    Social History Main Topics  . Smoking status: Passive Smoke Exposure - Never Smoker  . Smokeless tobacco: Never Used     Comment: second hand as child  . Alcohol use No  . Drug use: No  . Sexual activity: Not on file   Other Topics Concern  . Not on file   Social History Narrative  . No narrative on file     BP 130/86   Pulse 64   Ht 6' (1.829 m)   Wt 233 lb (105.7 kg)   SpO2 98%  BMI 31.60 kg/m   Physical Exam:  Well appearing 50 yo man, NAD HEENT: Unremarkable Neck:  6 cm JVD, no thyromegally Back:  No CVA tenderness Lungs:  Clear with scattered rales, no wheezes or rhonchi HEART:  Regular rate rhythm, no murmurs, no rubs, no clicks Abd:  soft, positive bowel sounds, no organomegally, no rebound, no guarding Ext:  2 plus pulses, no edema, no cyanosis, no clubbing Skin:  No rashes no nodules Neuro:  CN II through XII intact, motor grossly intact  DEVICE  Normal device function.  See PaceArt for details.   Assess/Plan: 1. Chronic systolic heart failure - his sarcoid appears to be quiet and he is class 1. No change in  meds. 2. VT - he has had none. He will continue amiodarone for now. 3. ICD - his St. Jude device is working normally. Will follow. 4. Heart block - he had been biv pacing. He is now conducting. Will reprogram VVI 40 and see how he does in an attempt to prolong the battery on his device. He is instructed to call if he starts to feel bad.  Leonia ReevesGregg Karmon Andis,M.D.

## 2016-03-08 ENCOUNTER — Ambulatory Visit (INDEPENDENT_AMBULATORY_CARE_PROVIDER_SITE_OTHER): Payer: Commercial Managed Care - HMO | Admitting: *Deleted

## 2016-03-08 ENCOUNTER — Telehealth: Payer: Self-pay | Admitting: Internal Medicine

## 2016-03-08 ENCOUNTER — Other Ambulatory Visit: Payer: Self-pay | Admitting: Internal Medicine

## 2016-03-08 DIAGNOSIS — I428 Other cardiomyopathies: Secondary | ICD-10-CM

## 2016-03-08 DIAGNOSIS — Z95810 Presence of automatic (implantable) cardiac defibrillator: Secondary | ICD-10-CM

## 2016-03-08 NOTE — Telephone Encounter (Signed)
Spoke with patient who described "fluttering" sensations that occurred intermittently but primarily at rest/ when laying down. Patient states that these sensations are not new for him but the last occurrence was prior to his device implant. He wonders if some recent changes made to his device on 12/28 may be contributing. He is closer to the office than his home monitor. Will schedule patient for DC appt today.

## 2016-03-08 NOTE — Telephone Encounter (Signed)
New message      Pt recently had his device adjusted.  He states that he does not feel good and want to be seen today.  Please call

## 2016-03-08 NOTE — Progress Notes (Signed)
Device check add on d/t patient c/o fluttering sensations that are intermittent, though prominent with rest, and produce a cough. Upon interrogation of device presenting rhythm showed intermittent PVCs. I explained that the previous changes we had made were to increase conduction and because his pacemaker was no longer "over-ridding" his intrinsic rhythm that he may notice these PVCs. I recommended that we not make any changes and explained that I would review this with Dr. Ladona Ridgel and call him back if he chose to make any changes. Patient verbalized understanding and was appreciative.

## 2016-06-02 ENCOUNTER — Ambulatory Visit (INDEPENDENT_AMBULATORY_CARE_PROVIDER_SITE_OTHER): Payer: Commercial Managed Care - HMO | Admitting: *Deleted

## 2016-06-02 ENCOUNTER — Telehealth: Payer: Self-pay | Admitting: Cardiology

## 2016-06-02 DIAGNOSIS — I428 Other cardiomyopathies: Secondary | ICD-10-CM

## 2016-06-02 NOTE — Telephone Encounter (Signed)
Spoke with pt and reminded pt of remote transmission that is due today. Pt verbalized understanding.   

## 2016-06-03 ENCOUNTER — Encounter: Payer: Self-pay | Admitting: Cardiology

## 2016-06-03 LAB — CUP PACEART REMOTE DEVICE CHECK
Battery Remaining Percentage: 67 %
HighPow Impedance: 69 Ohm
HighPow Impedance: 69 Ohm
Implantable Lead Implant Date: 20160111
Implantable Lead Location: 753859
Implantable Lead Location: 753860
Implantable Lead Model: 181
Implantable Pulse Generator Implant Date: 20160111
Lead Channel Impedance Value: 400 Ohm
Lead Channel Impedance Value: 510 Ohm
Lead Channel Impedance Value: 550 Ohm
Lead Channel Pacing Threshold Amplitude: 1.5 V
Lead Channel Sensing Intrinsic Amplitude: 11.4 mV
Lead Channel Sensing Intrinsic Amplitude: 4.2 mV
Lead Channel Setting Pacing Amplitude: 2.5 V
Lead Channel Setting Pacing Pulse Width: 0.5 ms
MDC IDC LEAD IMPLANT DT: 20160111
MDC IDC LEAD IMPLANT DT: 20160111
MDC IDC LEAD LOCATION: 753858
MDC IDC LEAD SERIAL: 326242
MDC IDC MSMT BATTERY REMAINING LONGEVITY: 74 mo
MDC IDC MSMT BATTERY VOLTAGE: 2.98 V
MDC IDC MSMT LEADCHNL LV PACING THRESHOLD PULSEWIDTH: 0.5 ms
MDC IDC MSMT LEADCHNL RV PACING THRESHOLD AMPLITUDE: 1 V
MDC IDC MSMT LEADCHNL RV PACING THRESHOLD PULSEWIDTH: 0.5 ms
MDC IDC SESS DTM: 20180329210336
MDC IDC SET LEADCHNL RV PACING AMPLITUDE: 2.5 V
MDC IDC SET LEADCHNL RV PACING PULSEWIDTH: 0.5 ms
MDC IDC SET LEADCHNL RV SENSING SENSITIVITY: 2 mV
Pulse Gen Serial Number: 7210309

## 2016-06-03 NOTE — Progress Notes (Signed)
Remote ICD transmission.   

## 2016-06-17 ENCOUNTER — Encounter: Payer: Self-pay | Admitting: Cardiology

## 2016-08-13 ENCOUNTER — Other Ambulatory Visit: Payer: Self-pay | Admitting: Cardiology

## 2016-08-13 DIAGNOSIS — I428 Other cardiomyopathies: Secondary | ICD-10-CM

## 2016-09-01 ENCOUNTER — Ambulatory Visit (INDEPENDENT_AMBULATORY_CARE_PROVIDER_SITE_OTHER): Payer: 59 | Admitting: *Deleted

## 2016-09-01 DIAGNOSIS — I428 Other cardiomyopathies: Secondary | ICD-10-CM

## 2016-09-01 LAB — CUP PACEART REMOTE DEVICE CHECK
Battery Remaining Percentage: 65 %
HighPow Impedance: 65 Ohm
HighPow Impedance: 65 Ohm
Implantable Lead Implant Date: 20160111
Implantable Lead Implant Date: 20160111
Implantable Lead Location: 753858
Implantable Lead Location: 753859
Implantable Lead Model: 181
Implantable Lead Serial Number: 326242
Implantable Pulse Generator Implant Date: 20160111
Lead Channel Impedance Value: 400 Ohm
Lead Channel Impedance Value: 550 Ohm
Lead Channel Pacing Threshold Amplitude: 1.5 V
Lead Channel Pacing Threshold Pulse Width: 0.5 ms
Lead Channel Sensing Intrinsic Amplitude: 4.6 mV
Lead Channel Setting Pacing Amplitude: 2.5 V
Lead Channel Setting Pacing Amplitude: 2.5 V
Lead Channel Setting Pacing Pulse Width: 0.5 ms
MDC IDC LEAD IMPLANT DT: 20160111
MDC IDC LEAD LOCATION: 753860
MDC IDC MSMT BATTERY REMAINING LONGEVITY: 71 mo
MDC IDC MSMT BATTERY VOLTAGE: 2.98 V
MDC IDC MSMT LEADCHNL LV IMPEDANCE VALUE: 510 Ohm
MDC IDC MSMT LEADCHNL LV PACING THRESHOLD PULSEWIDTH: 0.5 ms
MDC IDC MSMT LEADCHNL RV PACING THRESHOLD AMPLITUDE: 1 V
MDC IDC MSMT LEADCHNL RV SENSING INTR AMPL: 11.4 mV
MDC IDC PG SERIAL: 7210309
MDC IDC SESS DTM: 20180628060018
MDC IDC SET LEADCHNL LV PACING PULSEWIDTH: 0.5 ms
MDC IDC SET LEADCHNL RV SENSING SENSITIVITY: 2 mV

## 2016-09-01 NOTE — Progress Notes (Signed)
Remote ICD transmission.   

## 2016-09-02 ENCOUNTER — Encounter: Payer: Self-pay | Admitting: Cardiology

## 2016-09-12 ENCOUNTER — Other Ambulatory Visit: Payer: Self-pay | Admitting: Cardiology

## 2016-09-12 DIAGNOSIS — I428 Other cardiomyopathies: Secondary | ICD-10-CM

## 2016-09-13 ENCOUNTER — Other Ambulatory Visit: Payer: Self-pay | Admitting: Cardiology

## 2016-09-13 DIAGNOSIS — I428 Other cardiomyopathies: Secondary | ICD-10-CM

## 2016-09-16 ENCOUNTER — Encounter: Payer: Self-pay | Admitting: Cardiology

## 2016-09-16 ENCOUNTER — Other Ambulatory Visit (HOSPITAL_COMMUNITY): Payer: Self-pay | Admitting: *Deleted

## 2016-09-16 DIAGNOSIS — I428 Other cardiomyopathies: Secondary | ICD-10-CM

## 2016-09-16 MED ORDER — CARVEDILOL 12.5 MG PO TABS: 12.5000 mg | ORAL_TABLET | Freq: Two times a day (BID) | ORAL | 2 refills | Status: DC

## 2016-09-19 ENCOUNTER — Other Ambulatory Visit (HOSPITAL_COMMUNITY): Payer: Self-pay | Admitting: *Deleted

## 2016-11-04 ENCOUNTER — Ambulatory Visit (HOSPITAL_COMMUNITY)
Admission: RE | Admit: 2016-11-04 | Discharge: 2016-11-04 | Disposition: A | Discharge status: Home / Self Care | Payer: 59 | Source: Ambulatory Visit | Attending: Cardiology | Admitting: Cardiology

## 2016-11-04 ENCOUNTER — Encounter (HOSPITAL_COMMUNITY): Payer: Self-pay | Admitting: Cardiology

## 2016-11-04 ENCOUNTER — Telehealth: Payer: Self-pay | Admitting: Internal Medicine

## 2016-11-04 VITALS — BP 133/80 | HR 56 | Wt 211.5 lb

## 2016-11-04 DIAGNOSIS — I442 Atrioventricular block, complete: Secondary | ICD-10-CM | POA: Diagnosis not present

## 2016-11-04 DIAGNOSIS — D8685 Sarcoid myocarditis: Secondary | ICD-10-CM | POA: Diagnosis present

## 2016-11-04 DIAGNOSIS — I472 Ventricular tachycardia, unspecified: Secondary | ICD-10-CM

## 2016-11-04 DIAGNOSIS — I428 Other cardiomyopathies: Secondary | ICD-10-CM | POA: Insufficient documentation

## 2016-11-04 LAB — CBC
HEMATOCRIT: 44.9 % (ref 39.0–52.0)
HEMOGLOBIN: 15.1 g/dL (ref 13.0–17.0)
MCH: 31.5 pg (ref 26.0–34.0)
MCHC: 33.6 g/dL (ref 30.0–36.0)
MCV: 93.7 fL (ref 78.0–100.0)
Platelets: 162 10*3/uL (ref 150–400)
RBC: 4.79 MIL/uL (ref 4.22–5.81)
RDW: 13 % (ref 11.5–15.5)
WBC: 6.5 10*3/uL (ref 4.0–10.5)

## 2016-11-04 LAB — COMPREHENSIVE METABOLIC PANEL
ALK PHOS: 53 U/L (ref 38–126)
ALT: 20 U/L (ref 17–63)
ANION GAP: 6 (ref 5–15)
AST: 22 U/L (ref 15–41)
Albumin: 4 g/dL (ref 3.5–5.0)
BILIRUBIN TOTAL: 1 mg/dL (ref 0.3–1.2)
BUN: 14 mg/dL (ref 6–20)
CHLORIDE: 106 mmol/L (ref 101–111)
CO2: 28 mmol/L (ref 22–32)
Calcium: 9.3 mg/dL (ref 8.9–10.3)
Creatinine, Ser: 1.33 mg/dL — ABNORMAL HIGH (ref 0.61–1.24)
GFR calc Af Amer: >60 mL/min (ref >60)
GFR calc non Af Amer: 60 mL/min — ABNORMAL LOW (ref >60)
GLUCOSE: 72 mg/dL (ref 65–99)
Potassium: 4.7 mmol/L (ref 3.5–5.1)
Sodium: 140 mmol/L (ref 135–145)
TOTAL PROTEIN: 6.3 g/dL — AB (ref 6.5–8.1)

## 2016-11-04 LAB — LIPID PANEL
CHOL/HDL RATIO: 3.4 ratio
CHOLESTEROL: 181 mg/dL (ref 0–200)
HDL: 53 mg/dL (ref >40)
LDL CALC: 117 mg/dL — AB (ref 0–99)
Triglycerides: 57 mg/dL (ref <150)
VLDL: 11 mg/dL (ref 0–40)

## 2016-11-04 LAB — TSH: TSH: 4.566 u[IU]/mL — ABNORMAL HIGH (ref 0.350–4.500)

## 2016-11-04 MED ORDER — AMIODARONE HCL 100 MG PO TABS: 100.0000 mg | ORAL_TABLET | Freq: Every day | ORAL | 3 refills | Status: DC

## 2016-11-04 NOTE — Telephone Encounter (Signed)
Patient calling, states that he is returning call to Dr. Lubertha Basque office

## 2016-11-04 NOTE — Telephone Encounter (Signed)
Patient was called to schedule echo.  Patient is now scheduled for 11/16/16.  He is aware of location and to arrive 30 min earlier.

## 2016-11-04 NOTE — Progress Notes (Signed)
EF 60

## 2016-11-04 NOTE — Patient Instructions (Signed)
Decrease Amiodarone to 100 mg daily  Labs drawn today  Your physician recommends that you return for lab work in: 4 months every 4 months for three visits   Echo in 1 month: Your physician has requested that you have an echocardiogram. Echocardiography is a painless test that uses sound waves to create images of your heart. It provides your doctor with information about the size and shape of your heart and how well your heart's chambers and valves are working. This procedure takes approximately one hour. There are no restrictions for this procedure.  Your physician recommends that you schedule a follow-up appointment in: 1 year

## 2016-11-05 NOTE — Addendum Note (Signed)
Encounter addended by: Laurey Morale, MD on: 11/05/2016 10:40 PM<BR>    Actions taken: Sign clinical note

## 2016-11-05 NOTE — Progress Notes (Addendum)
Patient ID: Christopher Patrick, male   DOB: 04/19/1965, 51 y.o.   MRN: [id number] PCP: Dr. Leonette Most Cardiology: Dr. Shirlee Latch  51 yo with history of pulmonary sarcoidosis was admitted to [facility] in 1/16 after a VT arrest with 20 minutes CPR and defibrillation x 3.  He had therapeutic hypothermia.  He had good neurologic recovery.  He developed VT storm and ended up getting St Jude BiV ICD and starting amiodarone.  LHC showed no significant coronary disease.  Echo initially after arrest showed EF 15-20%, but cardiac MRI a week or so later showed EF 46% with LGE pattern concerning for cardiac sarcoidosis.  Patient also had complete heart block (treated with St Jude BiV ICD).  The cause of VT and CHB was thought to be cardiac sarcoidosis.  He was started on prednisone 60 mg daily and is following with pulmonary (Dr Craige Cotta).    He was readmitted in 2/16 with presyncope. After extensive evaluation, this was thought to be vagal in origin most likely.  No evidence for arrhythmia by device interrogation.  Echo showed EF improved back to 55-60% with mild RV dilation.  CTA chest showed no PE. Ramipril was decreased to 2.5 mg daily.   Patient has had no further syncope and no lightheadedness.  He is doing well, no exertional dypsnea and no chest pain.  Some fatigue with heavy exertion like shoveling dirt.  No problems walking up stairs. He is off prednisone. Weight is down about 16 lbs.    Corevue interrogation: Stable thoracic impedance, no VT, only 8.9% BiV-pacing (set to VVI now that he is conducting).   Labs (1/16): K 4.1, creatinine 1.18, LFTs normal, HCT 42.5, plts 91K Labs (2/16): LFTs normal, K 3.8, creatinine 1.19, TSH normal, LDL 129 Labs (11/16): LFTs normal, TSH normal Labs (2/17): LFTs normal, TSH normal, K 4, creatinine 1.21 Labs (6/17): LDL 101, LFTs normal, K 4.6, creatinine 1.39 Labs (7/17): TSH normal, free T3 normal  ECG (personally reviewed): sinus bradycardia  PMH: 1. Pulmonary  sarcoidosis: Diagnosed around 2011.  PFTs (2/16) with minimal abnormality.  2. 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   - Echo (1/16) with EF 15-20%, diffuse hypokinesis, RV moderately dilated with severe decreased systolic function (initially post-cardiac arrest).  - Cardiac MRI (1/16) with EF 46%, basal to mid anteroseptal and apical inferior hypokinesis, RV moderately dilated with mild to moderately decreased systolic function, RV EF 33%, patchy LGE in a pattern concerning for cardiac sarcoidosis.  - Echo (2/16) with EF 55-60%, mild RV dilation.  - Echo (2/17) with EF 50-55%, mildly 3. Ventricular tachycardia: Admission 1/16 with cardiac arrest and VT storm. Suspect due to cardiac sarcoidosis.  - LHC (1/16) with normal coronaries.  - St Jude BiV ICD placed, amiodarone started.  4. Complete heart block: Likely due to cardiac sarcoidosis.  St Jude BiV ICD placed.  5. GERD 6. Pre-syncope: 2/16, possibly vagal event.   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   Current Outpatient Prescriptions  Medication Sig Dispense Refill  . amiodarone (PACERONE) 100 MG tablet Take 1 tablet (100 mg total) by mouth daily. Take one tablet by mouth daily 30 tablet 3  . carvedilol (COREG) 12.5 MG tablet Take 1 tablet (12.5 mg total) by mouth 2 (two) times daily with a meal. 60 tablet 2  . ramipril (ALTACE) 2.5 MG capsule Take 1  capsule (2.5 mg total) by mouth daily. 90 capsule 3   No current facility-administered medications for this encounter.    BP 133/80   Pulse (!) 56   Wt 211 lb 8 oz (95.9 kg)   SpO2 99%   BMI 28.68 kg/m  General: NAD Neck: No JVD, no thyromegaly or thyroid nodule.  Lungs: Clear to auscultation bilaterally with normal respiratory effort. CV: Nondisplaced PMI.  Heart regular S1/S2, no S3/S4, no murmur.  No peripheral edema.  No carotid  bruit.  Normal pedal pulses.  Abdomen: Soft, nontender, no hepatosplenomegaly, no distention.  Skin: Intact without lesions or rashes.  Neurologic: Alert and oriented x 3.  Psych: Normal affect. Extremities: No clubbing or cyanosis.  HEENT: Normal.   Assessment/Plan: 1. Cardiac sarcoidosis: I suspect this is the etiology of cardiomyopathy, VT, and complete heart block.  Echo 2/17 with EF 50-55%.  Pulmonary sarcoidosis has been quiescent and he is off prednisone. He may have had some conduction disease with prednisone as conduction system appears improved (BiV device has been set to VVI backup). 2. Chronic systolic CHF: Initial EF 15-20% in hospital, suspect this was due to stunning from cardiac arrest.  Cardiac MRI a week or so later showed LV EF 46% with moderate RV systolic dysfunction. EF back to 50-55% on most recent echo in 2/17. He looks euvolemic on exam today.  NYHA class I-II symptoms.  - Continue ramipril and will continue Coreg to 12.5 mg bid given VT history.  - I will arrange for repeat echo.  3. VT: Has ICD, on amiodarone.   - Continue amiodarone but will decrease to 100 mg daily.  His eye doctor noted some sign of amiodarone deposition per patient but not enough to recommend stopping it.  He will make sure to have regular eye exams. Check LFTs and TSH. He will need PFTs through pulmonary (follows with Dr. Craige Cotta).   4. Complete heart block: St Jude CRT. Given improved cardiac conduction, perhaps with steroid treatment for sarcoidosis, device now set to VVI backup.   Marca Ancona 11/05/2016

## 2016-11-08 ENCOUNTER — Telehealth (HOSPITAL_COMMUNITY): Payer: Self-pay | Admitting: Cardiology

## 2016-11-08 DIAGNOSIS — R7989 Other specified abnormal findings of blood chemistry: Secondary | ICD-10-CM

## 2016-11-08 NOTE — Telephone Encounter (Signed)
Returned call regarding lab results Results given to patient, reports he will have labs done when he returns for echo 9/12, orders placed

## 2016-11-16 ENCOUNTER — Ambulatory Visit (HOSPITAL_COMMUNITY): Payer: 59 | Attending: Cardiovascular Disease

## 2016-11-16 ENCOUNTER — Telehealth (HOSPITAL_COMMUNITY): Payer: Self-pay | Admitting: *Deleted

## 2016-11-16 ENCOUNTER — Other Ambulatory Visit: Payer: 59 | Admitting: *Deleted

## 2016-11-16 ENCOUNTER — Other Ambulatory Visit: Payer: Self-pay

## 2016-11-16 DIAGNOSIS — I428 Other cardiomyopathies: Secondary | ICD-10-CM

## 2016-11-16 DIAGNOSIS — I4510 Unspecified right bundle-branch block: Secondary | ICD-10-CM

## 2016-11-16 DIAGNOSIS — Z95810 Presence of automatic (implantable) cardiac defibrillator: Secondary | ICD-10-CM

## 2016-11-16 DIAGNOSIS — I517 Cardiomegaly: Secondary | ICD-10-CM | POA: Diagnosis not present

## 2016-11-16 DIAGNOSIS — D8685 Sarcoid myocarditis: Secondary | ICD-10-CM

## 2016-11-16 DIAGNOSIS — I472 Ventricular tachycardia, unspecified: Secondary | ICD-10-CM

## 2016-11-16 DIAGNOSIS — I442 Atrioventricular block, complete: Secondary | ICD-10-CM

## 2016-11-16 DIAGNOSIS — I469 Cardiac arrest, cause unspecified: Secondary | ICD-10-CM

## 2016-11-16 LAB — TSH: TSH: 4.75 u[IU]/mL — ABNORMAL HIGH (ref 0.450–4.500)

## 2016-11-16 LAB — T4, FREE: Free T4: 1.56 ng/dL (ref 0.82–1.77)

## 2016-11-16 LAB — T3, FREE: T3, Free: 2.5 pg/mL (ref 2.0–4.4)

## 2016-11-16 NOTE — Telephone Encounter (Signed)
ECHOCARDIOGRAM COMPLETE  Order: [id number]  Status:  Final result Visible to patient:  Yes (MyChart) Dx:  Cardiac sarcoidosis  Notes recorded by Georgina Peer, RN on 11/16/2016 at 11:05 AM EDT Called and spoke with patient, he is aware and no further questions. ------  Notes recorded by Laurey Morale, MD on 11/16/2016 at 9:01 AM EDT EF 60-65% with mild LVH

## 2016-11-17 ENCOUNTER — Telehealth (HOSPITAL_COMMUNITY): Payer: Self-pay | Admitting: *Deleted

## 2016-11-17 NOTE — Telephone Encounter (Signed)
TSH  Order: [id number]  Status:  Final result Visible to patient:  Yes (MyChart) Dx:  VT (ventricular tachycardia) - Multif...  Notes recorded by Georgina Peer, RN on 11/17/2016 at 4:15 PM EDT Called and spoke with patient he is aware and no further questions. I will fax results to Dr. Leonette Most at [facility] who is his PCP. ------  Notes recorded by Laurey Morale, MD on 11/16/2016 at 4:19 PM EDT TSH elevated but free T3 and T4 normal. No changes.

## 2016-11-23 ENCOUNTER — Other Ambulatory Visit: Payer: Self-pay | Admitting: Internal Medicine

## 2016-12-01 ENCOUNTER — Ambulatory Visit (INDEPENDENT_AMBULATORY_CARE_PROVIDER_SITE_OTHER): Payer: 59 | Admitting: *Deleted

## 2016-12-01 DIAGNOSIS — I428 Other cardiomyopathies: Secondary | ICD-10-CM | POA: Diagnosis not present

## 2016-12-01 NOTE — Progress Notes (Signed)
Remote ICD transmission.   

## 2016-12-05 LAB — CUP PACEART REMOTE DEVICE CHECK
Battery Remaining Longevity: 69 mo
Battery Remaining Percentage: 63 %
Date Time Interrogation Session: 20180927060015
HIGH POWER IMPEDANCE MEASURED VALUE: 65 Ohm
HIGH POWER IMPEDANCE MEASURED VALUE: 65 Ohm
Implantable Lead Implant Date: 20160111
Implantable Lead Implant Date: 20160111
Implantable Lead Implant Date: 20160111
Implantable Lead Location: 753860
Implantable Lead Model: 181
Implantable Lead Serial Number: 326242
Lead Channel Impedance Value: 400 Ohm
Lead Channel Impedance Value: 480 Ohm
Lead Channel Impedance Value: 560 Ohm
Lead Channel Pacing Threshold Pulse Width: 0.5 ms
Lead Channel Setting Pacing Amplitude: 2.5 V
Lead Channel Setting Pacing Pulse Width: 0.5 ms
Lead Channel Setting Pacing Pulse Width: 0.5 ms
MDC IDC LEAD LOCATION: 753858
MDC IDC LEAD LOCATION: 753859
MDC IDC MSMT BATTERY VOLTAGE: 2.98 V
MDC IDC MSMT LEADCHNL LV PACING THRESHOLD AMPLITUDE: 1.5 V
MDC IDC MSMT LEADCHNL LV PACING THRESHOLD PULSEWIDTH: 0.5 ms
MDC IDC MSMT LEADCHNL RA SENSING INTR AMPL: 5 mV
MDC IDC MSMT LEADCHNL RV PACING THRESHOLD AMPLITUDE: 1 V
MDC IDC MSMT LEADCHNL RV SENSING INTR AMPL: 11.4 mV
MDC IDC PG IMPLANT DT: 20160111
MDC IDC SET LEADCHNL RV PACING AMPLITUDE: 2.5 V
MDC IDC SET LEADCHNL RV SENSING SENSITIVITY: 2 mV
Pulse Gen Serial Number: 7210309

## 2016-12-06 ENCOUNTER — Encounter: Payer: Self-pay | Admitting: Cardiology

## 2016-12-13 ENCOUNTER — Other Ambulatory Visit (HOSPITAL_COMMUNITY): Payer: Self-pay | Admitting: Cardiology

## 2016-12-13 DIAGNOSIS — I428 Other cardiomyopathies: Secondary | ICD-10-CM

## 2016-12-23 ENCOUNTER — Encounter: Payer: Self-pay | Admitting: Cardiology

## 2017-02-07 ENCOUNTER — Other Ambulatory Visit (HOSPITAL_COMMUNITY): Payer: Self-pay | Admitting: Internal Medicine

## 2017-02-07 DIAGNOSIS — I428 Other cardiomyopathies: Secondary | ICD-10-CM

## 2017-03-01 DIAGNOSIS — I472 Ventricular tachycardia, unspecified: Secondary | ICD-10-CM | POA: Insufficient documentation

## 2017-03-01 DIAGNOSIS — I4510 Unspecified right bundle-branch block: Secondary | ICD-10-CM | POA: Insufficient documentation

## 2017-03-03 ENCOUNTER — Ambulatory Visit (INDEPENDENT_AMBULATORY_CARE_PROVIDER_SITE_OTHER): Payer: 59 | Admitting: *Deleted

## 2017-03-03 DIAGNOSIS — I428 Other cardiomyopathies: Secondary | ICD-10-CM | POA: Diagnosis not present

## 2017-03-03 NOTE — Progress Notes (Signed)
Remote ICD transmission.   

## 2017-03-06 ENCOUNTER — Other Ambulatory Visit (HOSPITAL_COMMUNITY): Payer: Self-pay

## 2017-03-06 ENCOUNTER — Ambulatory Visit (HOSPITAL_COMMUNITY)
Admission: RE | Admit: 2017-03-06 | Discharge: 2017-03-06 | Disposition: A | Discharge status: Home / Self Care | Payer: 59 | Source: Ambulatory Visit | Attending: Cardiology | Admitting: Cardiology

## 2017-03-06 ENCOUNTER — Encounter: Payer: Self-pay | Admitting: Cardiology

## 2017-03-06 DIAGNOSIS — I5022 Chronic systolic (congestive) heart failure: Secondary | ICD-10-CM | POA: Diagnosis not present

## 2017-03-06 LAB — COMPREHENSIVE METABOLIC PANEL
ALBUMIN: 3.7 g/dL (ref 3.5–5.0)
ALT: 22 U/L (ref 17–63)
AST: 29 U/L (ref 15–41)
Alkaline Phosphatase: 59 U/L (ref 38–126)
Anion gap: 8 (ref 5–15)
BUN: 18 mg/dL (ref 6–20)
CHLORIDE: 104 mmol/L (ref 101–111)
CO2: 24 mmol/L (ref 22–32)
CREATININE: 1.46 mg/dL — AB (ref 0.61–1.24)
Calcium: 8.7 mg/dL — ABNORMAL LOW (ref 8.9–10.3)
GFR calc Af Amer: >60 mL/min (ref >60)
GFR calc non Af Amer: 53 mL/min — ABNORMAL LOW (ref >60)
GLUCOSE: 152 mg/dL — AB (ref 65–99)
Potassium: 4 mmol/L (ref 3.5–5.1)
Sodium: 136 mmol/L (ref 135–145)
Total Bilirubin: 0.9 mg/dL (ref 0.3–1.2)
Total Protein: 6 g/dL — ABNORMAL LOW (ref 6.5–8.1)

## 2017-03-06 LAB — TSH: TSH: 5.509 u[IU]/mL — AB (ref 0.350–4.500)

## 2017-03-07 ENCOUNTER — Other Ambulatory Visit: Payer: Self-pay | Admitting: Internal Medicine

## 2017-03-07 ENCOUNTER — Other Ambulatory Visit (HOSPITAL_COMMUNITY): Payer: Self-pay | Admitting: Cardiology

## 2017-03-07 DIAGNOSIS — I428 Other cardiomyopathies: Secondary | ICD-10-CM

## 2017-03-07 DIAGNOSIS — I472 Ventricular tachycardia, unspecified: Secondary | ICD-10-CM

## 2017-03-09 ENCOUNTER — Other Ambulatory Visit: Payer: Self-pay | Admitting: Internal Medicine

## 2017-03-09 DIAGNOSIS — I428 Other cardiomyopathies: Secondary | ICD-10-CM

## 2017-03-10 ENCOUNTER — Other Ambulatory Visit (HOSPITAL_COMMUNITY): Payer: Self-pay

## 2017-03-10 ENCOUNTER — Other Ambulatory Visit (HOSPITAL_COMMUNITY): Payer: 59

## 2017-03-10 ENCOUNTER — Telehealth (HOSPITAL_COMMUNITY): Payer: Self-pay

## 2017-03-10 DIAGNOSIS — E038 Other specified hypothyroidism: Secondary | ICD-10-CM

## 2017-03-10 NOTE — Telephone Encounter (Signed)
Notes recorded by Teresa Coombs, RN on 03/10/2017 at 10:58 AM EST Pt aware of results, coming in today for lab work orders placed ------  Notes recorded by Laurey Morale, MD on 03/08/2017 at 9:17 PM EST Mildly elevated TSH. Check free T3 and free T4

## 2017-03-14 ENCOUNTER — Ambulatory Visit (HOSPITAL_COMMUNITY)
Admission: RE | Admit: 2017-03-14 | Discharge: 2017-03-14 | Disposition: A | Discharge status: Home / Self Care | Payer: 59 | Source: Ambulatory Visit | Attending: Cardiology | Admitting: Cardiology

## 2017-03-14 DIAGNOSIS — E038 Other specified hypothyroidism: Secondary | ICD-10-CM | POA: Diagnosis not present

## 2017-03-14 LAB — T4, FREE: Free T4: 1.33 ng/dL — ABNORMAL HIGH (ref 0.61–1.12)

## 2017-03-15 LAB — T3, FREE: T3, Free: 2.8 pg/mL (ref 2.0–4.4)

## 2017-03-17 ENCOUNTER — Encounter: Payer: Self-pay | Admitting: Internal Medicine

## 2017-03-17 ENCOUNTER — Ambulatory Visit: Payer: 59 | Admitting: Internal Medicine

## 2017-03-17 VITALS — BP 148/88 | HR 49 | Ht 72.0 in | Wt 225.2 lb

## 2017-03-17 DIAGNOSIS — I5022 Chronic systolic (congestive) heart failure: Secondary | ICD-10-CM

## 2017-03-17 DIAGNOSIS — I428 Other cardiomyopathies: Secondary | ICD-10-CM | POA: Diagnosis not present

## 2017-03-17 DIAGNOSIS — Z95810 Presence of automatic (implantable) cardiac defibrillator: Secondary | ICD-10-CM

## 2017-03-17 DIAGNOSIS — I442 Atrioventricular block, complete: Secondary | ICD-10-CM | POA: Diagnosis not present

## 2017-03-17 NOTE — Progress Notes (Signed)
HPI Mr. Taveras returns today for followup. He is a pleasant 52 yo man with a h/o sarcoidosis cardiomyopathy, VT, transient CHB, s/p Biv ICD insertion. He has done well in the interim although he notes that his thyroid has been off. He has an elevated T4 but normal free T3. He also thinks that his vision is changing and wonders if it is related to the amiodarone. He is currently on 100 mg daily. He feels well otherwise. Allergies  Allergen Reactions  . Benadryl [Diphenhydramine Hcl (Sleep)] Anaphylaxis  . Diphenhydramine Swelling     Current Outpatient Medications  Medication Sig Dispense Refill  . amiodarone (PACERONE) 100 MG tablet TAKE ONE TABLET BY MOUTH DAILY 30 tablet 2  . carvedilol (COREG) 12.5 MG tablet TAKE ONE TABLET BY MOUTH TWICE A DAY WITH A MEAL 60 tablet 6  . ramipril (ALTACE) 2.5 MG capsule Take 1 capsule (2.5 mg total) by mouth daily. Keep upcoming appointment. 30 capsule 0   No current facility-administered medications for this visit.      Past Medical History:  Diagnosis Date  . Acute renal insufficiency    a. During adm 03/2014 for VT arrest.  . Cardiac arrest (HCC)    a. VT arrest with recurrent VT 03/2014, s/p CPR, multiple defib. Ultimately felt 2/2 cardiac sarcoid. Placed on amiodarone. Hospitalization complicated by circulatory shock, VDRF, encephalopathy, AKI, and hiccups which improved by discharge.  . Cardiac sarcoidosis   . Complete heart block (HCC)    a. s/p St Jude BiV ICD/pacemaker placement w/ BS leads 03/17/14  . GERD (gastroesophageal reflux disease)   . Non-ischemic cardiomyopathy (HCC)    a. EF15-20% by echo 03/12/14 - s/p BiV-ICD placement. Normal cors 03/2014.  . Pulmonary sarcoidosis (HCC)   . RBBB   . Recurrent ventricular tachycardia (HCC)     ROS:   All systems reviewed and negative except as noted in the HPI.   Past Surgical History:  Procedure Laterality Date  . BI-VENTRICULAR IMPLANTABLE CARDIOVERTER DEFIBRILLATOR N/A  03/17/2014   Procedure: BI-VENTRICULAR IMPLANTABLE CARDIOVERTER DEFIBRILLATOR  (CRT-D);  Surgeon: Marinus Maw, MD;  Location: [facility] CATH LAB;  Service: Cardiovascular;  Laterality: N/A;  . LEFT HEART CATHETERIZATION WITH CORONARY ANGIOGRAM N/A 03/12/2014   Procedure: LEFT HEART CATHETERIZATION WITH CORONARY ANGIOGRAM;  Surgeon: Iran Ouch, MD;  Location: [facility] CATH LAB;  Service: Cardiovascular;  Laterality: N/A;     Family History  Problem Relation Age of Onset  . Stroke Father   . Hypertension Maternal Grandfather   . Hypertension Maternal Grandmother      Social History   Socioeconomic History  . Marital status: Married    Spouse name: Not on file  . Number of children: Not on file  . Years of education: Not on file  . Highest education level: Not on file  Social Needs  . Financial resource strain: Not on file  . Food insecurity - worry: Not on file  . Food insecurity - inability: Not on file  . Transportation needs - medical: Not on file  . Transportation needs - non-medical: Not on file  Occupational History  . Occupation: Emergency planning/management officer  Tobacco Use  . Smoking status: Passive Smoke Exposure - Never Smoker  . Smokeless tobacco: Never Used  . Tobacco comment: second hand as child  Substance and Sexual Activity  . Alcohol use: No    Alcohol/week: 0.0 oz  . Drug use: No  . Sexual activity: Not on file  Other Topics  Concern  . Not on file  Social History Narrative  . Not on file     BP (!) 148/88   Pulse (!) 49   Ht 6' (1.829 m)   Wt 225 lb 3.2 oz (102.2 kg)   SpO2 98%   BMI 30.54 kg/m   Physical Exam:  Well appearing 52 yo man, NAD HEENT: Unremarkable Neck:  6 cm JVD, no thyromegally Lymphatics:  No adenopathy Back:  No CVA tenderness Lungs:  Clear with no wheezes HEART:  Regular rate rhythm, no murmurs, no rubs, no clicks Abd:  soft, positive bowel sounds, no organomegally, no rebound, no guarding Ext:  2 plus pulses, no edema, no cyanosis, no  clubbing Skin:  No rashes no nodules Neuro:  CN II through XII intact, motor grossly intact  EKG - sinus bradycardia.  DEVICE  Normal device function.  See PaceArt for details.   Assess/Plan: 1. VT - he has had no more VT. HE will continue the amio for now. If he is thought to have amio induced eye changes then I have asked him to stop the amiodarone. 2. Chronic systolic heart failure - his symptoms are well controlled on the beta blocker and ACE inhibitor. I will defer repeat 2D echo to Dr. Shirlee Latch. His last showed that his EF had normalized. 3. CHB - his conduction has improved. No change in meds. 4. ICD - His St. Jude DDD BIV ICD is working normally. Will recheck in several months.  Christopher Patrick.D.

## 2017-03-17 NOTE — Patient Instructions (Signed)
Medication Instructions:  Your physician recommends that you continue on your current medications as directed. Please refer to the Current Medication list given to you today.  Labwork: None ordered.  Testing/Procedures: None ordered.  Follow-Up: Your physician wants you to follow-up in: one year with Dr. Ladona Ridgel.   You will receive a reminder letter in the mail two months in advance. If you don't receive a letter, please call our office to schedule the follow-up appointment.  Remote monitoring is used to monitor your ICD from home. This monitoring reduces the number of office visits required to check your device to one time per year. It allows [place] to keep an eye on the functioning of your device to ensure it is working properly. You are scheduled for a device check from home on 06/06/2017. You may send your transmission at any time that day. If you have a wireless device, the transmission will be sent automatically. After your physician reviews your transmission, you will receive a postcard with your next transmission date.    Any Other Special Instructions Will Be Listed Below (If Applicable).     If you need a refill on your cardiac medications before your next appointment, please call your pharmacy.

## 2017-03-21 LAB — CUP PACEART REMOTE DEVICE CHECK
Battery Remaining Longevity: 68 mo
Date Time Interrogation Session: 20181228124232
HIGH POWER IMPEDANCE MEASURED VALUE: 69 Ohm
HIGH POWER IMPEDANCE MEASURED VALUE: 69 Ohm
Implantable Lead Implant Date: 20160111
Implantable Lead Implant Date: 20160111
Implantable Lead Location: 753860
Implantable Lead Model: 181
Implantable Lead Serial Number: 326242
Lead Channel Impedance Value: 400 Ohm
Lead Channel Sensing Intrinsic Amplitude: 5 mV
Lead Channel Setting Pacing Amplitude: 2.5 V
Lead Channel Setting Pacing Pulse Width: 0.5 ms
Lead Channel Setting Pacing Pulse Width: 0.5 ms
MDC IDC LEAD IMPLANT DT: 20160111
MDC IDC LEAD LOCATION: 753858
MDC IDC LEAD LOCATION: 753859
MDC IDC MSMT BATTERY REMAINING PERCENTAGE: 61 %
MDC IDC MSMT BATTERY VOLTAGE: 2.98 V
MDC IDC MSMT LEADCHNL LV IMPEDANCE VALUE: 510 Ohm
MDC IDC MSMT LEADCHNL LV PACING THRESHOLD AMPLITUDE: 1.5 V
MDC IDC MSMT LEADCHNL LV PACING THRESHOLD PULSEWIDTH: 0.5 ms
MDC IDC MSMT LEADCHNL RV IMPEDANCE VALUE: 580 Ohm
MDC IDC MSMT LEADCHNL RV PACING THRESHOLD AMPLITUDE: 1 V
MDC IDC MSMT LEADCHNL RV PACING THRESHOLD PULSEWIDTH: 0.5 ms
MDC IDC MSMT LEADCHNL RV SENSING INTR AMPL: 11.4 mV
MDC IDC PG IMPLANT DT: 20160111
MDC IDC SET LEADCHNL LV PACING AMPLITUDE: 2.5 V
MDC IDC SET LEADCHNL RV SENSING SENSITIVITY: 2 mV
Pulse Gen Serial Number: 7210309

## 2017-03-21 LAB — CUP PACEART INCLINIC DEVICE CHECK
Date Time Interrogation Session: 20190111222912
HIGH POWER IMPEDANCE MEASURED VALUE: 73.125
Implantable Lead Implant Date: 20160111
Implantable Lead Implant Date: 20160111
Implantable Lead Location: 753858
Implantable Lead Location: 753859
Implantable Pulse Generator Implant Date: 20160111
Lead Channel Impedance Value: 475 Ohm
Lead Channel Impedance Value: 587.5 Ohm
Lead Channel Pacing Threshold Amplitude: 1 V
Lead Channel Pacing Threshold Amplitude: 1.25 V
Lead Channel Pacing Threshold Pulse Width: 0.5 ms
Lead Channel Pacing Threshold Pulse Width: 0.5 ms
Lead Channel Pacing Threshold Pulse Width: 0.5 ms
Lead Channel Pacing Threshold Pulse Width: 0.5 ms
Lead Channel Sensing Intrinsic Amplitude: 11.4 mV
Lead Channel Sensing Intrinsic Amplitude: 5 mV
Lead Channel Setting Pacing Amplitude: 2.5 V
Lead Channel Setting Pacing Pulse Width: 0.5 ms
Lead Channel Setting Pacing Pulse Width: 0.5 ms
Lead Channel Setting Sensing Sensitivity: 2 mV
MDC IDC LEAD IMPLANT DT: 20160111
MDC IDC LEAD LOCATION: 753860
MDC IDC LEAD SERIAL: 326242
MDC IDC MSMT BATTERY REMAINING LONGEVITY: 72 mo
MDC IDC MSMT LEADCHNL LV PACING THRESHOLD AMPLITUDE: 1.25 V
MDC IDC MSMT LEADCHNL RA IMPEDANCE VALUE: 425 Ohm
MDC IDC MSMT LEADCHNL RV PACING THRESHOLD AMPLITUDE: 1 V
MDC IDC MSMT LEADCHNL RV PACING THRESHOLD AMPLITUDE: 1 V
MDC IDC MSMT LEADCHNL RV PACING THRESHOLD PULSEWIDTH: 0.5 ms
MDC IDC SET LEADCHNL LV PACING AMPLITUDE: 2.5 V
MDC IDC STAT BRADY RA PERCENT PACED: 0 %
MDC IDC STAT BRADY RV PERCENT PACED: 6.8 %
Pulse Gen Serial Number: 7210309

## 2017-03-21 NOTE — Addendum Note (Signed)
Addended by: Vernard Gambles on: 03/21/2017 07:22 AM   Modules accepted: Orders

## 2017-04-06 ENCOUNTER — Other Ambulatory Visit: Payer: Self-pay | Admitting: Internal Medicine

## 2017-04-06 DIAGNOSIS — I428 Other cardiomyopathies: Secondary | ICD-10-CM

## 2017-06-05 ENCOUNTER — Other Ambulatory Visit (HOSPITAL_COMMUNITY): Payer: Self-pay | Admitting: Cardiology

## 2017-06-05 DIAGNOSIS — I472 Ventricular tachycardia, unspecified: Secondary | ICD-10-CM

## 2017-06-06 ENCOUNTER — Telehealth: Payer: Self-pay | Admitting: Cardiology

## 2017-06-06 ENCOUNTER — Ambulatory Visit (INDEPENDENT_AMBULATORY_CARE_PROVIDER_SITE_OTHER): Payer: 59 | Admitting: *Deleted

## 2017-06-06 DIAGNOSIS — I428 Other cardiomyopathies: Secondary | ICD-10-CM | POA: Diagnosis not present

## 2017-06-06 NOTE — Telephone Encounter (Signed)
Spoke with pt and reminded pt of remote transmission that is due today. Pt verbalized understanding.   

## 2017-06-08 ENCOUNTER — Encounter: Payer: Self-pay | Admitting: Cardiology

## 2017-06-08 NOTE — Progress Notes (Signed)
Remote ICD transmission.   

## 2017-06-29 LAB — CUP PACEART REMOTE DEVICE CHECK
Date Time Interrogation Session: 20190404041451
HighPow Impedance: 63 Ohm
HighPow Impedance: 63 Ohm
Implantable Lead Implant Date: 20160111
Implantable Lead Implant Date: 20160111
Implantable Lead Location: 753858
Implantable Lead Location: 753859
Implantable Lead Location: 753860
Implantable Pulse Generator Implant Date: 20160111
Lead Channel Impedance Value: 400 Ohm
Lead Channel Impedance Value: 510 Ohm
Lead Channel Pacing Threshold Amplitude: 1 V
Lead Channel Pacing Threshold Amplitude: 1.25 V
Lead Channel Pacing Threshold Pulse Width: 0.5 ms
Lead Channel Pacing Threshold Pulse Width: 0.5 ms
Lead Channel Sensing Intrinsic Amplitude: 4.6 mV
Lead Channel Setting Pacing Amplitude: 2.5 V
Lead Channel Setting Pacing Amplitude: 2.5 V
Lead Channel Setting Pacing Pulse Width: 0.5 ms
Lead Channel Setting Sensing Sensitivity: 2 mV
MDC IDC LEAD IMPLANT DT: 20160111
MDC IDC LEAD SERIAL: 326242
MDC IDC MSMT BATTERY REMAINING LONGEVITY: 65 mo
MDC IDC MSMT BATTERY REMAINING PERCENTAGE: 59 %
MDC IDC MSMT BATTERY VOLTAGE: 2.96 V
MDC IDC MSMT LEADCHNL RV IMPEDANCE VALUE: 550 Ohm
MDC IDC MSMT LEADCHNL RV SENSING INTR AMPL: 11.4 mV
MDC IDC PG SERIAL: 7210309
MDC IDC SET LEADCHNL LV PACING PULSEWIDTH: 0.5 ms

## 2017-08-10 ENCOUNTER — Other Ambulatory Visit (HOSPITAL_COMMUNITY): Payer: Self-pay | Admitting: Cardiology

## 2017-08-10 DIAGNOSIS — I472 Ventricular tachycardia, unspecified: Secondary | ICD-10-CM

## 2017-09-05 ENCOUNTER — Telehealth: Payer: Self-pay | Admitting: Cardiology

## 2017-09-05 ENCOUNTER — Ambulatory Visit (INDEPENDENT_AMBULATORY_CARE_PROVIDER_SITE_OTHER): Payer: 59 | Admitting: *Deleted

## 2017-09-05 DIAGNOSIS — I428 Other cardiomyopathies: Secondary | ICD-10-CM | POA: Diagnosis not present

## 2017-09-05 NOTE — Telephone Encounter (Signed)
Spoke with pt and reminded pt of remote transmission that is due today. Pt verbalized understanding.   

## 2017-09-06 ENCOUNTER — Encounter: Payer: Self-pay | Admitting: Cardiology

## 2017-09-06 NOTE — Progress Notes (Signed)
Remote ICD transmission.   

## 2017-09-09 ENCOUNTER — Other Ambulatory Visit (HOSPITAL_COMMUNITY): Payer: Self-pay | Admitting: Cardiology

## 2017-09-09 DIAGNOSIS — I472 Ventricular tachycardia, unspecified: Secondary | ICD-10-CM

## 2017-09-09 DIAGNOSIS — I428 Other cardiomyopathies: Secondary | ICD-10-CM

## 2017-10-04 LAB — CUP PACEART REMOTE DEVICE CHECK
Battery Remaining Longevity: 62 mo
Date Time Interrogation Session: 20190703044645
HIGH POWER IMPEDANCE MEASURED VALUE: 71 Ohm
HIGH POWER IMPEDANCE MEASURED VALUE: 71 Ohm
Implantable Lead Implant Date: 20160111
Implantable Lead Location: 753858
Implantable Lead Location: 753859
Implantable Lead Location: 753860
Implantable Lead Model: 181
Implantable Lead Serial Number: 326242
Lead Channel Impedance Value: 430 Ohm
Lead Channel Impedance Value: 600 Ohm
Lead Channel Pacing Threshold Pulse Width: 0.5 ms
Lead Channel Sensing Intrinsic Amplitude: 5 mV
Lead Channel Setting Pacing Amplitude: 2.5 V
Lead Channel Setting Pacing Amplitude: 2.5 V
Lead Channel Setting Pacing Pulse Width: 0.5 ms
Lead Channel Setting Pacing Pulse Width: 0.5 ms
MDC IDC LEAD IMPLANT DT: 20160111
MDC IDC LEAD IMPLANT DT: 20160111
MDC IDC MSMT BATTERY REMAINING PERCENTAGE: 56 %
MDC IDC MSMT BATTERY VOLTAGE: 2.95 V
MDC IDC MSMT LEADCHNL LV IMPEDANCE VALUE: 510 Ohm
MDC IDC MSMT LEADCHNL LV PACING THRESHOLD AMPLITUDE: 1.25 V
MDC IDC MSMT LEADCHNL LV PACING THRESHOLD PULSEWIDTH: 0.5 ms
MDC IDC MSMT LEADCHNL RV PACING THRESHOLD AMPLITUDE: 1 V
MDC IDC MSMT LEADCHNL RV SENSING INTR AMPL: 11.4 mV
MDC IDC PG IMPLANT DT: 20160111
MDC IDC SET LEADCHNL RV SENSING SENSITIVITY: 2 mV
Pulse Gen Serial Number: 7210309

## 2017-10-10 ENCOUNTER — Other Ambulatory Visit (HOSPITAL_COMMUNITY): Payer: Self-pay | Admitting: Cardiology

## 2017-10-10 DIAGNOSIS — I428 Other cardiomyopathies: Secondary | ICD-10-CM

## 2017-10-10 DIAGNOSIS — I472 Ventricular tachycardia, unspecified: Secondary | ICD-10-CM

## 2017-11-09 ENCOUNTER — Other Ambulatory Visit (HOSPITAL_COMMUNITY): Payer: Self-pay | Admitting: Cardiology

## 2017-11-09 DIAGNOSIS — I472 Ventricular tachycardia, unspecified: Secondary | ICD-10-CM

## 2017-11-09 DIAGNOSIS — I428 Other cardiomyopathies: Secondary | ICD-10-CM

## 2017-12-05 ENCOUNTER — Ambulatory Visit (INDEPENDENT_AMBULATORY_CARE_PROVIDER_SITE_OTHER): Payer: 59 | Admitting: *Deleted

## 2017-12-05 DIAGNOSIS — I428 Other cardiomyopathies: Secondary | ICD-10-CM

## 2017-12-05 DIAGNOSIS — I5022 Chronic systolic (congestive) heart failure: Secondary | ICD-10-CM

## 2017-12-05 NOTE — Progress Notes (Signed)
Remote ICD transmission.   

## 2017-12-28 LAB — CUP PACEART REMOTE DEVICE CHECK
Battery Remaining Percentage: 54 %
Battery Voltage: 2.95 V
Date Time Interrogation Session: 20191001072206
HighPow Impedance: 78 Ohm
HighPow Impedance: 78 Ohm
Implantable Lead Implant Date: 20160111
Implantable Lead Location: 753858
Implantable Lead Location: 753859
Implantable Lead Model: 181
Implantable Pulse Generator Implant Date: 20160111
Lead Channel Impedance Value: 430 Ohm
Lead Channel Impedance Value: 550 Ohm
Lead Channel Impedance Value: 590 Ohm
Lead Channel Pacing Threshold Amplitude: 1.25 V
Lead Channel Pacing Threshold Pulse Width: 0.5 ms
Lead Channel Pacing Threshold Pulse Width: 0.5 ms
Lead Channel Setting Pacing Amplitude: 2.5 V
Lead Channel Setting Pacing Pulse Width: 0.5 ms
Lead Channel Setting Sensing Sensitivity: 2 mV
MDC IDC LEAD IMPLANT DT: 20160111
MDC IDC LEAD IMPLANT DT: 20160111
MDC IDC LEAD LOCATION: 753860
MDC IDC LEAD SERIAL: 326242
MDC IDC MSMT BATTERY REMAINING LONGEVITY: 60 mo
MDC IDC MSMT LEADCHNL RA SENSING INTR AMPL: 5 mV
MDC IDC MSMT LEADCHNL RV PACING THRESHOLD AMPLITUDE: 1 V
MDC IDC MSMT LEADCHNL RV SENSING INTR AMPL: 11.4 mV
MDC IDC PG SERIAL: 7210309
MDC IDC SET LEADCHNL LV PACING PULSEWIDTH: 0.5 ms
MDC IDC SET LEADCHNL RV PACING AMPLITUDE: 2.5 V

## 2018-01-09 ENCOUNTER — Other Ambulatory Visit (HOSPITAL_COMMUNITY): Payer: Self-pay | Admitting: Cardiology

## 2018-01-09 DIAGNOSIS — I472 Ventricular tachycardia, unspecified: Secondary | ICD-10-CM

## 2018-01-09 DIAGNOSIS — I428 Other cardiomyopathies: Secondary | ICD-10-CM

## 2018-01-10 ENCOUNTER — Other Ambulatory Visit (HOSPITAL_COMMUNITY): Payer: Self-pay | Admitting: Cardiology

## 2018-01-10 DIAGNOSIS — I472 Ventricular tachycardia, unspecified: Secondary | ICD-10-CM

## 2018-01-10 DIAGNOSIS — I428 Other cardiomyopathies: Secondary | ICD-10-CM

## 2018-01-15 ENCOUNTER — Other Ambulatory Visit (HOSPITAL_COMMUNITY): Payer: Self-pay

## 2018-01-15 DIAGNOSIS — I472 Ventricular tachycardia, unspecified: Secondary | ICD-10-CM

## 2018-01-15 DIAGNOSIS — I428 Other cardiomyopathies: Secondary | ICD-10-CM

## 2018-01-15 MED ORDER — CARVEDILOL 12.5 MG PO TABS: 12.5000 mg | ORAL_TABLET | Freq: Two times a day (BID) | ORAL | 2 refills | Status: DC

## 2018-01-15 MED ORDER — AMIODARONE HCL 100 MG PO TABS: 100.0000 mg | ORAL_TABLET | Freq: Every day | ORAL | 2 refills | Status: DC

## 2018-03-02 ENCOUNTER — Encounter: Payer: Self-pay | Admitting: Internal Medicine

## 2018-03-06 ENCOUNTER — Telehealth: Payer: Self-pay

## 2018-03-06 ENCOUNTER — Ambulatory Visit (INDEPENDENT_AMBULATORY_CARE_PROVIDER_SITE_OTHER): Payer: 59

## 2018-03-06 DIAGNOSIS — I428 Other cardiomyopathies: Secondary | ICD-10-CM | POA: Diagnosis not present

## 2018-03-06 DIAGNOSIS — I5022 Chronic systolic (congestive) heart failure: Secondary | ICD-10-CM

## 2018-03-06 NOTE — Telephone Encounter (Signed)
Spoke with patient to remind of missed remote transmission 

## 2018-03-07 LAB — CUP PACEART REMOTE DEVICE CHECK
Battery Remaining Longevity: 58 mo
Battery Voltage: 2.95 V
Date Time Interrogation Session: 20200101075824
HIGH POWER IMPEDANCE MEASURED VALUE: 72 Ohm
HighPow Impedance: 72 Ohm
Implantable Lead Implant Date: 20160111
Implantable Lead Location: 753858
Implantable Lead Location: 753860
Implantable Lead Model: 181
Implantable Lead Serial Number: 326242
Lead Channel Impedance Value: 530 Ohm
Lead Channel Pacing Threshold Amplitude: 1 V
Lead Channel Pacing Threshold Pulse Width: 0.5 ms
Lead Channel Pacing Threshold Pulse Width: 0.5 ms
Lead Channel Sensing Intrinsic Amplitude: 5 mV
Lead Channel Setting Pacing Amplitude: 2.5 V
Lead Channel Setting Pacing Pulse Width: 0.5 ms
Lead Channel Setting Sensing Sensitivity: 2 mV
MDC IDC LEAD IMPLANT DT: 20160111
MDC IDC LEAD IMPLANT DT: 20160111
MDC IDC LEAD LOCATION: 753859
MDC IDC MSMT BATTERY REMAINING PERCENTAGE: 52 %
MDC IDC MSMT LEADCHNL LV PACING THRESHOLD AMPLITUDE: 1.25 V
MDC IDC MSMT LEADCHNL RA IMPEDANCE VALUE: 440 Ohm
MDC IDC MSMT LEADCHNL RV IMPEDANCE VALUE: 590 Ohm
MDC IDC MSMT LEADCHNL RV SENSING INTR AMPL: 11.4 mV
MDC IDC PG IMPLANT DT: 20160111
MDC IDC SET LEADCHNL LV PACING AMPLITUDE: 2.5 V
MDC IDC SET LEADCHNL RV PACING PULSEWIDTH: 0.5 ms
Pulse Gen Serial Number: 7210309

## 2018-03-08 NOTE — Progress Notes (Signed)
Remote ICD transmission.   

## 2018-03-29 ENCOUNTER — Encounter (HOSPITAL_COMMUNITY): Payer: 59 | Admitting: Cardiology

## 2018-03-30 ENCOUNTER — Ambulatory Visit: Payer: 59 | Admitting: Internal Medicine

## 2018-03-30 ENCOUNTER — Encounter: Payer: Self-pay | Admitting: Internal Medicine

## 2018-03-30 VITALS — BP 142/94 | HR 47 | Ht 72.0 in | Wt 231.6 lb

## 2018-03-30 DIAGNOSIS — Z95810 Presence of automatic (implantable) cardiac defibrillator: Secondary | ICD-10-CM

## 2018-03-30 DIAGNOSIS — I472 Ventricular tachycardia, unspecified: Secondary | ICD-10-CM

## 2018-03-30 DIAGNOSIS — I428 Other cardiomyopathies: Secondary | ICD-10-CM

## 2018-03-30 DIAGNOSIS — I442 Atrioventricular block, complete: Secondary | ICD-10-CM

## 2018-03-30 DIAGNOSIS — I5022 Chronic systolic (congestive) heart failure: Secondary | ICD-10-CM | POA: Diagnosis not present

## 2018-03-30 LAB — CUP PACEART INCLINIC DEVICE CHECK
Implantable Lead Location: 753858
Implantable Lead Location: 753859
Implantable Lead Location: 753860
Implantable Lead Model: 181
MDC IDC LEAD IMPLANT DT: 20160111
MDC IDC LEAD IMPLANT DT: 20160111
MDC IDC LEAD IMPLANT DT: 20160111
MDC IDC LEAD SERIAL: 326242
MDC IDC PG IMPLANT DT: 20160111
MDC IDC SESS DTM: 20200124161138
Pulse Gen Serial Number: 7210309

## 2018-03-30 MED ORDER — CARVEDILOL 12.5 MG PO TABS: 12.5000 mg | ORAL_TABLET | Freq: Two times a day (BID) | ORAL | 3 refills | Status: DC

## 2018-03-30 MED ORDER — RAMIPRIL 2.5 MG PO CAPS: 2.5000 mg | ORAL_CAPSULE | Freq: Every day | ORAL | 3 refills | Status: DC

## 2018-03-30 MED ORDER — AMIODARONE HCL 200 MG PO TABS: 100.0000 mg | ORAL_TABLET | Freq: Every day | ORAL | 3 refills | Status: DC

## 2018-03-30 NOTE — Patient Instructions (Signed)

## 2018-03-30 NOTE — Progress Notes (Signed)
HPI Christopher Patrick returns today for followup. He is a pleasant 53 yo man with a h/o sarcoidosis cardiomyopathy, VT, transient CHB, s/p Biv ICD insertion. He has done well in the interim although he notes that his thyroid has been off. He is currently on 100 mg daily. He feels well otherwise. He is about to retire. 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 Allergies  Allergen Reactions  . Benadryl [Diphenhydramine Hcl (Sleep)] Anaphylaxis  . Diphenhydramine Swelling     Current Outpatient Medications  Medication Sig Dispense Refill  . carvedilol (COREG) 12.5 MG tablet Take 1 tablet (12.5 mg total) by mouth 2 (two) times daily with a meal. 180 tablet 3  . ramipril (ALTACE) 2.5 MG capsule Take 1 capsule (2.5 mg total) by mouth daily. 90 capsule 3  . amiodarone (PACERONE) 200 MG tablet Take 0.5 tablets (100 mg total) by mouth daily. 45 tablet 3   No current facility-administered medications for this visit.      Past Medical History:  Diagnosis Date  . Acute renal insufficiency    a. During adm 03/2014 for VT arrest.  . Cardiac arrest (HCC)    a. VT arrest with recurrent VT 03/2014, s/p CPR, multiple defib. Ultimately felt 2/2 cardiac sarcoid. Placed on amiodarone. Hospitalization complicated by circulatory shock, VDRF, encephalopathy, AKI, and hiccups which improved by discharge.  . Cardiac sarcoidosis   . Complete heart block (HCC)    a. s/p St Jude BiV ICD/pacemaker placement w/ BS leads 03/17/14  . GERD (gastroesophageal reflux disease)   . Non-ischemic cardiomyopathy (HCC)    a. EF15-20% by echo 03/12/14 - s/p BiV-ICD placement. Normal cors 03/2014.  . Pulmonary sarcoidosis (HCC)   . RBBB   . Recurrent ventricular tachycardia (HCC)     ROS:   All systems reviewed and negative except as noted in the HPI.   Past Surgical History:  Procedure Laterality Date  . BI-VENTRICULAR IMPLANTABLE CARDIOVERTER DEFIBRILLATOR N/A 03/17/2014   Procedure: BI-VENTRICULAR  IMPLANTABLE CARDIOVERTER DEFIBRILLATOR  (CRT-D);  Surgeon: Marinus Maw, MD;  Location: [facility] CATH LAB;  Service: Cardiovascular;  Laterality: N/A;  . LEFT HEART CATHETERIZATION WITH CORONARY ANGIOGRAM N/A 03/12/2014   Procedure: LEFT HEART CATHETERIZATION WITH CORONARY ANGIOGRAM;  Surgeon: Iran Ouch, MD;  Location: [facility] CATH LAB;  Service: Cardiovascular;  Laterality: N/A;     Family History  Problem Relation Age of Onset  . Stroke Father   . Hypertension Maternal Grandfather   . Hypertension Maternal Grandmother      Social History   Socioeconomic History  . Marital status: Married    Spouse name: Not on file  . Number of children: Not on file  . Years of education: Not on file  . Highest education level: Not on file  Occupational History  . Occupation: Emergency planning/management officer  Social Needs  . Financial resource strain: Not on file  . Food insecurity:    Worry: Not on file    Inability: Not on file  . Transportation needs:    Medical: Not on file    Non-medical: Not on file  Tobacco Use  . Smoking status: Passive Smoke Exposure - Never Smoker  . Smokeless tobacco: Never Used  . Tobacco comment: second hand as child  Substance and Sexual Activity  . Alcohol use: No    Alcohol/week: 0.0 standard drinks  . Drug use: No  . Sexual activity: Not on file  Lifestyle  . Physical activity:  Days per week: Not on file    Minutes per session: Not on file  . Stress: Not on file  Relationships  . Social connections:    Talks on phone: Not on file    Gets together: Not on file    Attends religious service: Not on file    Active member of club or organization: Not on file    Attends meetings of clubs or organizations: Not on file    Relationship status: Not on file  . Intimate partner violence:    Fear of current or ex partner: Not on file    Emotionally abused: Not on file    Physically abused: Not on file    Forced sexual activity: Not on file  Other Topics Concern  . Not  on file  Social History Narrative  . Not on file     BP (!) 142/94   Pulse (!) 47   Ht 6' (1.829 m)   Wt 231 lb 9.6 oz (105.1 kg)   SpO2 99%   BMI 31.41 kg/m   Physical Exam:  Well appearing NAD HEENT: Unremarkable Neck:  No JVD, no thyromegally Lymphatics:  No adenopathy Back:  No CVA tenderness Lungs:  Clear HEART:  Regular rate rhythm, no murmurs, no rubs, no clicks Abd:  soft, positive bowel sounds, no organomegally, no rebound, no guarding Ext:  2 plus pulses, no edema, no cyanosis, no clubbing Skin:  No rashes no nodules Neuro:  CN II through XII intact, motor grossly intact  EKG - sinus brady with NSSTT abnormality  DEVICE  Normal device function.  See PaceArt for details.   Assess/Plan: 1. Sinus bradycardia - he is asymptomatic. 2. VT - he has had none on low dose amiodarone.  3. ICD - His St. Jude biv is programmed VVI  4. Sarcoid cardiomyopathy - he will undergo watchful waiting.   Christopher Patrick.D.

## 2018-04-10 ENCOUNTER — Other Ambulatory Visit (HOSPITAL_COMMUNITY): Payer: Self-pay | Admitting: Cardiology

## 2018-04-10 ENCOUNTER — Other Ambulatory Visit: Payer: Self-pay | Admitting: Internal Medicine

## 2018-04-10 DIAGNOSIS — I472 Ventricular tachycardia, unspecified: Secondary | ICD-10-CM

## 2018-04-10 DIAGNOSIS — I428 Other cardiomyopathies: Secondary | ICD-10-CM

## 2018-04-10 NOTE — Telephone Encounter (Signed)
Outpatient Medication Detail    Disp Refills Start End   ramipril (ALTACE) 2.5 MG capsule 90 capsule 3 03/30/2018    Sig - Route: Take 1 capsule (2.5 mg total) by mouth daily. - Oral   Sent to pharmacy as: ramipril (ALTACE) 2.5 MG capsule   E-Prescribing Status: Receipt confirmed by pharmacy (03/30/2018 4:18 PM EST)   Associated Diagnoses   Non-ischemic cardiomyopathy - EF ~15-20% by Echo     Pharmacy   [facility] - [place], [place] - [address]

## 2018-05-25 ENCOUNTER — Encounter (HOSPITAL_COMMUNITY): Payer: 59 | Admitting: Cardiology

## 2018-06-05 ENCOUNTER — Other Ambulatory Visit: Payer: Self-pay

## 2018-06-05 ENCOUNTER — Ambulatory Visit (INDEPENDENT_AMBULATORY_CARE_PROVIDER_SITE_OTHER): Payer: 59 | Admitting: *Deleted

## 2018-06-05 DIAGNOSIS — I472 Ventricular tachycardia, unspecified: Secondary | ICD-10-CM

## 2018-06-05 LAB — CUP PACEART REMOTE DEVICE CHECK
Battery Remaining Percentage: 50 %
Battery Voltage: 2.95 V
Date Time Interrogation Session: 20200331060017
HighPow Impedance: 75 Ohm
HighPow Impedance: 75 Ohm
Implantable Lead Implant Date: 20160111
Implantable Lead Implant Date: 20160111
Implantable Lead Location: 753858
Implantable Lead Location: 753859
Implantable Lead Location: 753860
Implantable Lead Model: 181
Implantable Lead Serial Number: 326242
Implantable Pulse Generator Implant Date: 20160111
Lead Channel Impedance Value: 460 Ohm
Lead Channel Impedance Value: 600 Ohm
Lead Channel Pacing Threshold Amplitude: 1.25 V
Lead Channel Pacing Threshold Pulse Width: 0.5 ms
Lead Channel Sensing Intrinsic Amplitude: 5 mV
Lead Channel Setting Pacing Amplitude: 2.5 V
Lead Channel Setting Pacing Amplitude: 2.5 V
Lead Channel Setting Pacing Pulse Width: 0.5 ms
Lead Channel Setting Pacing Pulse Width: 0.5 ms
Lead Channel Setting Sensing Sensitivity: 2 mV
MDC IDC LEAD IMPLANT DT: 20160111
MDC IDC MSMT BATTERY REMAINING LONGEVITY: 56 mo
MDC IDC MSMT LEADCHNL LV IMPEDANCE VALUE: 590 Ohm
MDC IDC MSMT LEADCHNL RV PACING THRESHOLD AMPLITUDE: 0.75 V
MDC IDC MSMT LEADCHNL RV PACING THRESHOLD PULSEWIDTH: 0.5 ms
MDC IDC MSMT LEADCHNL RV SENSING INTR AMPL: 11.4 mV
Pulse Gen Serial Number: 7210309

## 2018-06-12 ENCOUNTER — Encounter: Payer: Self-pay | Admitting: Cardiology

## 2018-06-12 NOTE — Progress Notes (Signed)
Remote ICD transmission.   

## 2018-07-03 ENCOUNTER — Other Ambulatory Visit: Payer: Self-pay | Admitting: Internal Medicine

## 2018-07-03 DIAGNOSIS — I428 Other cardiomyopathies: Secondary | ICD-10-CM

## 2018-07-03 MED ORDER — RAMIPRIL 2.5 MG PO CAPS: 2.5000 mg | ORAL_CAPSULE | Freq: Every day | ORAL | 2 refills | Status: DC

## 2018-07-03 MED ORDER — CARVEDILOL 12.5 MG PO TABS: 12.5000 mg | ORAL_TABLET | Freq: Two times a day (BID) | ORAL | 2 refills | Status: DC

## 2018-07-05 ENCOUNTER — Telehealth (HOSPITAL_COMMUNITY): Payer: Self-pay

## 2018-07-05 DIAGNOSIS — I472 Ventricular tachycardia, unspecified: Secondary | ICD-10-CM

## 2018-07-05 MED ORDER — AMIODARONE HCL 200 MG PO TABS: 100.0000 mg | ORAL_TABLET | Freq: Every day | ORAL | 0 refills | Status: DC

## 2018-07-05 NOTE — Telephone Encounter (Signed)
Pt called asking for medicines to be forwarded to [facility] for 90 supply. Pt has f/u scheduled for next month. Advised to keep appointment to continue to receive refills. Pt verbalized understanding and appreciative.

## 2018-07-27 ENCOUNTER — Encounter (HOSPITAL_COMMUNITY): Payer: Self-pay | Admitting: Cardiology

## 2018-07-27 ENCOUNTER — Other Ambulatory Visit: Payer: Self-pay

## 2018-07-27 ENCOUNTER — Ambulatory Visit (HOSPITAL_COMMUNITY)
Admission: RE | Admit: 2018-07-27 | Discharge: 2018-07-27 | Disposition: A | Discharge status: Home / Self Care | Payer: 59 | Source: Ambulatory Visit | Attending: Cardiology | Admitting: Cardiology

## 2018-07-27 DIAGNOSIS — D860 Sarcoidosis of lung: Secondary | ICD-10-CM

## 2018-07-27 DIAGNOSIS — I472 Ventricular tachycardia, unspecified: Secondary | ICD-10-CM

## 2018-07-27 DIAGNOSIS — I428 Other cardiomyopathies: Secondary | ICD-10-CM

## 2018-07-27 DIAGNOSIS — D8685 Sarcoid myocarditis: Secondary | ICD-10-CM | POA: Diagnosis not present

## 2018-07-27 NOTE — Progress Notes (Signed)
Orders placed per Dr Shirlee Latch, AVS mailed to pt, message sent to schedulers to arrange labs

## 2018-07-27 NOTE — Patient Instructions (Signed)
Your physician recommends that you return for a FASTING lipid profile: in 1-2 weeks, our office will call you to schedule this  You have been referred to Dr Craige Cotta at [facility] they will call you to schedule this appointment  We will contact you in 1 year to schedule your next appointment.

## 2018-07-30 NOTE — Progress Notes (Signed)
Heart Failure TeleHealth Note  Due to national recommendations of social distancing due to COVID 19, Audio/video telehealth visit is felt to be most appropriate for this patient at this time.  See MyChart message from today for patient consent regarding telehealth [facility].  Date:  07/30/2018   ID:  Christopher BoastMichael M Patrick, DOB 10-May-1965, MRN [id number]  Location: Home  Provider location: [place] [facility] Type of Visit: Established patient  PCP:  Loyal JacobsonKalish, Staci, MD  Cardiologist:  Dr. Shirlee LatchMcLean  Chief Complaint: Fatigue   History of Present Illness: Christopher BoastMichael M Estala is a 53 y.o. male who presents via audio/video conferencing for a telehealth visit today.     he denies symptoms worrisome for COVID 19.   Patient has a history of pulmonary sarcoidosis.  [facility]16 after a VT arrest with 20 minutes CPR and defibrillation x 3.  He had therapeutic hypothermia.  He had good neurologic recovery.  He developed VT storm and ended up getting St Jude BiV ICD and starting amiodarone.  LHC showed no significant coronary disease.  Echo initially after arrest showed EF 15-20%, but cardiac MRI a week or so later showed EF 46% with LGE pattern concerning for cardiac sarcoidosis.  Patient also had complete heart block (treated with St Jude BiV ICD).  The cause of VT and CHB was thought to be cardiac sarcoidosis.  He was started on prednisone 60 mg daily and is following with pulmonary (Dr Craige CottaSood).    He was readmitted in 2/16 with presyncope. After extensive evaluation, this was thought to be vagal in origin most likely.  No evidence for arrhythmia by device interrogation.  Echo showed EF improved back to 55-60% with mild RV dilation.  CTA chest showed no PE. Ramipril was decreased to 2.5 mg daily.   Last echo in 9/18 showed EF 60-65%, mild LVH, normal RV.    Patient retired 3 months ago.  He has been doing well symptomatically, no exertional dyspnea or  chest pain.  No palpitations.  No syncope/presyncope.  No edema.    Labs (1/16): K 4.1, creatinine 1.18, LFTs normal, HCT 42.5, plts 91K Labs (2/16): LFTs normal, K 3.8, creatinine 1.19, TSH normal, LDL 129 Labs (11/16): LFTs normal, TSH normal Labs (2/17): LFTs normal, TSH normal, K 4, creatinine 1.21 Labs (6/17): LDL 101, LFTs normal, K 4.6, creatinine 1.39 Labs (7/17): TSH normal, free T3 normal Labs (1/19): TSH and free T4 elevated, T3 normal, creatinine 1.46  PMH: 1. Pulmonary sarcoidosis: Diagnosed around 2011.  PFTs (2/16) with minimal abnormality.  2. 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   - Echo (1/16) with EF 15-20%, diffuse hypokinesis, RV moderately dilated with severe decreased systolic function (initially post-cardiac arrest).  - Cardiac MRI (1/16) with EF 46%, basal to mid anteroseptal and apical inferior hypokinesis, RV moderately dilated with mild to moderately decreased systolic function, RV EF 33%, patchy LGE in a pattern concerning for cardiac sarcoidosis.  - Echo (2/16) with EF 55-60%, mild RV dilation.  - Echo (2/17) with EF 50-55%, mildly 3. Ventricular tachycardia: Admission 1/16 with cardiac arrest and VT storm. Suspect due to cardiac sarcoidosis.  - LHC (1/16) with normal coronaries.  - St Jude BiV ICD placed, amiodarone started.  - Echo (9/18): EF 60-65%, mild LVH, normal RV size and systolic function.  4. Complete heart block: Likely due to cardiac sarcoidosis.  St Jude BiV ICD placed.  5. GERD 6.  Pre-syncope: 2/16, possibly vagal event.   Current Outpatient Medications  Medication Sig Dispense Refill  . amiodarone (PACERONE) 200 MG tablet Take 0.5 tablets (100 mg total) by mouth daily. 45 tablet 0  . carvedilol (COREG) 12.5 MG tablet Take 1 tablet (12.5 mg total) by mouth 2 (two) times daily with a meal. 180 tablet 2  . ramipril (ALTACE) 2.5 MG capsule Take 1 capsule (2.5 mg total) by mouth daily. 90 capsule 2   No current  facility-administered medications for this encounter.     Allergies:   Benadryl [diphenhydramine hcl (sleep)] and Diphenhydramine   Social History:  The patient  reports that he is a non-smoker but has been exposed to tobacco smoke. He has never used smokeless tobacco. He reports that he does not drink alcohol or use drugs.   Family History:  The patient's family history includes Hypertension in his maternal grandfather and maternal grandmother; Stroke in his father.   ROS:  Please see the history of present illness.   All other systems are personally reviewed and negative.   Exam:  (Video/Tele Health Call; Exam is subjective and or/visual.) General:  Speaks in full sentences. No resp difficulty. Neck: No JVD.  Lungs: Normal respiratory effort with conversation.  Abdomen: Non-distended per patient report Extremities: Pt denies edema. Neuro: Alert & oriented x 3.   Recent Labs: No results found for requested labs within last 8760 hours.  Personally reviewed   Wt Readings from Last 3 Encounters:  03/30/18 105.1 kg (231 lb 9.6 oz)  03/17/17 102.2 kg (225 lb 3.2 oz)  11/04/16 95.9 kg (211 lb 8 oz)      ASSESSMENT AND PLAN:  1. Cardiac sarcoidosis: I suspect this is the etiology of cardiomyopathy, VT, and complete heart block.  Echo 2/17 with EF 50-55% and echo in 9/18 with EF up to 60-65%.  Pulmonary sarcoidosis has been quiescent and he is off prednisone. He may have had some conduction disease with prednisone as conduction system appears improved (BiV device has been set to VVI backup). 2. Chronic systolic CHF: Initial EF 15-20% in hospital, suspect this was due to stunning from cardiac arrest.  Cardiac MRI a week or so later showed LV EF 46% with moderate RV systolic dysfunction. EF back to 60-65% on most recent echo in 9/18. He looks euvolemic on exam today.  NYHA class I-II symptoms.  - Continue ramipril and will continue Coreg to 12.5 mg bid given VT history.   3. VT: Has ICD, on  amiodarone.   - Continue amiodarone 100 mg daily.  He will need a regular eye exam. Check LFTs and TSH/free T3/free T4. He will need PFTs through pulmonary (follows with Dr. Craige Cotta).   4. Complete heart block: St Jude CRT. Given improved cardiac conduction, perhaps with steroid treatment for sarcoidosis, device now set to VVI backup.  5. Pulmonary sarcoidosis: He needs to arrange followup with Dr. Craige Cotta.   COVID screen The patient does not have any symptoms that suggest any further testing/ screening at this time.  Social distancing reinforced today.  Patient Risk: After full review of this patients clinical status, I feel that they are at moderate risk for cardiac decompensation at this time.  Relevant cardiac medications were reviewed at length with the patient today. The patient does not have concerns regarding their medications at this time.   Recommended follow-up:  1 year (he will see Dr. Graciela Husbands in 6 months).  He needs to make sure to get LFTs and TFTs  every 3 months with amiodarone.   Today, I have spent 16 minutes with the patient with telehealth technology discussing the above issues .    Signed, Marca Ancona, MD  07/30/2018  [facility] [address] [facility] [place] [address] [phone] (office) [phone] (fax)

## 2018-07-31 ENCOUNTER — Telehealth (HOSPITAL_COMMUNITY): Payer: Self-pay | Admitting: Cardiology

## 2018-07-31 NOTE — Telephone Encounter (Signed)
Left message for patient to call me back.  Need to give him appt for labs with [place] on 08/08/2018 and appt with Rubye Oaks, NP @ [facility] on 08/06/2018.

## 2018-08-01 NOTE — Telephone Encounter (Signed)
Patient returned my call on 07/31/2018 and is aware of lab appt and appt with Rubye Oaks, NP at [facility].  Pt was also given phone number for Pulmonary and advised that office has moved from [facility], pt voiced understanding.

## 2018-08-06 ENCOUNTER — Ambulatory Visit: Payer: 59 | Admitting: Adult Health

## 2018-08-06 ENCOUNTER — Other Ambulatory Visit: Payer: Self-pay

## 2018-08-06 ENCOUNTER — Encounter: Payer: Self-pay | Admitting: Adult Health

## 2018-08-06 ENCOUNTER — Ambulatory Visit (INDEPENDENT_AMBULATORY_CARE_PROVIDER_SITE_OTHER): Payer: 59

## 2018-08-06 VITALS — BP 138/80 | HR 58 | Temp 97.9°F | Ht 72.0 in | Wt 231.0 lb

## 2018-08-06 DIAGNOSIS — D869 Sarcoidosis, unspecified: Secondary | ICD-10-CM

## 2018-08-06 DIAGNOSIS — D860 Sarcoidosis of lung: Secondary | ICD-10-CM | POA: Diagnosis not present

## 2018-08-06 NOTE — Progress Notes (Signed)
@Patient  ID: Christopher Patrick, male    DOB: 1965-07-14, 53 y.o.   MRN: [id number]  Chief Complaint  Patient presents with  . Follow-up    Sarcoid     Referring provider: Loyal Jacobson, MD  HPI: 53 year old male never smoker smoker followed for pulmonary and cardiac sarcoidosis Status post ICD from V. tach storm and cardiac arrest January 2016  TEST/EVENTS :  CT chest 04/21/14 >> 8 mm LLL nodule, 3 mm nodule RUL, 3 mm nodule RUL, 4 mm nodule RUL, 6 mm nodule RUL, 5 mm nodule lingula PFT 05/01/2014 >> FEV1 3.63 (90%), FEV1% 75, TLC 6.27 (87%), DLCO 75%, no BD  Cardiac tests Cardiac MRI 03/17/14 >> concerning for cardiac sarcoidosis Echo 04/27/15 >> mild LVH, EF 50 to 55%, grade 1 diastolic CHF  Past medical hx GERD, VT, Heart block s/p ICD/PM, non ischemic CM from sarcoidosis, RBBB  08/06/2018 Follow up : Sarcoid  Patient returns for a follow-up for sarcoid.  Patient was last seen in 2017 Says overall he has been doing very well.  He is active denies any issues with dyspnea or cough. As above he had significant pulmonary and cardiac sarcoidosis.  Previously treated with steroids and weaned off in 2017. CT chest August 2017 showed small pulmonary nodules stable to improved PFT in 2016 showed stable lung function. Patient is followed by cardiology, echo stable w/ EF 60-65%.  He remains on ramipril, Coreg, amiodarone. Denies any syncope, chest pain orthopnea or edema.    Allergies  Allergen Reactions  . Benadryl [Diphenhydramine Hcl (Sleep)] Anaphylaxis  . Diphenhydramine Swelling    Immunization History  Administered Date(s) Administered  . Influenza Split 10/05/2013  . Influenza,inj,Quad PF,6+ Mos 02/05/2015  . Pneumococcal Polysaccharide-23 10/05/2012    Past Medical History:  Diagnosis Date  . Acute renal insufficiency    a. During adm 03/2014 for VT arrest.  . Cardiac arrest (HCC)    a. VT arrest with recurrent VT 03/2014, s/p CPR, multiple defib. Ultimately  felt 2/2 cardiac sarcoid. Placed on amiodarone. Hospitalization complicated by circulatory shock, VDRF, encephalopathy, AKI, and hiccups which improved by discharge.  . Cardiac sarcoidosis   . Complete heart block (HCC)    a. s/p St Jude BiV ICD/pacemaker placement w/ BS leads 03/17/14  . GERD (gastroesophageal reflux disease)   . Non-ischemic cardiomyopathy (HCC)    a. EF15-20% by echo 03/12/14 - s/p BiV-ICD placement. Normal cors 03/2014.  . Pulmonary sarcoidosis (HCC)   . RBBB   . Recurrent ventricular tachycardia (HCC)     Tobacco History: Social History   Tobacco Use  Smoking Status Passive Smoke Exposure - Never Smoker  Smokeless Tobacco Never Used  Tobacco Comment   second hand as child   Counseling given: Not Answered Comment: second hand as child   Outpatient Medications Prior to Visit  Medication Sig Dispense Refill  . amiodarone (PACERONE) 200 MG tablet Take 0.5 tablets (100 mg total) by mouth daily. 45 tablet 0  . carvedilol (COREG) 12.5 MG tablet Take 1 tablet (12.5 mg total) by mouth 2 (two) times daily with a meal. 180 tablet 2  . ramipril (ALTACE) 2.5 MG capsule Take 1 capsule (2.5 mg total) by mouth daily. 90 capsule 2   No facility-administered medications prior to visit.      Review of Systems:   Constitutional:   No  weight loss, night sweats,  Fevers, chills, + fatigue, or  lassitude.  HEENT:   No headaches,  Difficulty swallowing,  Tooth/dental problems,  or  Sore throat,                No sneezing, itching, ear ache, nasal congestion, post nasal drip,   CV:  No chest pain,  Orthopnea, PND, swelling in lower extremities, anasarca, dizziness, palpitations, syncope.   GI  No heartburn, indigestion, abdominal pain, nausea, vomiting, diarrhea, change in bowel habits, loss of appetite, bloody stools.   Resp: No shortness of breath with exertion or at rest.  No excess mucus, no productive cough,  No non-productive cough,  No coughing up of blood.  No change  in color of mucus.  No wheezing.  No chest wall deformity  Skin: no rash or lesions.  GU: no dysuria, change in color of urine, no urgency or frequency.  No flank pain, no hematuria   MS:  No joint pain or swelling.  No decreased range of motion.  No back pain.    Physical Exam   GEN: A/Ox3; pleasant , NAD, well nourished    HEENT:  [place]/AT,  EACs-clear, TMs-wnl, NOSE-clear, THROAT-clear, no lesions, no postnasal drip or exudate noted.   NECK:  Supple w/ fair ROM; no JVD; normal carotid impulses w/o bruits; no thyromegaly or nodules palpated; no lymphadenopathy.    RESP  Clear  P & A; w/o, wheezes/ rales/ or rhonchi. no accessory muscle use, no dullness to percussion  CARD:  RRR, no m/r/g, no peripheral edema, pulses intact, no cyanosis or clubbing.  GI:   Soft & nt; nml bowel sounds; no organomegaly or masses detected.   Musco: Warm bil, no deformities or joint swelling noted.   Neuro: alert, no focal deficits noted.    Skin: Warm, no lesions or rashes    Lab Results:  CBC  BNP No results found for: BNP  ProBNP No results found for: PROBNP  Imaging: No results found.    PFT Results Latest Ref Rng & Units 05/01/2014  FVC-Pre L 4.94  FVC-Predicted Pre % 95  FVC-Post L 4.87  FVC-Predicted Post % 93  Pre FEV1/FVC % % 72  Post FEV1/FCV % % 75  FEV1-Pre L 3.56  FEV1-Predicted Pre % 88  FEV1-Post L 3.63  DLCO UNC% % 75  DLCO COR %Predicted % 85  TLC L 6.27  TLC % Predicted % 87  RV % Predicted % 66    No results found for: NITRICOXIDE      Assessment & Plan:   No problem-specific Assessment & Plan notes found for this encounter.     Rubye Oaks, NP 08/06/2018

## 2018-08-06 NOTE — Patient Instructions (Addendum)
Activity as tolerated.  Chest xray today  Follow up with Dr. Craige Cotta  In 1 year and As needed

## 2018-08-07 ENCOUNTER — Encounter: Payer: Self-pay | Admitting: Adult Health

## 2018-08-07 NOTE — Progress Notes (Signed)
Called spoke with patient, advised of cxr results / recs as stated by TP.  Pt verbalized understanding and denied any questions.

## 2018-08-07 NOTE — Assessment & Plan Note (Signed)
Pulmonary sarcoidosis-along with cardiac sarcoidosis treated with aggressive steroids in 2016 weaned off of steroids in 2017.  Patient has done clinically very well.  Follow-up CT chest in August 2017 showed stable to improved small pulmonary nodules and adenopathy.  Patient denies any ongoing symptoms with cough or shortness of breath.  Remains active.  PFTs were stable in 2016. Patient will need follow-up PFTs once COVID precautions are lifted will set these up.  Check chest x-ray today  Plan  Patient Instructions  Activity as tolerated.  Chest xray today  Follow up with Dr. Craige Cotta  In 1 year and As needed

## 2018-08-08 ENCOUNTER — Ambulatory Visit (HOSPITAL_COMMUNITY)
Admission: RE | Admit: 2018-08-08 | Discharge: 2018-08-08 | Disposition: A | Discharge status: Home / Self Care | Payer: 59 | Source: Ambulatory Visit | Attending: Internal Medicine | Admitting: Internal Medicine

## 2018-08-08 ENCOUNTER — Other Ambulatory Visit: Payer: Self-pay

## 2018-08-08 DIAGNOSIS — I472 Ventricular tachycardia, unspecified: Secondary | ICD-10-CM

## 2018-08-08 DIAGNOSIS — I428 Other cardiomyopathies: Secondary | ICD-10-CM | POA: Diagnosis present

## 2018-08-08 LAB — CBC
HCT: 43.6 % (ref 39.0–52.0)
Hemoglobin: 14.8 g/dL (ref 13.0–17.0)
MCH: 32.3 pg (ref 26.0–34.0)
MCHC: 33.9 g/dL (ref 30.0–36.0)
MCV: 95.2 fL (ref 80.0–100.0)
Platelets: 152 10*3/uL (ref 150–400)
RBC: 4.58 MIL/uL (ref 4.22–5.81)
RDW: 12.2 % (ref 11.5–15.5)
WBC: 5.7 10*3/uL (ref 4.0–10.5)
nRBC: 0 % (ref 0.0–0.2)

## 2018-08-08 LAB — LIPID PANEL
Cholesterol: 167 mg/dL (ref 0–200)
HDL: 52 mg/dL (ref >40)
LDL Cholesterol: 105 mg/dL — ABNORMAL HIGH (ref 0–99)
Total CHOL/HDL Ratio: 3.2 RATIO
Triglycerides: 52 mg/dL (ref <150)
VLDL: 10 mg/dL (ref 0–40)

## 2018-08-08 LAB — COMPREHENSIVE METABOLIC PANEL
ALT: 18 U/L (ref 0–44)
AST: 22 U/L (ref 15–41)
Albumin: 3.9 g/dL (ref 3.5–5.0)
Alkaline Phosphatase: 51 U/L (ref 38–126)
Anion gap: 5 (ref 5–15)
BUN: 15 mg/dL (ref 6–20)
CO2: 26 mmol/L (ref 22–32)
Calcium: 8.7 mg/dL — ABNORMAL LOW (ref 8.9–10.3)
Chloride: 107 mmol/L (ref 98–111)
Creatinine, Ser: 1.09 mg/dL (ref 0.61–1.24)
GFR calc Af Amer: >60 mL/min (ref >60)
GFR calc non Af Amer: >60 mL/min (ref >60)
Glucose, Bld: 98 mg/dL (ref 70–99)
Potassium: 4 mmol/L (ref 3.5–5.1)
Sodium: 138 mmol/L (ref 135–145)
Total Bilirubin: 0.8 mg/dL (ref 0.3–1.2)
Total Protein: 5.8 g/dL — ABNORMAL LOW (ref 6.5–8.1)

## 2018-08-08 LAB — T4, FREE: Free T4: 1.04 ng/dL (ref 0.82–1.77)

## 2018-08-08 LAB — TSH: TSH: 11.968 u[IU]/mL — ABNORMAL HIGH (ref 0.350–4.500)

## 2018-08-09 ENCOUNTER — Telehealth (HOSPITAL_COMMUNITY): Payer: Self-pay

## 2018-08-09 LAB — T3, FREE: T3, Free: 2.8 pg/mL (ref 2.0–4.4)

## 2018-08-09 NOTE — Telephone Encounter (Signed)
He has an ICD, think he can try coming off amiodarone.  Will forward this to Dr. Ladona Ridgel to make sure he does not object.

## 2018-08-09 NOTE — Telephone Encounter (Signed)
Pt given results of blood work indicating hypothyroidism.  Pt aware that amiodarone can affect thyroid function and is inquiring about possibility of discontinuing amiodarone to correct thyroid level. Pt I son Amiodarone 100mg  dialy. Please advise    Routed to Dr. Shirlee Latch

## 2018-08-09 NOTE — Telephone Encounter (Signed)
-----   Message from Laurey Morale, MD sent at 08/09/2018 10:53 AM EDT ----- TSH higher, subclinical hypothyroidism.  Would recommend Levoxyl 25 mcg daily and repeat TSH in 1 month.

## 2018-08-09 NOTE — Telephone Encounter (Signed)
LVM to call back.

## 2018-08-09 NOTE — Telephone Encounter (Signed)
-----   Message from Dalton S McLean, MD sent at 08/09/2018 10:53 AM EDT ----- TSH higher, subclinical hypothyroidism.  Would recommend Levoxyl 25 mcg daily and repeat TSH in 1 month. 

## 2018-08-09 NOTE — Telephone Encounter (Signed)
Pt aware that he can try coming off amio and we will wait to hear back from Dr Ladona Ridgel.  Pt appreciative

## 2018-08-12 ENCOUNTER — Other Ambulatory Visit (HOSPITAL_COMMUNITY): Payer: Self-pay | Admitting: Cardiology

## 2018-08-12 DIAGNOSIS — I472 Ventricular tachycardia, unspecified: Secondary | ICD-10-CM

## 2018-08-19 NOTE — Telephone Encounter (Signed)
Ok to stop amio. GT

## 2018-09-04 ENCOUNTER — Ambulatory Visit (INDEPENDENT_AMBULATORY_CARE_PROVIDER_SITE_OTHER): Payer: 59 | Admitting: *Deleted

## 2018-09-04 DIAGNOSIS — I428 Other cardiomyopathies: Secondary | ICD-10-CM

## 2018-09-04 LAB — CUP PACEART REMOTE DEVICE CHECK
Battery Remaining Longevity: 53 mo
Battery Remaining Percentage: 48 %
Battery Voltage: 2.93 V
Date Time Interrogation Session: 20200630060008
HighPow Impedance: 75 Ohm
HighPow Impedance: 75 Ohm
Implantable Lead Implant Date: 20160111
Implantable Lead Implant Date: 20160111
Implantable Lead Implant Date: 20160111
Implantable Lead Location: 753858
Implantable Lead Location: 753859
Implantable Lead Location: 753860
Implantable Lead Model: 181
Implantable Lead Serial Number: 326242
Implantable Pulse Generator Implant Date: 20160111
Lead Channel Impedance Value: 460 Ohm
Lead Channel Impedance Value: 510 Ohm
Lead Channel Impedance Value: 630 Ohm
Lead Channel Pacing Threshold Amplitude: 0.75 V
Lead Channel Pacing Threshold Amplitude: 1.25 V
Lead Channel Pacing Threshold Pulse Width: 0.5 ms
Lead Channel Pacing Threshold Pulse Width: 0.5 ms
Lead Channel Sensing Intrinsic Amplitude: 11.4 mV
Lead Channel Sensing Intrinsic Amplitude: 5 mV
Lead Channel Setting Pacing Amplitude: 2.5 V
Lead Channel Setting Pacing Amplitude: 2.5 V
Lead Channel Setting Pacing Pulse Width: 0.5 ms
Lead Channel Setting Pacing Pulse Width: 0.5 ms
Lead Channel Setting Sensing Sensitivity: 2 mV
Pulse Gen Serial Number: 7210309

## 2018-09-15 ENCOUNTER — Encounter: Payer: Self-pay | Admitting: Cardiology

## 2018-09-15 NOTE — Progress Notes (Signed)
Remote ICD transmission.   

## 2018-12-04 ENCOUNTER — Ambulatory Visit (INDEPENDENT_AMBULATORY_CARE_PROVIDER_SITE_OTHER): Payer: 59 | Admitting: *Deleted

## 2018-12-04 DIAGNOSIS — I428 Other cardiomyopathies: Secondary | ICD-10-CM

## 2018-12-04 DIAGNOSIS — I4510 Unspecified right bundle-branch block: Secondary | ICD-10-CM

## 2018-12-04 LAB — CUP PACEART REMOTE DEVICE CHECK
Battery Remaining Longevity: 50 mo
Battery Remaining Percentage: 45 %
Battery Voltage: 2.93 V
Date Time Interrogation Session: 20200929060015
HighPow Impedance: 68 Ohm
HighPow Impedance: 68 Ohm
Implantable Lead Implant Date: 20160111
Implantable Lead Implant Date: 20160111
Implantable Lead Implant Date: 20160111
Implantable Lead Location: 753858
Implantable Lead Location: 753859
Implantable Lead Location: 753860
Implantable Lead Model: 181
Implantable Lead Serial Number: 326242
Implantable Pulse Generator Implant Date: 20160111
Lead Channel Impedance Value: 430 Ohm
Lead Channel Impedance Value: 490 Ohm
Lead Channel Impedance Value: 590 Ohm
Lead Channel Pacing Threshold Amplitude: 0.75 V
Lead Channel Pacing Threshold Amplitude: 1.25 V
Lead Channel Pacing Threshold Pulse Width: 0.5 ms
Lead Channel Pacing Threshold Pulse Width: 0.5 ms
Lead Channel Sensing Intrinsic Amplitude: 11.4 mV
Lead Channel Sensing Intrinsic Amplitude: 5 mV
Lead Channel Setting Pacing Amplitude: 2.5 V
Lead Channel Setting Pacing Amplitude: 2.5 V
Lead Channel Setting Pacing Pulse Width: 0.5 ms
Lead Channel Setting Pacing Pulse Width: 0.5 ms
Lead Channel Setting Sensing Sensitivity: 2 mV
Pulse Gen Serial Number: 7210309

## 2018-12-13 NOTE — Progress Notes (Signed)
Remote ICD transmission.   

## 2018-12-17 ENCOUNTER — Telehealth: Payer: Self-pay | Admitting: Emergency Medicine

## 2018-12-17 NOTE — Telephone Encounter (Signed)
Patient reports he was awake during event that occurred 12/16/18 @ 0621 but had no CP, SOB, or syncope. He reports he felt "different".  Had VR of 142 in VT-1 zone that was treated successfully with 1 round of ATP. Amiodorone was d/ced in June. [place] DMV driving restrictions reviewed with patient due to treatment by ICD. Shock plan reviewed.

## 2018-12-26 NOTE — Telephone Encounter (Signed)
VT noted. No additional treatment at this point.

## 2019-01-02 ENCOUNTER — Telehealth: Payer: Self-pay | Admitting: *Deleted

## 2019-01-02 NOTE — Telephone Encounter (Signed)
See note from Dr. Lovena Le.

## 2019-01-02 NOTE — Telephone Encounter (Signed)
Continue current treatment. GT 

## 2019-01-02 NOTE — Telephone Encounter (Signed)
Merlin alert received for VT-1 zone episode terminated with ATP x1 on 01/01/19 at 15:11. 14 NSVT episodes since 12/24/18, 4-16sec duration in VT-1 zone. Presenting rhythm as of 01/01/19 at 17:53 shows AS/VS 60s with frequent PVCs.  Spoke with patient. He denies symptoms with episode. Advised of [place] DMV driving restrictions x6 months, patient verbalizes understanding. Confirmed that he stopped amiodarone in 08/2018 due to hypothyroidism as instructed by Dr. Aundra Dubin. Pt expressed concerns about increasing frequency of VT episodes (additional treated episode on 12/16/18), asked if he needs to come in for follow-up to discuss a different medication. Advised I will forward message to Dr. Lovena Le and Dr. Aundra Dubin for recommendations. He is in agreement with plan, no further questions at this time.

## 2019-01-03 ENCOUNTER — Other Ambulatory Visit: Payer: Self-pay | Admitting: Internal Medicine

## 2019-01-03 DIAGNOSIS — I428 Other cardiomyopathies: Secondary | ICD-10-CM

## 2019-01-03 NOTE — Telephone Encounter (Signed)
Spoke with patient. Advised of recommendations. Pt verbalizes understanding. Scheduled for f/u with Dr. Lovena Le per recall on 04/02/19. All questions answered.

## 2019-03-05 ENCOUNTER — Ambulatory Visit (INDEPENDENT_AMBULATORY_CARE_PROVIDER_SITE_OTHER): Payer: 59 | Admitting: *Deleted

## 2019-03-05 DIAGNOSIS — I428 Other cardiomyopathies: Secondary | ICD-10-CM | POA: Diagnosis not present

## 2019-03-06 LAB — CUP PACEART REMOTE DEVICE CHECK
Battery Remaining Longevity: 48 mo
Battery Remaining Percentage: 44 %
Battery Voltage: 2.93 V
Date Time Interrogation Session: 20201229171414
HighPow Impedance: 72 Ohm
HighPow Impedance: 72 Ohm
Implantable Lead Implant Date: 20160111
Implantable Lead Implant Date: 20160111
Implantable Lead Implant Date: 20160111
Implantable Lead Location: 753858
Implantable Lead Location: 753859
Implantable Lead Location: 753860
Implantable Lead Model: 181
Implantable Lead Serial Number: 326242
Implantable Pulse Generator Implant Date: 20160111
Lead Channel Impedance Value: 440 Ohm
Lead Channel Impedance Value: 530 Ohm
Lead Channel Impedance Value: 560 Ohm
Lead Channel Pacing Threshold Amplitude: 0.75 V
Lead Channel Pacing Threshold Amplitude: 1.25 V
Lead Channel Pacing Threshold Pulse Width: 0.5 ms
Lead Channel Pacing Threshold Pulse Width: 0.5 ms
Lead Channel Sensing Intrinsic Amplitude: 11.8 mV
Lead Channel Sensing Intrinsic Amplitude: 5 mV
Lead Channel Setting Pacing Amplitude: 2.5 V
Lead Channel Setting Pacing Amplitude: 2.5 V
Lead Channel Setting Pacing Pulse Width: 0.5 ms
Lead Channel Setting Pacing Pulse Width: 0.5 ms
Lead Channel Setting Sensing Sensitivity: 2 mV
Pulse Gen Serial Number: 7210309

## 2019-04-02 ENCOUNTER — Encounter: Payer: Self-pay | Admitting: Internal Medicine

## 2019-04-02 ENCOUNTER — Other Ambulatory Visit: Payer: Self-pay

## 2019-04-02 ENCOUNTER — Ambulatory Visit: Payer: 59 | Admitting: Internal Medicine

## 2019-04-02 VITALS — BP 122/82 | HR 61 | Ht 72.0 in | Wt 227.4 lb

## 2019-04-02 DIAGNOSIS — D8685 Sarcoid myocarditis: Secondary | ICD-10-CM

## 2019-04-02 DIAGNOSIS — Z95810 Presence of automatic (implantable) cardiac defibrillator: Secondary | ICD-10-CM

## 2019-04-02 DIAGNOSIS — I469 Cardiac arrest, cause unspecified: Secondary | ICD-10-CM | POA: Diagnosis not present

## 2019-04-02 DIAGNOSIS — I442 Atrioventricular block, complete: Secondary | ICD-10-CM | POA: Diagnosis not present

## 2019-04-02 NOTE — Progress Notes (Signed)
HPI [name] returns today for followup. He is a pleasant 54 yo man with cardiac sarcoid, CHB, VT, and severe LV dysfunction who underwent biv ICD insertion and then had a marked improvement of his cardiac conduction and has not had VT. He remains on CHF therapy and feels well. He denies chest pain or sob. No syncope. No edema. He is working as a Music therapist.  Allergies  Allergen Reactions  . Benadryl [Diphenhydramine Hcl (Sleep)] Anaphylaxis  . Diphenhydramine Swelling     Current Outpatient Medications  Medication Sig Dispense Refill  . carvedilol (COREG) 12.5 MG tablet TAKE 1 TABLET BY MOUTH  TWICE DAILY WITH A MEAL 180 tablet 3  . ramipril (ALTACE) 2.5 MG capsule TAKE 1 CAPSULE BY MOUTH  DAILY 90 capsule 3   No current facility-administered medications for this visit.     Past Medical History:  Diagnosis Date  . Acute renal insufficiency    a. During adm 03/2014 for VT arrest.  . Cardiac arrest (HCC)    a. VT arrest with recurrent VT 03/2014, s/p CPR, multiple defib. Ultimately felt 2/2 cardiac sarcoid. Placed on amiodarone. Hospitalization complicated by circulatory shock, VDRF, encephalopathy, AKI, and hiccups which improved by discharge.  . Cardiac sarcoidosis   . Complete heart block (HCC)    a. s/p St Jude BiV ICD/pacemaker placement w/ BS leads 03/17/14  . GERD (gastroesophageal reflux disease)   . Non-ischemic cardiomyopathy (HCC)    a. EF15-20% by echo 03/12/14 - s/p BiV-ICD placement. Normal cors 03/2014.  . Pulmonary sarcoidosis (HCC)   . RBBB   . Recurrent ventricular tachycardia (HCC)     ROS:   All systems reviewed and negative except as noted in the HPI.   Past Surgical History:  Procedure Laterality Date  . BI-VENTRICULAR IMPLANTABLE CARDIOVERTER DEFIBRILLATOR N/A 03/17/2014   Procedure: BI-VENTRICULAR IMPLANTABLE CARDIOVERTER DEFIBRILLATOR  (CRT-D);  Surgeon: Marinus Maw, MD;  Location: [facility] CATH LAB;  Service: Cardiovascular;  Laterality: N/A;    . LEFT HEART CATHETERIZATION WITH CORONARY ANGIOGRAM N/A 03/12/2014   Procedure: LEFT HEART CATHETERIZATION WITH CORONARY ANGIOGRAM;  Surgeon: Iran Ouch, MD;  Location: [facility] CATH LAB;  Service: Cardiovascular;  Laterality: N/A;     Family History  Problem Relation Age of Onset  . Stroke Father   . Hypertension Maternal Grandfather   . Hypertension Maternal Grandmother      Social History   Socioeconomic History  . Marital status: Married    Spouse name: Not on file  . Number of children: Not on file  . Years of education: Not on file  . Highest education level: Not on file  Occupational History  . Occupation: Emergency planning/management officer  Tobacco Use  . Smoking status: Passive Smoke Exposure - Never Smoker  . Smokeless tobacco: Never Used  . Tobacco comment: second hand as child  Substance and Sexual Activity  . Alcohol use: No    Alcohol/week: 0.0 standard drinks  . Drug use: No  . Sexual activity: Not on file  Other Topics Concern  . Not on file  Social History Narrative  . Not on file   Social Determinants of Health   Financial Resource Strain:   . Difficulty of Paying Living Expenses: Not on file  Food Insecurity:   . Worried About Programme researcher, broadcasting/film/video in the Last Year: Not on file  . Ran Out of Food in the Last Year: Not on file  Transportation Needs:   . Lack of Transportation (  Medical): Not on file  . Lack of Transportation (Non-Medical): Not on file  Physical Activity:   . Days of Exercise per Week: Not on file  . Minutes of Exercise per Session: Not on file  Stress:   . Feeling of Stress : Not on file  Social Connections:   . Frequency of Communication with Friends and Family: Not on file  . Frequency of Social Gatherings with Friends and Family: Not on file  . Attends Religious Services: Not on file  . Active Member of Clubs or Organizations: Not on file  . Attends Archivist Meetings: Not on file  . Marital Status: Not on file  Intimate Partner  Violence:   . Fear of Current or Ex-Partner: Not on file  . Emotionally Abused: Not on file  . Physically Abused: Not on file  . Sexually Abused: Not on file     BP 122/82   Pulse 61   Ht 6' (1.829 m)   Wt 227 lb 6.4 oz (103.1 kg)   SpO2 97%   BMI 30.84 kg/m   Physical Exam:  Well appearing NAD HEENT: Unremarkable Neck:  No JVD, no thyromegally Lymphatics:  No adenopathy Back:  No CVA tenderness Lungs:  Clear with no wheezes HEART:  Regular rate rhythm, no murmurs, no rubs, no clicks Abd:  soft, positive bowel sounds, no organomegally, no rebound, no guarding Ext:  2 plus pulses, no edema, no cyanosis, no clubbing Skin:  No rashes no nodules Neuro:  CN II through XII intact, motor grossly intact  EKG - nsr   DEVICE  Normal device function.  See PaceArt for details.   Assess/Plan: 1. VT - he has had no recurrent symptoms. He will continue his current meds. 2. Chronic systolic heart failure - he has had improment in his LV function and will continue his current meds. 3. ICD - his St. Jude biv ICD is working normally. Because of return of his conduction, we have reprogrammed his device VVI. 4. CHB - his conduction has returned. He will undergo watchful waiting.  Christopher Patrick, M.D.

## 2019-04-02 NOTE — Patient Instructions (Signed)

## 2019-04-03 LAB — CUP PACEART INCLINIC DEVICE CHECK
Date Time Interrogation Session: 20210126113724
Implantable Lead Implant Date: 20160111
Implantable Lead Implant Date: 20160111
Implantable Lead Implant Date: 20160111
Implantable Lead Location: 753858
Implantable Lead Location: 753859
Implantable Lead Location: 753860
Implantable Lead Model: 181
Implantable Lead Serial Number: 326242
Implantable Pulse Generator Implant Date: 20160111
Pulse Gen Serial Number: 7210309

## 2019-06-04 ENCOUNTER — Ambulatory Visit (INDEPENDENT_AMBULATORY_CARE_PROVIDER_SITE_OTHER): Payer: 59 | Admitting: *Deleted

## 2019-06-04 DIAGNOSIS — I469 Cardiac arrest, cause unspecified: Secondary | ICD-10-CM | POA: Diagnosis not present

## 2019-06-04 LAB — CUP PACEART REMOTE DEVICE CHECK
Battery Remaining Longevity: 46 mo
Battery Remaining Percentage: 41 %
Battery Voltage: 2.92 V
Date Time Interrogation Session: 20210330045225
HighPow Impedance: 75 Ohm
HighPow Impedance: 75 Ohm
Implantable Lead Implant Date: 20160111
Implantable Lead Implant Date: 20160111
Implantable Lead Implant Date: 20160111
Implantable Lead Location: 753858
Implantable Lead Location: 753859
Implantable Lead Location: 753860
Implantable Lead Model: 181
Implantable Lead Serial Number: 326242
Implantable Pulse Generator Implant Date: 20160111
Lead Channel Impedance Value: 430 Ohm
Lead Channel Impedance Value: 560 Ohm
Lead Channel Impedance Value: 590 Ohm
Lead Channel Pacing Threshold Amplitude: 1 V
Lead Channel Pacing Threshold Amplitude: 1.5 V
Lead Channel Pacing Threshold Pulse Width: 0.5 ms
Lead Channel Pacing Threshold Pulse Width: 0.5 ms
Lead Channel Sensing Intrinsic Amplitude: 11.4 mV
Lead Channel Sensing Intrinsic Amplitude: 5 mV
Lead Channel Setting Pacing Amplitude: 2.5 V
Lead Channel Setting Pacing Amplitude: 2.5 V
Lead Channel Setting Pacing Pulse Width: 0.5 ms
Lead Channel Setting Pacing Pulse Width: 0.5 ms
Lead Channel Setting Sensing Sensitivity: 0.5 mV
Pulse Gen Serial Number: 7210309

## 2019-06-04 NOTE — Progress Notes (Signed)
ICD remote 

## 2019-09-03 ENCOUNTER — Ambulatory Visit (INDEPENDENT_AMBULATORY_CARE_PROVIDER_SITE_OTHER): Payer: 59 | Admitting: *Deleted

## 2019-09-03 DIAGNOSIS — I428 Other cardiomyopathies: Secondary | ICD-10-CM | POA: Diagnosis not present

## 2019-09-03 LAB — CUP PACEART REMOTE DEVICE CHECK
Battery Remaining Longevity: 43 mo
Battery Remaining Percentage: 39 %
Battery Voltage: 2.92 V
Date Time Interrogation Session: 20210629020023
HighPow Impedance: 78 Ohm
HighPow Impedance: 78 Ohm
Implantable Lead Implant Date: 20160111
Implantable Lead Implant Date: 20160111
Implantable Lead Implant Date: 20160111
Implantable Lead Location: 753858
Implantable Lead Location: 753859
Implantable Lead Location: 753860
Implantable Lead Model: 181
Implantable Lead Serial Number: 326242
Implantable Pulse Generator Implant Date: 20160111
Lead Channel Impedance Value: 430 Ohm
Lead Channel Impedance Value: 560 Ohm
Lead Channel Impedance Value: 590 Ohm
Lead Channel Pacing Threshold Amplitude: 1 V
Lead Channel Pacing Threshold Amplitude: 1.5 V
Lead Channel Pacing Threshold Pulse Width: 0.5 ms
Lead Channel Pacing Threshold Pulse Width: 0.5 ms
Lead Channel Sensing Intrinsic Amplitude: 11.4 mV
Lead Channel Sensing Intrinsic Amplitude: 5 mV
Lead Channel Setting Pacing Amplitude: 2.5 V
Lead Channel Setting Pacing Amplitude: 2.5 V
Lead Channel Setting Pacing Pulse Width: 0.5 ms
Lead Channel Setting Pacing Pulse Width: 0.5 ms
Lead Channel Setting Sensing Sensitivity: 0.5 mV
Pulse Gen Serial Number: 7210309

## 2019-09-04 NOTE — Progress Notes (Signed)
Remote ICD transmission.   

## 2019-12-05 ENCOUNTER — Other Ambulatory Visit: Payer: Self-pay | Admitting: Internal Medicine

## 2019-12-05 DIAGNOSIS — I428 Other cardiomyopathies: Secondary | ICD-10-CM

## 2019-12-06 ENCOUNTER — Ambulatory Visit (INDEPENDENT_AMBULATORY_CARE_PROVIDER_SITE_OTHER): Payer: 59

## 2019-12-06 DIAGNOSIS — I428 Other cardiomyopathies: Secondary | ICD-10-CM | POA: Diagnosis not present

## 2019-12-08 LAB — CUP PACEART REMOTE DEVICE CHECK
Battery Remaining Longevity: 40 mo
Battery Remaining Percentage: 36 %
Battery Voltage: 2.92 V
Date Time Interrogation Session: 20210930175913
HighPow Impedance: 73 Ohm
HighPow Impedance: 73 Ohm
Implantable Lead Implant Date: 20160111
Implantable Lead Implant Date: 20160111
Implantable Lead Implant Date: 20160111
Implantable Lead Location: 753858
Implantable Lead Location: 753859
Implantable Lead Location: 753860
Implantable Lead Model: 181
Implantable Lead Serial Number: 326242
Implantable Pulse Generator Implant Date: 20160111
Lead Channel Impedance Value: 440 Ohm
Lead Channel Impedance Value: 490 Ohm
Lead Channel Impedance Value: 590 Ohm
Lead Channel Pacing Threshold Amplitude: 1 V
Lead Channel Pacing Threshold Amplitude: 1.5 V
Lead Channel Pacing Threshold Pulse Width: 0.5 ms
Lead Channel Pacing Threshold Pulse Width: 0.5 ms
Lead Channel Sensing Intrinsic Amplitude: 11.4 mV
Lead Channel Sensing Intrinsic Amplitude: 5 mV
Lead Channel Setting Pacing Amplitude: 2.5 V
Lead Channel Setting Pacing Amplitude: 2.5 V
Lead Channel Setting Pacing Pulse Width: 0.5 ms
Lead Channel Setting Pacing Pulse Width: 0.5 ms
Lead Channel Setting Sensing Sensitivity: 0.5 mV
Pulse Gen Serial Number: 7210309

## 2019-12-09 NOTE — Progress Notes (Signed)
Remote ICD transmission.   

## 2020-02-27 ENCOUNTER — Other Ambulatory Visit: Payer: Self-pay | Admitting: Internal Medicine

## 2020-02-27 DIAGNOSIS — I428 Other cardiomyopathies: Secondary | ICD-10-CM

## 2020-03-04 ENCOUNTER — Telehealth: Payer: Self-pay | Admitting: Pulmonary Disease

## 2020-03-04 DIAGNOSIS — D869 Sarcoidosis, unspecified: Secondary | ICD-10-CM

## 2020-03-04 NOTE — Telephone Encounter (Signed)
Spoke with the pt's spouse and notified of response per Arlys John and she verbalized understanding. She is aware to have pt arrive 30 min prior to appt time 03/13/19. I have scheduled pt appt for 04/27/20.

## 2020-03-04 NOTE — Telephone Encounter (Signed)
Primary Pulmonologist: Sood Last office visit and with whom: 08/06/2018 Rubye Oaks NP What do we see them for (pulmonary problems): Pulmonary Sarcoid Last OV assessment/plan:  Assessment & Plan:   No problem-specific Assessment & Plan notes found for this encounter.     Rubye Oaks, NP 08/06/2018       Assessment & Plan Note by Julio Sicks, NP at 08/07/2018 5:01 PM  Author: Julio Sicks, NP Author Type: Nurse Practitioner Filed: 08/07/2018 5:03 PM  Note Status: Written Cosign: Cosign Not Required Encounter Date: 08/06/2018  Problem: Pulmonary sarcoidosis - by report  Editor: Julio Sicks, NP (Nurse Practitioner)               Pulmonary sarcoidosis-along with cardiac sarcoidosis treated with aggressive steroids in 2016 weaned off of steroids in 2017.  Patient has done clinically very well.  Follow-up CT chest in August 2017 showed stable to improved small pulmonary nodules and adenopathy.  Patient denies any ongoing symptoms with cough or shortness of breath.  Remains active.  PFTs were stable in 2016. Patient will need follow-up PFTs once COVID precautions are lifted will set these up.  Check chest x-ray today  Plan  Patient Instructions  Activity as tolerated.  Chest xray today  Follow up with Dr. Craige Cotta  In 1 year and As needed            Patient Instructions by Julio Sicks, NP at 08/06/2018 3:00 PM  Author: Julio Sicks, NP Author Type: Nurse Practitioner Filed: 08/06/2018 3:25 PM  Note Status: Addendum Cosign: Cosign Not Required Encounter Date: 08/06/2018  Editor: Julio Sicks, NP (Nurse Practitioner)      Prior Versions: 1. Parrett, Virgel Bouquet, NP (Nurse Practitioner) at 08/06/2018 3:22 PM - Signed    Activity as tolerated.  Chest xray today  Follow up with Dr. Craige Cotta  In 1 year and As needed          Instructions     Return in about 1 year (around 08/06/2019) for Follow up Dr. Craige Cotta.  Activity as tolerated.  Chest xray today  Follow  up with Dr. Craige Cotta  In 1 year and As needed          After Visit Summary (Printed 08/06/2018)   Communications     [facility] Provider CC Chart Rep sent to Loyal Jacobson, MD  Sent 08/07/2018    Media From this encounter Electronic signature on 08/06/2018 3:41 PM: LBPU 6.1.20 - E-signed   Geneticist, molecular History  Recipient Method Sent by Date Sent  Loyal Jacobson, MD Fax Parrett, Virgel Bouquet, NP 08/07/2018  Fax: [phone]  Phone: [phone]   No questionnaires available.        Reason for call: hx of covid (December 1st, 2021 (was not hospitalized), still coughing (not able to cough up mucus), denies fever, chills body aches. Has not had covid vaccines.  No flu vaccine for this season.  Had been using Budesemide twice daily up until 2 days ago, this had been making him feel better.  No recent cxr.  He is on no oxygen and no inhalers.   sats on RA range from 91%-94%, denies any sob with exertion, however, has labored breathing at night.  They have not checked sats at night.  He is taking musinex fast max, started yesterday.  He has nasal congestion, but unable to blow any mucus out.  Wife wonders if he has pna.  Saw pcp recently, but no cxr was done.  He has an appointment on 03/12/2020 as he has not been seen since 08/06/2018, wife states he was released by Dr. Craige Cotta as he was doing well.  Arlys John please advise.  (examples of things to ask: : When did symptoms start? Fever? Cough? Productive? Color to sputum? More sputum than usual? Wheezing? Have you needed increased oxygen? Are you taking your respiratory medications? What over the counter measures have you tried?)  Allergies  Allergen Reactions   Benadryl [Diphenhydramine Hcl (Sleep)] Anaphylaxis   Diphenhydramine Swelling    Immunization History  Administered Date(s) Administered   Influenza Split 10/05/2013   Influenza,inj,Quad PF,6+ Mos 02/05/2015   Pneumococcal Polysaccharide-23 10/05/2012

## 2020-03-04 NOTE — Telephone Encounter (Signed)
03/04/2020  I have no new recommendations other than to keep the upcoming appointment that he has on 03/12/2020.  Would recommend ordering a stat chest x-ray prior to the 03/12/2020 office visit same day.  Patient will need to arrive 30 minutes before his appointment to complete this chest x-ray.  If patient symptoms worsen he will need to seek emergent evaluation.  If 03/12/2020 office visit is with an APP.  Please schedule also a 15-minute appointment with Dr. Craige Cotta at next available as patient has not been seen by him in quite some time.  Elisha Headland, FNP

## 2020-03-12 ENCOUNTER — Ambulatory Visit: Payer: 59 | Admitting: Pulmonary Disease

## 2020-03-30 ENCOUNTER — Ambulatory Visit: Payer: 59 | Admitting: Internal Medicine

## 2020-03-30 ENCOUNTER — Encounter: Payer: Self-pay | Admitting: Internal Medicine

## 2020-03-30 ENCOUNTER — Other Ambulatory Visit: Payer: Self-pay

## 2020-03-30 VITALS — BP 162/90 | HR 60 | Ht 72.0 in | Wt 213.0 lb

## 2020-03-30 DIAGNOSIS — I428 Other cardiomyopathies: Secondary | ICD-10-CM

## 2020-03-30 DIAGNOSIS — I442 Atrioventricular block, complete: Secondary | ICD-10-CM

## 2020-03-30 DIAGNOSIS — I469 Cardiac arrest, cause unspecified: Secondary | ICD-10-CM

## 2020-03-30 DIAGNOSIS — Z95810 Presence of automatic (implantable) cardiac defibrillator: Secondary | ICD-10-CM | POA: Diagnosis not present

## 2020-03-30 MED ORDER — FUROSEMIDE 40 MG PO TABS: 40.0000 mg | ORAL_TABLET | Freq: Every day | ORAL | 3 refills | Status: DC | PRN

## 2020-03-30 NOTE — Patient Instructions (Addendum)
Medication Instructions:  Your physician has recommended you make the following change in your medication:   1.  TAKE furosemide (lasix) 40 mg- one tablet by mouth as needed for shortness of breath  Labwork: None ordered.  Testing/Procedures: None ordered.  Follow-Up: Your physician wants you to follow-up in: one year with Lewayne Bunting, MD or one of the following Advanced Practice Providers on your designated Care Team:    Gypsy Balsam, NP  Francis Dowse, PA-C  Casimiro Needle "Mardelle Matte" [place], [place]  Remote monitoring is used to monitor your ICD from home. This monitoring reduces the number of office visits required to check your device to one time per year. It allows [place] to keep an eye on the functioning of your device to ensure it is working properly. You are scheduled for a device check from home on 06/08/2020. You may send your transmission at any time that day. If you have a wireless device, the transmission will be sent automatically. After your physician reviews your transmission, you will receive a postcard with your next transmission date.  Any Other Special Instructions Will Be Listed Below (If Applicable).  If you need a refill on your cardiac medications before your next appointment, please call your pharmacy.

## 2020-03-30 NOTE — Progress Notes (Signed)
HPI [name] returns today for followup. He is a pleasant 55 yo man with cardiac sarcoid, CHB, VT, and severe LV dysfunction who underwent biv ICD insertion and then had a marked improvement of his cardiac conduction and has not had VT. He remains on CHF therapy and feels well. He denies chest pain or sob. No syncope. No edema. 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 Allergies  Allergen Reactions  . Benadryl [Diphenhydramine Hcl (Sleep)] Anaphylaxis  . Diphenhydramine Swelling     Current Outpatient Medications  Medication Sig Dispense Refill  . carvedilol (COREG) 12.5 MG tablet TAKE 1 TABLET BY MOUTH  TWICE DAILY WITH A MEAL 180 tablet 0  . furosemide (LASIX) 40 MG tablet Take 1 tablet (40 mg total) by mouth daily as needed (shortness of breath). 90 tablet 3  . ramipril (ALTACE) 2.5 MG capsule TAKE 1 CAPSULE BY MOUTH  DAILY 90 capsule 0   No current facility-administered medications for this visit.     Past Medical History:  Diagnosis Date  . Acute renal insufficiency    a. During adm 03/2014 for VT arrest.  . Cardiac arrest (HCC)    a. VT arrest with recurrent VT 03/2014, s/p CPR, multiple defib. Ultimately felt 2/2 cardiac sarcoid. Placed on amiodarone. Hospitalization complicated by circulatory shock, VDRF, encephalopathy, AKI, and hiccups which improved by discharge.  . Cardiac sarcoidosis   . Complete heart block (HCC)    a. s/p St Jude BiV ICD/pacemaker placement w/ BS leads 03/17/14  . GERD (gastroesophageal reflux disease)   . Non-ischemic cardiomyopathy (HCC)    a. EF15-20% by echo 03/12/14 - s/p BiV-ICD placement. Normal cors 03/2014.  . Pulmonary sarcoidosis (HCC)   . RBBB   . Recurrent ventricular tachycardia (HCC)     ROS:   All systems reviewed and negative except as noted in the HPI.   Past Surgical History:  Procedure Laterality Date  . BI-VENTRICULAR IMPLANTABLE CARDIOVERTER DEFIBRILLATOR N/A 03/17/2014   Procedure:  BI-VENTRICULAR IMPLANTABLE CARDIOVERTER DEFIBRILLATOR  (CRT-D);  Surgeon: Marinus Maw, MD;  Location: [facility] CATH LAB;  Service: Cardiovascular;  Laterality: N/A;  . LEFT HEART CATHETERIZATION WITH CORONARY ANGIOGRAM N/A 03/12/2014   Procedure: LEFT HEART CATHETERIZATION WITH CORONARY ANGIOGRAM;  Surgeon: Iran Ouch, MD;  Location: [facility] CATH LAB;  Service: Cardiovascular;  Laterality: N/A;     Family History  Problem Relation Age of Onset  . Stroke Father   . Hypertension Maternal Grandfather   . Hypertension Maternal Grandmother      Social History   Socioeconomic History  . Marital status: Married    Spouse name: Not on file  . Number of children: Not on file  . Years of education: Not on file  . Highest education level: Not on file  Occupational History  . Occupation: Emergency planning/management officer  Tobacco Use  . Smoking status: Passive Smoke Exposure - Never Smoker  . Smokeless tobacco: Never Used  . Tobacco comment: second hand as child  Vaping Use  . Vaping Use: Never used  Substance and Sexual Activity  . Alcohol use: No    Alcohol/week: 0.0 standard drinks  . Drug use: No  . Sexual activity: Not on file  Other Topics Concern  . Not on file  Social History Narrative  . Not on file   Social Determinants of Health   Financial Resource Strain: Not on file  Food Insecurity: Not on file  Transportation Needs: Not on  file  Physical Activity: Not on file  Stress: Not on file  Social Connections: Not on file  Intimate Partner Violence: Not on file     BP (!) 162/90   Pulse 60   Ht 6' (1.829 m)   Wt 213 lb (96.6 kg)   SpO2 97%   BMI 28.89 kg/m   Physical Exam:  Well appearing NAD HEENT: Unremarkable Neck:  No JVD, no thyromegally Lymphatics:  No adenopathy Back:  No CVA tenderness Lungs:  Clear with no wheezes HEART:  Regular rate rhythm, no murmurs, no rubs, no clicks Abd:  soft, positive bowel sounds, no organomegally, no rebound, no guarding Ext:  2 plus  pulses, no edema, no cyanosis, no clubbing Skin:  No rashes no nodules Neuro:  CN II through XII intact, motor grossly intact  EKG - nsr  DEVICE  Normal device function.  See PaceArt for details.   Assess/Plan: 1. CHB - he is conducting now with no problems.  2. ICD - his biv ICD is working normally. He has not had more VT.  3. VT - he has been arrhythmia from for years.  4. Cardiac sarcoid - no evidence of re-activation. We will follow.  Sharlot Gowda Katriana Dortch,MD

## 2020-03-31 LAB — CUP PACEART INCLINIC DEVICE CHECK
Battery Remaining Longevity: 38 mo
Brady Statistic RA Percent Paced: 0 %
Brady Statistic RV Percent Paced: 2.2 %
Date Time Interrogation Session: 20220124165428
HighPow Impedance: 68.625
Implantable Lead Implant Date: 20160111
Implantable Lead Implant Date: 20160111
Implantable Lead Implant Date: 20160111
Implantable Lead Location: 753858
Implantable Lead Location: 753859
Implantable Lead Location: 753860
Implantable Lead Model: 181
Implantable Lead Serial Number: 326242
Implantable Pulse Generator Implant Date: 20160111
Lead Channel Impedance Value: 425 Ohm
Lead Channel Impedance Value: 512.5 Ohm
Lead Channel Impedance Value: 562.5 Ohm
Lead Channel Pacing Threshold Amplitude: 0.75 V
Lead Channel Pacing Threshold Amplitude: 0.75 V
Lead Channel Pacing Threshold Amplitude: 1.5 V
Lead Channel Pacing Threshold Amplitude: 1.5 V
Lead Channel Pacing Threshold Pulse Width: 0.5 ms
Lead Channel Pacing Threshold Pulse Width: 0.5 ms
Lead Channel Pacing Threshold Pulse Width: 0.5 ms
Lead Channel Pacing Threshold Pulse Width: 0.5 ms
Lead Channel Sensing Intrinsic Amplitude: 11.4 mV
Lead Channel Setting Pacing Amplitude: 2.5 V
Lead Channel Setting Pacing Amplitude: 2.5 V
Lead Channel Setting Pacing Pulse Width: 0.5 ms
Lead Channel Setting Pacing Pulse Width: 0.5 ms
Lead Channel Setting Sensing Sensitivity: 0.5 mV
Pulse Gen Serial Number: 7210309

## 2020-04-02 ENCOUNTER — Ambulatory Visit (INDEPENDENT_AMBULATORY_CARE_PROVIDER_SITE_OTHER): Payer: 59

## 2020-04-02 DIAGNOSIS — I472 Ventricular tachycardia, unspecified: Secondary | ICD-10-CM

## 2020-04-02 LAB — CUP PACEART REMOTE DEVICE CHECK
Battery Remaining Longevity: 35 mo
Battery Remaining Percentage: 33 %
Battery Voltage: 2.9 V
Date Time Interrogation Session: 20220127014256
HighPow Impedance: 70 Ohm
HighPow Impedance: 70 Ohm
Implantable Lead Implant Date: 20160111
Implantable Lead Implant Date: 20160111
Implantable Lead Implant Date: 20160111
Implantable Lead Location: 753858
Implantable Lead Location: 753859
Implantable Lead Location: 753860
Implantable Lead Model: 181
Implantable Lead Serial Number: 326242
Implantable Pulse Generator Implant Date: 20160111
Lead Channel Impedance Value: 430 Ohm
Lead Channel Impedance Value: 550 Ohm
Lead Channel Impedance Value: 580 Ohm
Lead Channel Pacing Threshold Amplitude: 0.75 V
Lead Channel Pacing Threshold Amplitude: 1.5 V
Lead Channel Pacing Threshold Pulse Width: 0.5 ms
Lead Channel Pacing Threshold Pulse Width: 0.5 ms
Lead Channel Sensing Intrinsic Amplitude: 11.4 mV
Lead Channel Sensing Intrinsic Amplitude: 2.6 mV
Lead Channel Setting Pacing Amplitude: 2.5 V
Lead Channel Setting Pacing Amplitude: 2.5 V
Lead Channel Setting Pacing Pulse Width: 0.5 ms
Lead Channel Setting Pacing Pulse Width: 0.5 ms
Lead Channel Setting Sensing Sensitivity: 0.5 mV
Pulse Gen Serial Number: 7210309

## 2020-04-12 NOTE — Progress Notes (Signed)
Remote ICD transmission.   

## 2020-04-27 ENCOUNTER — Ambulatory Visit: Payer: 59 | Admitting: Pulmonary Disease

## 2020-05-16 ENCOUNTER — Other Ambulatory Visit: Payer: Self-pay | Admitting: Internal Medicine

## 2020-05-16 DIAGNOSIS — I428 Other cardiomyopathies: Secondary | ICD-10-CM

## 2020-05-18 ENCOUNTER — Telehealth: Payer: Self-pay

## 2020-05-18 NOTE — Telephone Encounter (Signed)
Patient called in and wants to know if he can have his ICD checks twice a year or more spaced out due to his insurance and not being able to afford it. I let patient know that a nurse will call him back

## 2020-05-18 NOTE — Telephone Encounter (Signed)
Message sent to Dr. Ladona Ridgel to please advise. Called patient to let him know I sent it to Dr. Ladona Ridgel and we will call him back for update after he gets back with [place]. Patient agreeable to plan.

## 2020-05-19 NOTE — Telephone Encounter (Signed)
Patient informed that DR Ladona Ridgel recommends in-clinic ICD check every 6 months and at his yearly visit with Dr Ladona Ridgel if home remotes are a financial burden. Patient agrees to plan and in-clinic check scheduled for 08/27/20 at  1400. Patient will unplug his remote monitor and contact the device clinic if he decides to resume remote monitoring. Remote transmissions cancelled in [employer] and [employer].

## 2020-09-10 ENCOUNTER — Other Ambulatory Visit: Payer: Self-pay

## 2020-09-10 ENCOUNTER — Ambulatory Visit (INDEPENDENT_AMBULATORY_CARE_PROVIDER_SITE_OTHER): Payer: 59

## 2020-09-10 DIAGNOSIS — I469 Cardiac arrest, cause unspecified: Secondary | ICD-10-CM

## 2020-09-10 LAB — CUP PACEART INCLINIC DEVICE CHECK
Battery Remaining Longevity: 33 mo
Brady Statistic RA Percent Paced: 0 %
Brady Statistic RV Percent Paced: 3 %
Date Time Interrogation Session: 20220707095004
HighPow Impedance: 70.875
Implantable Lead Implant Date: 20160111
Implantable Lead Implant Date: 20160111
Implantable Lead Implant Date: 20160111
Implantable Lead Location: 753858
Implantable Lead Location: 753859
Implantable Lead Location: 753860
Implantable Lead Model: 181
Implantable Lead Serial Number: 326242
Implantable Pulse Generator Implant Date: 20160111
Lead Channel Impedance Value: 412.5 Ohm
Lead Channel Impedance Value: 475 Ohm
Lead Channel Impedance Value: 575 Ohm
Lead Channel Pacing Threshold Amplitude: 0.75 V
Lead Channel Pacing Threshold Amplitude: 1.5 V
Lead Channel Pacing Threshold Pulse Width: 0.5 ms
Lead Channel Pacing Threshold Pulse Width: 0.5 ms
Lead Channel Sensing Intrinsic Amplitude: 11.4 mV
Lead Channel Sensing Intrinsic Amplitude: 5 mV
Lead Channel Setting Pacing Amplitude: 2.5 V
Lead Channel Setting Pacing Amplitude: 2.5 V
Lead Channel Setting Pacing Pulse Width: 0.5 ms
Lead Channel Setting Pacing Pulse Width: 0.5 ms
Lead Channel Setting Sensing Sensitivity: 0.5 mV
Pulse Gen Serial Number: 7210309

## 2020-09-10 NOTE — Progress Notes (Signed)
6 month in-clinic check secondary to remotes discontinued per patient preference. See PaceArt

## 2021-06-16 ENCOUNTER — Other Ambulatory Visit: Payer: Self-pay | Admitting: Internal Medicine

## 2021-06-16 DIAGNOSIS — I428 Other cardiomyopathies: Secondary | ICD-10-CM

## 2021-07-26 ENCOUNTER — Encounter: Payer: Self-pay | Admitting: Internal Medicine

## 2021-07-26 ENCOUNTER — Ambulatory Visit (INDEPENDENT_AMBULATORY_CARE_PROVIDER_SITE_OTHER): Payer: 59 | Admitting: Internal Medicine

## 2021-07-26 VITALS — BP 126/74 | HR 56 | Ht 72.0 in | Wt 226.0 lb

## 2021-07-26 DIAGNOSIS — I472 Ventricular tachycardia, unspecified: Secondary | ICD-10-CM

## 2021-07-26 DIAGNOSIS — D8685 Sarcoid myocarditis: Secondary | ICD-10-CM | POA: Diagnosis not present

## 2021-07-26 DIAGNOSIS — Z95810 Presence of automatic (implantable) cardiac defibrillator: Secondary | ICD-10-CM | POA: Diagnosis not present

## 2021-07-26 DIAGNOSIS — I442 Atrioventricular block, complete: Secondary | ICD-10-CM

## 2021-07-26 NOTE — Patient Instructions (Signed)
Medication Instructions:  Your physician recommends that you continue on your current medications as directed. Please refer to the Current Medication list given to you today. *If you need a refill on your cardiac medications before your next appointment, please call your pharmacy*  Lab Work: None. If you have labs (blood work) drawn today and your tests are completely normal, you will receive your results only by: MyChart Message (if you have MyChart) OR A paper copy in the mail If you have any lab test that is abnormal or we need to change your treatment, we will call you to review the results.  Testing/Procedures: None.  Follow-Up: At [facility], you and your health needs are our priority.  As part of our continuing mission to provide you with exceptional heart care, we have created designated Provider Care Teams.  These Care Teams include your primary Cardiologist (physician) and Advanced Practice Providers (APPs -  Physician Assistants and Nurse Practitioners) who all work together to provide you with the care you need, when you need it.  Your physician wants you to follow-up in: 12 months with Gregg Taylor, MD or one of the following Advanced Practice Providers on your designated Care Team:    Renee Ursuy, PA-C Reymond "Andy" Tillery, PA-C   You will receive a reminder letter in the mail two months in advance. If you don't receive a letter, please call our office to schedule the follow-up appointment.  We recommend signing up for the patient portal called "MyChart".  Sign up information is provided on this After Visit Summary.  MyChart is used to connect with patients for Virtual Visits (Telemedicine).  Patients are able to view lab/test results, encounter notes, upcoming appointments, etc.  Non-urgent messages can be sent to your provider as well.   To learn more about what you can do with MyChart, go to [email or web address].    Any Other Special Instructions Will Be Listed  Below (If Applicable).         

## 2021-07-26 NOTE — Progress Notes (Signed)
HPI Christopher Patrick returns today for followup. He is a pleasant 56 yo man with cardiac sarcoid, CHB, VT, and severe LV dysfunction who underwent biv ICD insertion and then had a marked improvement of his cardiac conduction and EF and has not had VT. He remains on CHF therapy and feels well. He denies chest pain or sob. No syncope. No edema. 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 He has not had any ICD therapies despite coming off of amiodarone. Allergies  Allergen Reactions   Benadryl [Diphenhydramine Hcl (Sleep)] Anaphylaxis   Diphenhydramine Swelling     Current Outpatient Medications  Medication Sig Dispense Refill   carvedilol (COREG) 12.5 MG tablet TAKE 1 TABLET BY MOUTH  TWICE DAILY WITH MEALS 180 tablet 0   ramipril (ALTACE) 2.5 MG capsule TAKE 1 CAPSULE BY MOUTH  DAILY 90 capsule 0   furosemide (LASIX) 40 MG tablet Take 1 tablet (40 mg total) by mouth daily as needed (shortness of breath). 90 tablet 3   No current facility-administered medications for this visit.     Past Medical History:  Diagnosis Date   Acute renal insufficiency    a. During adm 03/2014 for VT arrest.   Cardiac arrest [facility])    a. VT arrest with recurrent VT 03/2014, s/p CPR, multiple defib. Ultimately felt 2/2 cardiac sarcoid. Placed on amiodarone. Hospitalization complicated by circulatory shock, VDRF, encephalopathy, AKI, and hiccups which improved by discharge.   Cardiac sarcoidosis    Complete heart block (HCC)    a. s/p St Jude BiV ICD/pacemaker placement w/ BS leads 03/17/14   GERD (gastroesophageal reflux disease)    Non-ischemic cardiomyopathy (HCC)    a. EF15-20% by echo 03/12/14 - s/p BiV-ICD placement. Normal cors 03/2014.   Pulmonary sarcoidosis (HCC)    RBBB    Recurrent ventricular tachycardia (HCC)     ROS:   All systems reviewed and negative except as noted in the HPI.   Past Surgical History:  Procedure  Laterality Date   BI-VENTRICULAR IMPLANTABLE CARDIOVERTER DEFIBRILLATOR N/A 03/17/2014   Procedure: BI-VENTRICULAR IMPLANTABLE CARDIOVERTER DEFIBRILLATOR  (CRT-D);  Surgeon: Marinus Maw, MD;  Location: [facility] CATH LAB;  Service: Cardiovascular;  Laterality: N/A;   LEFT HEART CATHETERIZATION WITH CORONARY ANGIOGRAM N/A 03/12/2014   Procedure: LEFT HEART CATHETERIZATION WITH CORONARY ANGIOGRAM;  Surgeon: Iran Ouch, MD;  Location: [facility] CATH LAB;  Service: Cardiovascular;  Laterality: N/A;     Family History  Problem Relation Age of Onset   Stroke Father    Hypertension Maternal Grandfather    Hypertension Maternal Grandmother      Social History   Socioeconomic History   Marital status: Married    Spouse name: Not on file   Number of children: Not on file   Years of education: Not on file   Highest education level: Not on file  Occupational History   Occupation: Emergency planning/management officer  Tobacco Use   Smoking status: Passive Smoke Exposure - Never Smoker   Smokeless tobacco: Never   Tobacco comments:    second hand as child  Advertising account planner   Vaping Use: Never used  Substance and Sexual Activity   Alcohol use: No    Alcohol/week: 0.0 standard drinks   Drug use: No   Sexual activity: Not on file  Other Topics Concern   Not on file  Social History Narrative   Not on file   Social  Determinants of Health   Financial Resource Strain: Not on file  Food Insecurity: Not on file  Transportation Needs: Not on file  Physical Activity: Not on file  Stress: Not on file  Social Connections: Not on file  Intimate Partner Violence: Not on file     BP 126/74   Pulse (!) 56   Ht 6' (1.829 m)   Wt 226 lb (102.5 kg)   SpO2 97%   BMI 30.65 kg/m   Physical Exam:  Well appearing NAD HEENT: Unremarkable Neck:  No JVD, no thyromegally Lymphatics:  No adenopathy Back:  No CVA tenderness Lungs:  Clear HEART:  Regular rate rhythm, no murmurs, no rubs, no clicks Abd:  soft, positive bowel  sounds, no organomegally, no rebound, no guarding Ext:  2 plus pulses, no edema, no cyanosis, no clubbing Skin:  No rashes no nodules Neuro:  CN II through XII intact, motor grossly intact  EKG - nSR  DEVICE  Normal device function.  See PaceArt for details.   Assess/Plan:  1. CHB - he is conducting now with no problems. His CHB has resolved. 2. ICD - his biv ICD is working normally. He has not had more VT. He is off of amiodarone 3. VT - he has been arrhythmia free for years.  4. Cardiac sarcoid - no evidence of re-activation. We will follow.   Christopher Gowda Shamikia Linskey,MD

## 2021-09-07 ENCOUNTER — Other Ambulatory Visit: Payer: Self-pay | Admitting: Internal Medicine

## 2021-09-07 DIAGNOSIS — I428 Other cardiomyopathies: Secondary | ICD-10-CM

## 2022-05-20 DIAGNOSIS — Z125 Encounter for screening for malignant neoplasm of prostate: Secondary | ICD-10-CM | POA: Diagnosis not present

## 2022-05-20 DIAGNOSIS — Z0000 Encounter for general adult medical examination without abnormal findings: Secondary | ICD-10-CM | POA: Diagnosis not present

## 2022-05-25 DIAGNOSIS — I428 Other cardiomyopathies: Secondary | ICD-10-CM | POA: Diagnosis not present

## 2022-05-25 DIAGNOSIS — D8685 Sarcoid myocarditis: Secondary | ICD-10-CM | POA: Diagnosis not present

## 2022-05-25 DIAGNOSIS — Z0000 Encounter for general adult medical examination without abnormal findings: Secondary | ICD-10-CM | POA: Diagnosis not present

## 2022-05-25 DIAGNOSIS — E669 Obesity, unspecified: Secondary | ICD-10-CM | POA: Diagnosis not present

## 2022-05-25 DIAGNOSIS — F5101 Primary insomnia: Secondary | ICD-10-CM | POA: Diagnosis not present

## 2022-06-01 DIAGNOSIS — L570 Actinic keratosis: Secondary | ICD-10-CM | POA: Diagnosis not present

## 2022-06-01 DIAGNOSIS — Z86008 Personal history of in-situ neoplasm of other site: Secondary | ICD-10-CM | POA: Diagnosis not present

## 2022-06-01 DIAGNOSIS — D225 Melanocytic nevi of trunk: Secondary | ICD-10-CM | POA: Diagnosis not present

## 2022-06-13 ENCOUNTER — Other Ambulatory Visit: Payer: Self-pay | Admitting: *Deleted

## 2022-06-13 DIAGNOSIS — I428 Other cardiomyopathies: Secondary | ICD-10-CM

## 2022-06-13 MED ORDER — CARVEDILOL 12.5 MG PO TABS: 12.5000 mg | ORAL_TABLET | Freq: Two times a day (BID) | ORAL | 0 refills | Status: DC

## 2022-06-13 MED ORDER — RAMIPRIL 2.5 MG PO CAPS: 2.5000 mg | ORAL_CAPSULE | Freq: Every day | ORAL | 0 refills | Status: DC

## 2022-09-01 ENCOUNTER — Encounter: Payer: Self-pay | Admitting: Internal Medicine

## 2022-09-01 ENCOUNTER — Ambulatory Visit: Payer: BC Managed Care – PPO | Attending: Internal Medicine | Admitting: Internal Medicine

## 2022-09-01 VITALS — BP 146/82 | HR 55 | Ht 72.0 in | Wt 228.0 lb

## 2022-09-01 DIAGNOSIS — I472 Ventricular tachycardia, unspecified: Secondary | ICD-10-CM

## 2022-09-01 NOTE — Patient Instructions (Signed)
Medication Instructions:  Your physician recommends that you continue on your current medications as directed. Please refer to the Current Medication list given to you today.  *If you need a refill on your cardiac medications before your next appointment, please call your pharmacy*   Lab Work: None ordered   Testing/Procedures: None ordered   Follow-Up: At [facility], you and your health needs are our priority.  As part of our continuing mission to provide you with exceptional heart care, we have created designated Provider Care Teams.  These Care Teams include your primary Cardiologist (physician) and Advanced Practice Providers (APPs -  Physician Assistants and Nurse Practitioners) who all work together to provide you with the care you need, when you need it.   Your next appointment:   1 year(s)  The format for your next appointment:   In Person  Provider:   Gregg Taylor, MD    Thank you for choosing [facility]!!   [phone]         

## 2022-09-01 NOTE — Progress Notes (Signed)
HPI Christopher Patrick returns today for followup. He is a pleasant 57 yo man with cardiac sarcoid, CHB, VT, and severe LV dysfunction who underwent biv ICD insertion and then had a marked improvement of his cardiac conduction and EF and has not had VT. He remains on CHF therapy and feels well. He denies chest pain or sob. No syncope. No edema. 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 He has not had any ICD therapies despite coming off of amiodarone.  Allergies  Allergen Reactions   Benadryl [Diphenhydramine Hcl (Sleep)] Anaphylaxis   Diphenhydramine Swelling     Current Outpatient Medications  Medication Sig Dispense Refill   carvedilol (COREG) 12.5 MG tablet Take 1 tablet (12.5 mg total) by mouth 2 (two) times daily with a meal. Patient needs appt for any future refills. Please call office to schedule appt.@ [phone] attempt. 60 tablet 0   ramipril (ALTACE) 2.5 MG capsule Take 1 capsule (2.5 mg total) by mouth daily. Patient needs appt for any future refills. Please call office to schedule appt.@ [phone] attempt. 30 capsule 0   furosemide (LASIX) 40 MG tablet Take 1 tablet (40 mg total) by mouth daily as needed (shortness of breath). 90 tablet 3   No current facility-administered medications for this visit.     Past Medical History:  Diagnosis Date   Acute renal insufficiency    a. During adm 03/2014 for VT arrest.   Cardiac arrest [facility])    a. VT arrest with recurrent VT 03/2014, s/p CPR, multiple defib. Ultimately felt 2/2 cardiac sarcoid. Placed on amiodarone. Hospitalization complicated by circulatory shock, VDRF, encephalopathy, AKI, and hiccups which improved by discharge.   Cardiac sarcoidosis    Complete heart block (HCC)    a. s/p St Jude BiV ICD/pacemaker placement w/ BS leads 03/17/14   GERD (gastroesophageal reflux disease)    Non-ischemic cardiomyopathy (HCC)    a. EF15-20% by echo 03/12/14 - s/p BiV-ICD placement. Normal cors 03/2014.    Pulmonary sarcoidosis (HCC)    RBBB    Recurrent ventricular tachycardia (HCC)     ROS:   All systems reviewed and negative except as noted in the HPI.   Past Surgical History:  Procedure Laterality Date   BI-VENTRICULAR IMPLANTABLE CARDIOVERTER DEFIBRILLATOR N/A 03/17/2014   Procedure: BI-VENTRICULAR IMPLANTABLE CARDIOVERTER DEFIBRILLATOR  (CRT-D);  Surgeon: Marinus Maw, MD;  Location: [facility] CATH LAB;  Service: Cardiovascular;  Laterality: N/A;   LEFT HEART CATHETERIZATION WITH CORONARY ANGIOGRAM N/A 03/12/2014   Procedure: LEFT HEART CATHETERIZATION WITH CORONARY ANGIOGRAM;  Surgeon: Iran Ouch, MD;  Location: [facility] CATH LAB;  Service: Cardiovascular;  Laterality: N/A;     Family History  Problem Relation Age of Onset   Stroke Father    Hypertension Maternal Grandfather    Hypertension Maternal Grandmother      Social History   Socioeconomic History   Marital status: Married    Spouse name: Not on file   Number of children: Not on file   Years of education: Not on file   Highest education level: Not on file  Occupational History   Occupation: Emergency planning/management officer  Tobacco Use   Smoking status: Never    Passive exposure: Yes   Smokeless tobacco: Never   Tobacco comments:    second hand as child  Vaping Use   Vaping Use: Never used  Substance and Sexual Activity   Alcohol use: No    Alcohol/week: 0.0 standard drinks of alcohol  Drug use: No   Sexual activity: Not on file  Other Topics Concern   Not on file  Social History Narrative   Not on file   Social Determinants of Health   Financial Resource Strain: Not on file  Food Insecurity: Not on file  Transportation Needs: Not on file  Physical Activity: Not on file  Stress: Not on file  Social Connections: Not on file  Intimate Partner Violence: Not on file     BP (!) 146/82   Pulse (!) 55   Ht 6' (1.829 m)   Wt 228 lb (103.4 kg)   SpO2 95%   BMI 30.92 kg/m   Physical Exam:  Well appearing  NAD HEENT: Unremarkable Neck:  No JVD, no thyromegally Lymphatics:  No adenopathy Back:  No CVA tenderness Lungs:  Clear with no wheezes HEART:  Regular rate rhythm, no murmurs, no rubs, no clicks Abd:  soft, positive bowel sounds, no organomegally, no rebound, no guarding Ext:  2 plus pulses, no edema, no cyanosis, no clubbing Skin:  No rashes no nodules Neuro:  CN II through XII intact, motor grossly intact   DEVICE  Normal device function.  See PaceArt for details.   Assess/Plan: 1. CHB - he is conducting now with no problems. His CHB has resolved. 2. ICD - his biv ICD is working normally. He has not had more VT. He is off of amiodarone 3. VT - he has been arrhythmia free for years.  4. Cardiac sarcoid - no evidence of re-activation. We will follow. I recommended he continue his beta blocker and ACE inhibitor.   Sharlot Gowda Christopher Gillies,MD

## 2022-10-03 ENCOUNTER — Other Ambulatory Visit: Payer: Self-pay | Admitting: *Deleted

## 2022-10-03 DIAGNOSIS — I428 Other cardiomyopathies: Secondary | ICD-10-CM

## 2022-10-03 MED ORDER — RAMIPRIL 2.5 MG PO CAPS
2.5000 mg | ORAL_CAPSULE | Freq: Every day | ORAL | 3 refills | Status: DC
Start: 2022-10-03 — End: 2023-06-08

## 2022-10-03 MED ORDER — CARVEDILOL 12.5 MG PO TABS
12.5000 mg | ORAL_TABLET | Freq: Two times a day (BID) | ORAL | 3 refills | Status: DC
Start: 2022-10-03 — End: 2023-06-08

## 2022-10-03 NOTE — Telephone Encounter (Signed)
Pt calling in today to get Coreg and Ramipril filled. Sent to [facility] per pt's request.

## 2023-01-31 DIAGNOSIS — I480 Paroxysmal atrial fibrillation: Secondary | ICD-10-CM | POA: Diagnosis not present

## 2023-01-31 DIAGNOSIS — Z008 Encounter for other general examination: Secondary | ICD-10-CM | POA: Diagnosis not present

## 2023-05-24 DIAGNOSIS — W908XXS Exposure to other nonionizing radiation, sequela: Secondary | ICD-10-CM | POA: Diagnosis not present

## 2023-05-24 DIAGNOSIS — Z86008 Personal history of in-situ neoplasm of other site: Secondary | ICD-10-CM | POA: Diagnosis not present

## 2023-05-24 DIAGNOSIS — L570 Actinic keratosis: Secondary | ICD-10-CM | POA: Diagnosis not present

## 2023-05-24 DIAGNOSIS — L820 Inflamed seborrheic keratosis: Secondary | ICD-10-CM | POA: Diagnosis not present

## 2023-05-24 DIAGNOSIS — L578 Other skin changes due to chronic exposure to nonionizing radiation: Secondary | ICD-10-CM | POA: Diagnosis not present

## 2023-05-24 DIAGNOSIS — D225 Melanocytic nevi of trunk: Secondary | ICD-10-CM | POA: Diagnosis not present

## 2023-06-06 ENCOUNTER — Telehealth: Payer: Self-pay | Admitting: Internal Medicine

## 2023-06-06 DIAGNOSIS — R002 Palpitations: Secondary | ICD-10-CM | POA: Diagnosis not present

## 2023-06-06 DIAGNOSIS — F5101 Primary insomnia: Secondary | ICD-10-CM | POA: Diagnosis not present

## 2023-06-06 DIAGNOSIS — Z950 Presence of cardiac pacemaker: Secondary | ICD-10-CM | POA: Diagnosis not present

## 2023-06-06 DIAGNOSIS — R079 Chest pain, unspecified: Secondary | ICD-10-CM | POA: Diagnosis not present

## 2023-06-06 DIAGNOSIS — I493 Ventricular premature depolarization: Secondary | ICD-10-CM | POA: Diagnosis not present

## 2023-06-06 DIAGNOSIS — I252 Old myocardial infarction: Secondary | ICD-10-CM | POA: Diagnosis not present

## 2023-06-06 DIAGNOSIS — I4519 Other right bundle-branch block: Secondary | ICD-10-CM | POA: Diagnosis not present

## 2023-06-06 DIAGNOSIS — G479 Sleep disorder, unspecified: Secondary | ICD-10-CM | POA: Diagnosis not present

## 2023-06-06 DIAGNOSIS — G478 Other sleep disorders: Secondary | ICD-10-CM | POA: Diagnosis not present

## 2023-06-06 DIAGNOSIS — I4891 Unspecified atrial fibrillation: Secondary | ICD-10-CM | POA: Diagnosis not present

## 2023-06-06 DIAGNOSIS — R001 Bradycardia, unspecified: Secondary | ICD-10-CM | POA: Diagnosis not present

## 2023-06-06 NOTE — Telephone Encounter (Signed)
 Patient was seen in ER d/t not sleeping well and advised he is having more PVC's per ER MD on EKG. Patient states he wanted to see MD to discuss further as he was told in ER his heart rate was increasing/decreasing while sleeping. Patient does not have a remote monitor. Patient offered next available apt in [place] office 06/08/23 @ 9:20 am. Pt aware of location and directions provided.

## 2023-06-06 NOTE — Telephone Encounter (Signed)
 Pt called in stating he was in the ED last night and he stated they told him his device is not working right. Please advise. Notes are in Care Everywhere with [facility].

## 2023-06-07 NOTE — Progress Notes (Unsigned)
 Electrophysiology Clinic Note    Date:  06/08/2023  Patient ID:  Christopher Patrick, Christopher Patrick 10/19/1965, MRN [id number] PCP:  Loyal Jacobson, MD  Cardiologist:  None HF cardiologist: Shirlee Latch (remotely) Electrophysiologist: Lewayne Bunting, MD    Discussed the use of AI scribe software for clinical note transcription with the patient, who gave verbal consent to proceed.   Patient Profile    Chief Complaint: palpitations  History of Present Illness: Christopher Patrick is a 58 y.o. male with PMH notable for cardiac sarcoid, CHB, VT, s/p CRT-D, HFimpEF; seen today for Lewayne Bunting, MD for acute visit due to "fluttering" in chest.    He last saw Dr. Ladona Ridgel 08/2022, has not seen HF since 2018. At 2024 appt, he was doing well. VT quiescent off amiodarone. His device is programmed VVI. He is not enrolled in remote monitoring per his request.   He presented to [facility] ER 4/1 with complaints of difficult sleeping and fluttering sensation in chest.   He notes the fluttering sensation started about 2 weeks ago, around the same time he has had difficultly sleeping. He is prescribed PRN ambien by PCP, which he was rarely needing but has since been taking it nightly with no improvement in sleep. While in ER, was told by providers that his heart rhythm was abnormal. Labs unremarkable in ER  He denies chest pain, chest pressure, SOB, dizziness or presyncope. Good appetite, no lower extremity edema. He does not check his BP regularly at home.     Arrhythmia/Device History Abbott CRT-D, imp 03/2014 B.Sci RV lead  Amiodarone - stopped 03/2017    ROS:  Please see the history of present illness. All other systems are reviewed and otherwise negative.    Physical Exam    VS:  BP 132/74   Pulse (!) 59   Ht 6' (1.829 m)   Wt 233 lb 3.2 oz (105.8 kg)   BMI 31.63 kg/m  BMI: Body mass index is 31.63 kg/m.  Wt Readings from Last 3 Encounters:  06/08/23 233 lb 3.2 oz (105.8 kg)  09/01/22 228 lb  (103.4 kg)  07/26/21 226 lb (102.5 kg)     GEN- The patient is well appearing, alert and oriented x 3 today.   Lungs- Clear to ausculation bilaterally, normal work of breathing.  Heart- Regular rate and rhythm with ectopy, no murmurs, rubs or gallops Extremities- No peripheral edema, warm, dry Skin-  device pocket well-healed, no tethering   Device interrogation done today and reviewed by myself:  Battery 1.4 years Device programmed VVI at 40 Lead thresholds, impedence, sensing stable  Frequent conducted PAC and PVCs throughout interrogation Several SVT and NSVT episodes, most recently 04/26/23 No changes made today   Studies Reviewed   Previous EP, cardiology notes.    EKG is ordered. Personal review of EKG from today shows:    EKG Interpretation Date/Time:  Thursday June 08 2023 09:32:43 EDT Ventricular Rate:  59 PR Interval:  146 QRS Duration:  96 QT Interval:  428 QTC Calculation: 423 R Axis:   36  Text Interpretation: Sinus bradycardia with Premature supraventricular complexes When compared with ECG of 04-Nov-2016 10:33, Sinus rhythm has replaced Electronic ventricular pacemaker Confirmed by Sherie Don [phone]) on 06/08/2023 9:36:48 AM     TTE, 11/16/2016 - Left ventricle: The cavity size was normal. Wall thickness was    increased in a pattern of mild LVH. Systolic function was normal.    The estimated ejection fraction was in the range  of 60% to 65%.    Wall motion was normal; there were no regional wall motion    abnormalities.  - Left atrium: The atrium was mildly dilated.  - Atrial septum: No defect or patent foramen ovale was identified.   Cardiac MRI, 03/17/2014 1. Normal LV size with EF 46%. Wall motion abnormalities as noted above. 2. Moderately dilated RV with mild to moderate systolic dysfunction, EF 33%. 3. Pattern of LGE as noted above. 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  Assessment and Plan     #) Cardiac sarcoid #) CHB s/p CRT-D #)  PVC #) PAC #) NSVT #) palpitations  He recently went to ER for palpitations and difficulty sleeping, and while on tele in ER was told that his heart rhythm was "abnormal".  Throughout interrogation today, he did not appreciate any "fluttering" sensation though had occasional PVC and PACs seen. I suspect his fluttering sensation he has felt for the past several weeks is more related to sleep disturbance than his arrhythmia.  Interrogation did reveal several NSVT and SVT episodes, no ATP delivered Labs in ER unremarkable, will update TSH, T4 for completeness  Increase coreg to 25mg  BID Update TTE We discussed that if TTE shows worsening of LVEF or worsening of sarcoid, would recommend re-establishing with HF provider, Dr. Shirlee Latch.  He remains uninterested in remote monitoring         Current medicines are reviewed at length with the patient today.   The patient does not have concerns regarding his medicines.  The following changes were made today:   INCREASE coreg to 25mg  twice per day  Labs/ tests ordered today include:  Orders Placed This Encounter  Procedures   TSH   T4, free   EKG 12-Lead   ECHOCARDIOGRAM COMPLETE     Disposition: Follow up with Dr. Ladona Ridgel or EP APP in 3 months   Signed, Sherie Don, NP  06/08/23  10:59 AM  Electrophysiology [facility]

## 2023-06-08 ENCOUNTER — Encounter: Payer: Self-pay | Admitting: Cardiology

## 2023-06-08 ENCOUNTER — Ambulatory Visit: Attending: Cardiology | Admitting: Cardiology

## 2023-06-08 VITALS — BP 132/74 | HR 59 | Ht 72.0 in | Wt 233.2 lb

## 2023-06-08 DIAGNOSIS — I472 Ventricular tachycardia, unspecified: Secondary | ICD-10-CM

## 2023-06-08 DIAGNOSIS — D8685 Sarcoid myocarditis: Secondary | ICD-10-CM | POA: Diagnosis not present

## 2023-06-08 DIAGNOSIS — I442 Atrioventricular block, complete: Secondary | ICD-10-CM | POA: Diagnosis not present

## 2023-06-08 DIAGNOSIS — Z95810 Presence of automatic (implantable) cardiac defibrillator: Secondary | ICD-10-CM | POA: Diagnosis not present

## 2023-06-08 DIAGNOSIS — I428 Other cardiomyopathies: Secondary | ICD-10-CM | POA: Diagnosis not present

## 2023-06-08 LAB — CUP PACEART INCLINIC DEVICE CHECK
Battery Remaining Longevity: 19 mo
Brady Statistic RA Percent Paced: 0 %
Brady Statistic RV Percent Paced: 1.6 %
Date Time Interrogation Session: 20250403114930
HighPow Impedance: 77.625
Implantable Lead Connection Status: 753985
Implantable Lead Connection Status: 753985
Implantable Lead Connection Status: 753985
Implantable Lead Implant Date: 20160111
Implantable Lead Implant Date: 20160111
Implantable Lead Implant Date: 20160111
Implantable Lead Location: 753858
Implantable Lead Location: 753859
Implantable Lead Location: 753860
Implantable Lead Model: 181
Implantable Lead Serial Number: 326242
Implantable Pulse Generator Implant Date: 20160111
Lead Channel Impedance Value: 437.5 Ohm
Lead Channel Impedance Value: 512.5 Ohm
Lead Channel Impedance Value: 587.5 Ohm
Lead Channel Pacing Threshold Amplitude: 1 V
Lead Channel Pacing Threshold Amplitude: 1 V
Lead Channel Pacing Threshold Amplitude: 1 V
Lead Channel Pacing Threshold Amplitude: 1.5 V
Lead Channel Pacing Threshold Amplitude: 1.5 V
Lead Channel Pacing Threshold Pulse Width: 0.5 ms
Lead Channel Pacing Threshold Pulse Width: 0.5 ms
Lead Channel Pacing Threshold Pulse Width: 0.5 ms
Lead Channel Pacing Threshold Pulse Width: 0.5 ms
Lead Channel Pacing Threshold Pulse Width: 0.5 ms
Lead Channel Sensing Intrinsic Amplitude: 11.4 mV
Lead Channel Sensing Intrinsic Amplitude: 5 mV
Lead Channel Setting Pacing Amplitude: 2.5 V
Lead Channel Setting Pacing Amplitude: 2.5 V
Lead Channel Setting Pacing Pulse Width: 0.5 ms
Lead Channel Setting Pacing Pulse Width: 0.5 ms
Lead Channel Setting Sensing Sensitivity: 0.5 mV
Pulse Gen Serial Number: 7210309

## 2023-06-08 MED ORDER — CARVEDILOL 25 MG PO TABS: 25.0000 mg | ORAL_TABLET | Freq: Two times a day (BID) | ORAL | 3 refills | Status: DC

## 2023-06-08 NOTE — Patient Instructions (Signed)
 Medication Instructions:  Increase Coreg to 25 mg ( Take 1 Tablet Twice Daily). *If you need a refill on your cardiac medications before your next appointment, please call your pharmacy*  Lab Work: TSH, T4  If you have labs (blood work) drawn today and your tests are completely normal, you will receive your results only by: MyChart Message (if you have MyChart) OR A paper copy in the mail If you have any lab test that is abnormal or we need to change your treatment, we will call you to review the results.  Testing/Procedures: [address], [address]. Your physician has requested that you have an echocardiogram. Echocardiography is a painless test that uses sound waves to create images of your heart. It provides your doctor with information about the size and shape of your heart and how well your heart's chambers and valves are working. This procedure takes approximately one hour. There are no restrictions for this procedure. Please do NOT wear cologne, perfume, aftershave, or lotions (deodorant is allowed). Please arrive 15 minutes prior to your appointment time.  Please note: We ask at that you not bring children with you during ultrasound (echo/ vascular) testing. Due to room size and safety concerns, children are not allowed in the ultrasound rooms during exams. Our front office staff cannot provide observation of children in our lobby area while testing is being conducted. An adult accompanying a patient to their appointment will only be allowed in the ultrasound room at the discretion of the ultrasound technician under special circumstances. We apologize for any inconvenience.   Follow-Up: At [facility], you and your health needs are our priority.  As part of our continuing mission to provide you with exceptional heart care, our providers are all part of one team.  This team includes your primary Cardiologist (physician) and Advanced Practice Providers or APPs  (Physician Assistants and Nurse Practitioners) who all work together to provide you with the care you need, when you need it.  Your next appointment:   3 month(s)  Provider:   Lewayne Bunting, MD   We recommend signing up for the patient portal called "MyChart".  Sign up information is provided on this After Visit Summary.  MyChart is used to connect with patients for Virtual Visits (Telemedicine).  Patients are able to view lab/test results, encounter notes, upcoming appointments, etc.  Non-urgent messages can be sent to your provider as well.   To learn more about what you can do with MyChart, go to [email or web address].   Other Instructions   1st Floor: - Lobby - Registration  - Pharmacy  - Lab - Cafe  2nd Floor: - PV Lab - Diagnostic Testing (echo, CT, nuclear med)  3rd Floor: - Vacant  4th Floor: - TCTS (cardiothoracic surgery) - AFib Clinic - Structural Heart Clinic - Vascular Surgery  - Vascular Ultrasound  5th Floor: - HeartCare Cardiology (general and EP) - Clinical Pharmacy for coumadin, hypertension, lipid, weight-loss medications, and med management appointments    Valet parking services will be available as well.

## 2023-06-09 LAB — T4, FREE: Free T4: 1.52 ng/dL (ref 0.82–1.77)

## 2023-06-09 LAB — TSH: TSH: 4.99 u[IU]/mL — ABNORMAL HIGH (ref 0.450–4.500)

## 2023-06-12 ENCOUNTER — Ambulatory Visit: Admitting: Physician Assistant

## 2023-07-06 ENCOUNTER — Ambulatory Visit (HOSPITAL_COMMUNITY): Attending: Cardiology

## 2023-07-06 DIAGNOSIS — I442 Atrioventricular block, complete: Secondary | ICD-10-CM | POA: Diagnosis not present

## 2023-07-06 DIAGNOSIS — Z95810 Presence of automatic (implantable) cardiac defibrillator: Secondary | ICD-10-CM | POA: Insufficient documentation

## 2023-07-06 DIAGNOSIS — I472 Ventricular tachycardia, unspecified: Secondary | ICD-10-CM | POA: Diagnosis not present

## 2023-07-06 DIAGNOSIS — I428 Other cardiomyopathies: Secondary | ICD-10-CM

## 2023-07-06 LAB — ECHOCARDIOGRAM COMPLETE
Area-P 1/2: 3.33 cm2
S' Lateral: 2.9 cm

## 2023-07-07 ENCOUNTER — Other Ambulatory Visit (HOSPITAL_COMMUNITY)

## 2023-07-11 DIAGNOSIS — R252 Cramp and spasm: Secondary | ICD-10-CM | POA: Diagnosis not present

## 2023-07-11 DIAGNOSIS — I10 Essential (primary) hypertension: Secondary | ICD-10-CM | POA: Diagnosis not present

## 2023-07-11 DIAGNOSIS — I480 Paroxysmal atrial fibrillation: Secondary | ICD-10-CM | POA: Diagnosis not present

## 2023-07-11 DIAGNOSIS — E039 Hypothyroidism, unspecified: Secondary | ICD-10-CM | POA: Diagnosis not present

## 2023-09-15 ENCOUNTER — Encounter: Payer: Self-pay | Admitting: Internal Medicine

## 2023-09-15 ENCOUNTER — Ambulatory Visit: Attending: Internal Medicine | Admitting: Internal Medicine

## 2023-09-15 VITALS — BP 118/80 | HR 59 | Ht 72.0 in | Wt 232.0 lb

## 2023-09-15 DIAGNOSIS — I472 Ventricular tachycardia, unspecified: Secondary | ICD-10-CM | POA: Diagnosis not present

## 2023-09-15 NOTE — Progress Notes (Signed)
 HPI [name] returns today for followup. He is a pleasant 58 yo man with cardiac sarcoid, CHB, VT, and severe LV dysfunction who underwent biv ICD insertion and then had a marked improvement of his cardiac conduction and EF and has not had VT. He remains on CHF therapy and feels well. He denies chest pain or sob. No syncope. No edema.  He has not had any ICD therapies despite coming off of amiodarone . He was seen in the ED with sinus brady and PVC's.  Allergies  Allergen Reactions   Benadryl [Diphenhydramine Hcl (Sleep)] Anaphylaxis   Diphenhydramine Swelling     Current Outpatient Medications  Medication Sig Dispense Refill   carvedilol  (COREG ) 25 MG tablet Take 1 tablet (25 mg total) by mouth 2 (two) times daily. 180 tablet 3   losartan (COZAAR) 25 MG tablet Take 25 mg by mouth daily.     LUTEIN PO Take 1 tablet by mouth daily.     furosemide  (LASIX ) 40 MG tablet Take 1 tablet (40 mg total) by mouth daily as needed (shortness of breath). 90 tablet 3   No current facility-administered medications for this visit.     Past Medical History:  Diagnosis Date   Acute renal insufficiency    a. During adm 03/2014 for VT arrest.   Cardiac arrest [facility])    a. VT arrest with recurrent VT 03/2014, s/p CPR, multiple defib. Ultimately felt 2/2 cardiac sarcoid. Placed on amiodarone . Hospitalization complicated by circulatory shock, VDRF, encephalopathy, AKI, and hiccups which improved by discharge.   Cardiac sarcoidosis    Complete heart block (HCC)    a. s/p St Jude BiV ICD/pacemaker placement w/ BS leads 03/17/14   GERD (gastroesophageal reflux disease)    Non-ischemic cardiomyopathy (HCC)    a. EF15-20% by echo 03/12/14 - s/p BiV-ICD placement. Normal cors 03/2014.   Pulmonary sarcoidosis (HCC)    RBBB    Recurrent ventricular tachycardia (HCC)     ROS:   All systems reviewed and negative except as noted in the HPI.   Past Surgical History:  Procedure Laterality Date    BI-VENTRICULAR IMPLANTABLE CARDIOVERTER DEFIBRILLATOR N/A 03/17/2014   Procedure: BI-VENTRICULAR IMPLANTABLE CARDIOVERTER DEFIBRILLATOR  (CRT-D);  Surgeon: Christopher LELON Birmingham, MD;  Location: [facility] CATH LAB;  Service: Cardiovascular;  Laterality: N/A;   LEFT HEART CATHETERIZATION WITH CORONARY ANGIOGRAM N/A 03/12/2014   Procedure: LEFT HEART CATHETERIZATION WITH CORONARY ANGIOGRAM;  Surgeon: Deatrice DELENA Cage, MD;  Location: [facility] CATH LAB;  Service: Cardiovascular;  Laterality: N/A;     Family History  Problem Relation Age of Onset   Stroke Father    Hypertension Maternal Grandfather    Hypertension Maternal Grandmother      Social History   Socioeconomic History   Marital status: Married    Spouse name: Not on file   Number of children: Not on file   Years of education: Not on file   Highest education level: Not on file  Occupational History   Occupation: Emergency planning/management officer  Tobacco Use   Smoking status: Never    Passive exposure: Yes   Smokeless tobacco: Never   Tobacco comments:    second hand as child  Vaping Use   Vaping status: Never Used  Substance and Sexual Activity   Alcohol use: No    Alcohol/week: 0.0 standard drinks of alcohol   Drug use: No   Sexual activity: Not on file  Other Topics Concern   Not on file  Social History Narrative  Not on file   Social Drivers of Health   Financial Resource Strain: Not on file  Food Insecurity: Low Risk  (05/24/2022)   Received from [facility]   Hunger Vital Sign    Within the past 12 months, you worried that your food would run out before you got money to buy more: Never true    Within the past 12 months, the food you bought just didn't last and you didn't have money to get more. : Never true  Transportation Needs: Not on file (05/24/2022)  Physical Activity: Not on file  Stress: Not on file  Social Connections: Not on file  Intimate Partner Violence: Not At Risk (06/06/2023)   Received from [facility]   HITS    Over the  last 12 months how often did your partner physically hurt you?: Never    Over the last 12 months how often did your partner insult you or talk down to you?: Never    Over the last 12 months how often did your partner threaten you with physical harm?: Never    Over the last 12 months how often did your partner scream or curse at you?: Never     BP 118/80   Pulse (!) 59   Ht 6' (1.829 m)   Wt 232 lb (105.2 kg)   SpO2 97%   BMI 31.46 kg/m   Physical Exam:  Well appearing NAD HEENT: Unremarkable Neck:  No JVD, no thyromegally Lymphatics:  No adenopathy Back:  No CVA tenderness Lungs:  Clear with no wheezes HEART:  Regular rate rhythm, no murmurs, no rubs, no clicks Abd:  soft, positive bowel sounds, no organomegally, no rebound, no guarding Ext:  2 plus pulses, no edema, no cyanosis, no clubbing Skin:  No rashes no nodules Neuro:  CN II through XII intact, motor grossly intact  EKG - reportedly done but not available in MUSE  DEVICE  Normal device function.  See PaceArt for details.   Assess/Plan:   1.CHB - he is conducting now with no problems. His CHB has resolved we paced him in the atrium and he conducted at 100/min. 2. ICD - his biv ICD is working normally. He has not had more VT. He is off of amiodarone  3. VT - he has been arrhythmia free for years. Because of some symptomatic PVC's we tried turning his pacing rate to 70/min and his PVC's appeared to suppress. 4. Cardiac sarcoid - no evidence of re-activation. We will follow. I recommended he continue his beta blocker and ACE inhibitor.   Christopher Adaiah Jaskot,MD

## 2024-01-15 ENCOUNTER — Encounter: Payer: Self-pay | Admitting: Cardiology

## 2024-01-15 ENCOUNTER — Ambulatory Visit: Attending: Student | Admitting: Cardiology

## 2024-01-15 VITALS — BP 138/80 | HR 70 | Ht 72.0 in | Wt 234.2 lb

## 2024-01-15 DIAGNOSIS — I428 Other cardiomyopathies: Secondary | ICD-10-CM

## 2024-01-15 DIAGNOSIS — Z95810 Presence of automatic (implantable) cardiac defibrillator: Secondary | ICD-10-CM | POA: Diagnosis not present

## 2024-01-15 DIAGNOSIS — D8685 Sarcoid myocarditis: Secondary | ICD-10-CM

## 2024-01-15 DIAGNOSIS — I472 Ventricular tachycardia, unspecified: Secondary | ICD-10-CM | POA: Diagnosis not present

## 2024-01-15 LAB — CUP PACEART INCLINIC DEVICE CHECK
Battery Remaining Longevity: 7 mo
Brady Statistic RA Percent Paced: 95 %
Brady Statistic RV Percent Paced: 0.51 %
Date Time Interrogation Session: 20251110123136
HighPow Impedance: 81 Ohm
Implantable Lead Connection Status: 753985
Implantable Lead Connection Status: 753985
Implantable Lead Connection Status: 753985
Implantable Lead Implant Date: 20160111
Implantable Lead Implant Date: 20160111
Implantable Lead Implant Date: 20160111
Implantable Lead Location: 753858
Implantable Lead Location: 753859
Implantable Lead Location: 753860
Implantable Lead Model: 181
Implantable Lead Serial Number: 326242
Implantable Pulse Generator Implant Date: 20160111
Lead Channel Impedance Value: 437.5 Ohm
Lead Channel Impedance Value: 475 Ohm
Lead Channel Impedance Value: 625 Ohm
Lead Channel Pacing Threshold Amplitude: 0.75 V
Lead Channel Pacing Threshold Amplitude: 0.75 V
Lead Channel Pacing Threshold Amplitude: 1 V
Lead Channel Pacing Threshold Amplitude: 1 V
Lead Channel Pacing Threshold Amplitude: 1.5 V
Lead Channel Pacing Threshold Amplitude: 1.5 V
Lead Channel Pacing Threshold Pulse Width: 0.5 ms
Lead Channel Pacing Threshold Pulse Width: 0.5 ms
Lead Channel Pacing Threshold Pulse Width: 0.5 ms
Lead Channel Pacing Threshold Pulse Width: 0.5 ms
Lead Channel Pacing Threshold Pulse Width: 0.5 ms
Lead Channel Pacing Threshold Pulse Width: 0.5 ms
Lead Channel Sensing Intrinsic Amplitude: 11.4 mV
Lead Channel Sensing Intrinsic Amplitude: 4.5 mV
Lead Channel Setting Pacing Amplitude: 2.25 V
Lead Channel Setting Pacing Amplitude: 2.5 V
Lead Channel Setting Pacing Amplitude: 2.5 V
Lead Channel Setting Pacing Pulse Width: 0.5 ms
Lead Channel Setting Pacing Pulse Width: 0.5 ms
Lead Channel Setting Sensing Sensitivity: 0.5 mV
Pulse Gen Serial Number: 7210309

## 2024-01-15 NOTE — Patient Instructions (Signed)
 Medication Instructions:  Your physician recommends that you continue on your current medications as directed. Please refer to the Current Medication list given to you today.  *If you need a refill on your cardiac medications before your next appointment, please call your pharmacy*  Lab Work: None ordered If you have labs (blood work) drawn today and your tests are completely normal, you will receive your results only by: MyChart Message (if you have MyChart) OR A paper copy in the mail If you have any lab test that is abnormal or we need to change your treatment, we will call you to review the results.  Follow-Up: At [facility], you and your health needs are our priority.  As part of our continuing mission to provide you with exceptional heart care, our providers are all part of one team.  This team includes your primary Cardiologist (physician) and Advanced Practice Providers or APPs (Physician Assistants and Nurse Practitioners) who all work together to provide you with the care you need, when you need it.  Your next appointment:   3 month(s) with EP APP or device clinic  Provider:   You will see one of the following Advanced Practice Providers on your designated Care Team:   Charlies Arthur, [place] Ozell Jodie Passey, PA-C Suzann Riddle, NP Daphne Barrack, NP Artist Pouch, PA-C

## 2024-01-15 NOTE — Progress Notes (Addendum)
  Electrophysiology Office Note:   ID:  Christopher, Patrick 1965/04/18, MRN [id number]  Primary Cardiologist: None Electrophysiologist: Danelle Birmingham, MD      History of Present Illness:   Christopher Patrick is a 58 y.o. male with h/o cardiac sarcoidosis, complete heart block, VT, severe LV dysfunction s/p CRT-D, HFimpEF, PVCs seen today for routine electrophysiology followup.   Patient previously on Amiodarone  but this was discontinued after placement of ICD.   Since last being seen in our clinic the patient reports doing well.  he denies chest pain, palpitations, dyspnea, PND, orthopnea, nausea, vomiting, dizziness, syncope, edema, weight gain, or early satiety.   Review of systems complete and found to be negative unless listed in HPI.   EP Information / Studies Reviewed:    EKG is ordered today. Personal review as below.  EKG Interpretation Date/Time:  Monday January 15 2024 11:46:01 EST Ventricular Rate:  70 PR Interval:  186 QRS Duration:  104 QT Interval:  388 QTC Calculation: 419 R Axis:   91  Text Interpretation: Atrial-paced rhythm Rightward axis Low voltage QRS Incomplete right bundle branch block Confirmed by Trudy Birmingham [phone]) on 01/15/2024 11:53:15 AM    ICD Interrogation-  reviewed in detail today,  See PACEART report.  Arrhythmia/Device History Abbott CRT-D, imp 03/2014 B.Sci RV lead (GORE lead)   Amiodarone  - stopped 03/2017   Physical Exam:   VS:  BP 138/80   Pulse 70   Ht 6' (1.829 m)   Wt 234 lb 3.2 oz (106.2 kg)   SpO2 98%   BMI 31.76 kg/m    Wt Readings from Last 3 Encounters:  01/15/24 234 lb 3.2 oz (106.2 kg)  09/15/23 232 lb (105.2 kg)  06/08/23 233 lb 3.2 oz (105.8 kg)     GEN: No acute distress  NECK: No JVD; No carotid bruits CARDIAC: Regular rate and rhythm, no murmurs, rubs, gallops RESPIRATORY:  Clear to auscultation without rales, wheezing or rhonchi  ABDOMEN: Soft, non-tender, non-distended EXTREMITIES:  No edema; No  deformity   ASSESSMENT AND PLAN:    Chronic systolic CHF  s/p Abbott CRT-D  Cardiac sarcoidosis euvolemic today Stable on an appropriate medical regimen Normal ICD function. Presenting rhythm AP-VS. BiV pacing less than 1% but ECG with narrow QRS. Estimated 6 months to ERI. Not enrolled in remote clinic due to cost, will bring back in 3 months.  See Elisabeth Art report No changes today  VT PVCs Isolated episode of NSVT on interrogation. PVC burden <1% since change to DDD with min rate 70 bpm.   Atrial tachycardia Brief episodes of AT, 1:1 conduction on device. No symptoms per patient.  Disposition:   Follow up with EP Team in 3 months   Signed, Birmingham Trudy, PA-C

## 2024-03-29 ENCOUNTER — Other Ambulatory Visit: Payer: Self-pay

## 2024-04-05 MED ORDER — FUROSEMIDE 40 MG PO TABS: 40.0000 mg | ORAL_TABLET | Freq: Every day | ORAL | 3 refills | Status: AC | PRN

## 2024-04-05 MED ORDER — CARVEDILOL 25 MG PO TABS: 25.0000 mg | ORAL_TABLET | Freq: Two times a day (BID) | ORAL | 3 refills | Status: AC

## 2024-04-05 NOTE — Telephone Encounter (Signed)
 In accordance with refill protocols, please review and address the following requirements before this medication refill can be authorized:  Labs  Pt had labs done on 06/06/2023, all within normal range in careeverywhere, within 365 days. Medications sent to pt's pharmacy

## 2024-04-16 ENCOUNTER — Ambulatory Visit: Admitting: Pulmonary Disease
